# Patient Record
Sex: Female | Born: 1970
Health system: Southern US, Community
[De-identification: ages and names within clinical notes are randomized; demographics above are authoritative.]

## PROBLEM LIST (undated history)

## (undated) DIAGNOSIS — K2270 Barrett's esophagus without dysplasia: Secondary | ICD-10-CM

## (undated) DIAGNOSIS — M5126 Other intervertebral disc displacement, lumbar region: Secondary | ICD-10-CM

## (undated) DIAGNOSIS — D649 Anemia, unspecified: Secondary | ICD-10-CM

## (undated) DIAGNOSIS — M810 Age-related osteoporosis without current pathological fracture: Secondary | ICD-10-CM

## (undated) DIAGNOSIS — G43909 Migraine, unspecified, not intractable, without status migrainosus: Secondary | ICD-10-CM

## (undated) DIAGNOSIS — K449 Diaphragmatic hernia without obstruction or gangrene: Secondary | ICD-10-CM

## (undated) DIAGNOSIS — D689 Coagulation defect, unspecified: Secondary | ICD-10-CM

## (undated) DIAGNOSIS — J45909 Unspecified asthma, uncomplicated: Secondary | ICD-10-CM

## (undated) DIAGNOSIS — M51369 Other intervertebral disc degeneration, lumbar region without mention of lumbar back pain or lower extremity pain: Secondary | ICD-10-CM

## (undated) DIAGNOSIS — F329 Major depressive disorder, single episode, unspecified: Secondary | ICD-10-CM

## (undated) DIAGNOSIS — F909 Attention-deficit hyperactivity disorder, unspecified type: Secondary | ICD-10-CM

## (undated) DIAGNOSIS — I10 Essential (primary) hypertension: Secondary | ICD-10-CM

## (undated) DIAGNOSIS — M1990 Unspecified osteoarthritis, unspecified site: Secondary | ICD-10-CM

## (undated) DIAGNOSIS — B977 Papillomavirus as the cause of diseases classified elsewhere: Secondary | ICD-10-CM

## (undated) DIAGNOSIS — F419 Anxiety disorder, unspecified: Secondary | ICD-10-CM

## (undated) DIAGNOSIS — F32A Depression, unspecified: Secondary | ICD-10-CM

## (undated) DIAGNOSIS — K219 Gastro-esophageal reflux disease without esophagitis: Secondary | ICD-10-CM

## (undated) DIAGNOSIS — N809 Endometriosis, unspecified: Secondary | ICD-10-CM

## (undated) DIAGNOSIS — T7840XA Allergy, unspecified, initial encounter: Secondary | ICD-10-CM

## (undated) DIAGNOSIS — M5136 Other intervertebral disc degeneration, lumbar region: Secondary | ICD-10-CM

## (undated) HISTORY — DX: Unspecified asthma, uncomplicated: J45.909

## (undated) HISTORY — PX: ESSURE TUBAL LIGATION: SUR464

## (undated) HISTORY — PX: HEEL SPUR EXCISION: SHX1733

## (undated) HISTORY — DX: Gastro-esophageal reflux disease without esophagitis: K21.9

## (undated) HISTORY — DX: Migraine, unspecified, not intractable, without status migrainosus: G43.909

## (undated) HISTORY — PX: TONSILLECTOMY: SUR1361

## (undated) HISTORY — DX: Allergy, unspecified, initial encounter: T78.40XA

## (undated) HISTORY — DX: Coagulation defect, unspecified: D68.9

## (undated) HISTORY — DX: Anxiety disorder, unspecified: F41.9

## (undated) HISTORY — DX: Depression, unspecified: F32.A

## (undated) HISTORY — DX: Unspecified osteoarthritis, unspecified site: M19.90

## (undated) HISTORY — DX: Other intervertebral disc degeneration, lumbar region without mention of lumbar back pain or lower extremity pain: M51.369

## (undated) HISTORY — DX: Age-related osteoporosis without current pathological fracture: M81.0

## (undated) HISTORY — DX: Papillomavirus as the cause of diseases classified elsewhere: B97.7

## (undated) HISTORY — DX: Diaphragmatic hernia without obstruction or gangrene: K44.9

## (undated) HISTORY — PX: JOINT REPLACEMENT: SHX530

## (undated) HISTORY — DX: Barrett's esophagus without dysplasia: K22.70

## (undated) HISTORY — DX: Essential (primary) hypertension: I10

## (undated) HISTORY — DX: Anemia, unspecified: D64.9

## (undated) HISTORY — DX: Other intervertebral disc degeneration, lumbar region: M51.36

## (undated) HISTORY — DX: Endometriosis, unspecified: N80.9

## (undated) HISTORY — DX: Major depressive disorder, single episode, unspecified: F32.9

## (undated) HISTORY — DX: Other intervertebral disc displacement, lumbar region: M51.26

## (undated) HISTORY — DX: Attention-deficit hyperactivity disorder, unspecified type: F90.9

## (undated) HISTORY — PX: ARTHROSCOPIC REPAIR ACL: SUR80

## (undated) HISTORY — PX: GASTRIC BYPASS: SHX52

## (undated) HISTORY — PX: CARPAL TUNNEL RELEASE: SHX101

---

## 2012-06-14 DIAGNOSIS — G5600 Carpal tunnel syndrome, unspecified upper limb: Secondary | ICD-10-CM | POA: Insufficient documentation

## 2013-02-17 DIAGNOSIS — M62838 Other muscle spasm: Secondary | ICD-10-CM | POA: Insufficient documentation

## 2013-05-19 DIAGNOSIS — M6528 Calcific tendinitis, other site: Secondary | ICD-10-CM | POA: Insufficient documentation

## 2013-05-19 DIAGNOSIS — M2141 Flat foot [pes planus] (acquired), right foot: Secondary | ICD-10-CM | POA: Insufficient documentation

## 2013-05-19 DIAGNOSIS — M2142 Flat foot [pes planus] (acquired), left foot: Secondary | ICD-10-CM

## 2013-06-19 DIAGNOSIS — K148 Other diseases of tongue: Secondary | ICD-10-CM | POA: Insufficient documentation

## 2013-06-30 DIAGNOSIS — R0600 Dyspnea, unspecified: Secondary | ICD-10-CM | POA: Insufficient documentation

## 2013-06-30 DIAGNOSIS — M25519 Pain in unspecified shoulder: Secondary | ICD-10-CM | POA: Insufficient documentation

## 2013-06-30 DIAGNOSIS — Z6841 Body Mass Index (BMI) 40.0 and over, adult: Secondary | ICD-10-CM

## 2013-06-30 DIAGNOSIS — R609 Edema, unspecified: Secondary | ICD-10-CM | POA: Insufficient documentation

## 2013-08-19 DIAGNOSIS — M25579 Pain in unspecified ankle and joints of unspecified foot: Secondary | ICD-10-CM | POA: Insufficient documentation

## 2014-09-24 DIAGNOSIS — G8929 Other chronic pain: Secondary | ICD-10-CM | POA: Insufficient documentation

## 2014-09-24 DIAGNOSIS — M1712 Unilateral primary osteoarthritis, left knee: Secondary | ICD-10-CM | POA: Insufficient documentation

## 2014-09-24 DIAGNOSIS — M25559 Pain in unspecified hip: Secondary | ICD-10-CM

## 2015-01-04 DIAGNOSIS — Z9884 Bariatric surgery status: Secondary | ICD-10-CM | POA: Insufficient documentation

## 2015-01-04 DIAGNOSIS — Z713 Dietary counseling and surveillance: Secondary | ICD-10-CM | POA: Insufficient documentation

## 2015-01-29 DIAGNOSIS — M1612 Unilateral primary osteoarthritis, left hip: Secondary | ICD-10-CM | POA: Insufficient documentation

## 2016-01-04 DIAGNOSIS — J302 Other seasonal allergic rhinitis: Secondary | ICD-10-CM | POA: Insufficient documentation

## 2016-04-10 DIAGNOSIS — M542 Cervicalgia: Secondary | ICD-10-CM | POA: Diagnosis not present

## 2016-04-10 DIAGNOSIS — R202 Paresthesia of skin: Secondary | ICD-10-CM | POA: Diagnosis not present

## 2016-04-10 DIAGNOSIS — M25559 Pain in unspecified hip: Secondary | ICD-10-CM | POA: Diagnosis not present

## 2016-04-19 DIAGNOSIS — R140 Abdominal distension (gaseous): Secondary | ICD-10-CM | POA: Diagnosis not present

## 2016-04-19 DIAGNOSIS — R110 Nausea: Secondary | ICD-10-CM | POA: Diagnosis not present

## 2016-04-19 DIAGNOSIS — Z9884 Bariatric surgery status: Secondary | ICD-10-CM | POA: Diagnosis not present

## 2016-04-19 DIAGNOSIS — R109 Unspecified abdominal pain: Secondary | ICD-10-CM | POA: Diagnosis not present

## 2016-04-19 DIAGNOSIS — K219 Gastro-esophageal reflux disease without esophagitis: Secondary | ICD-10-CM | POA: Diagnosis not present

## 2016-04-19 DIAGNOSIS — R1084 Generalized abdominal pain: Secondary | ICD-10-CM | POA: Insufficient documentation

## 2016-04-19 DIAGNOSIS — I10 Essential (primary) hypertension: Secondary | ICD-10-CM | POA: Diagnosis not present

## 2016-04-19 DIAGNOSIS — R1031 Right lower quadrant pain: Secondary | ICD-10-CM | POA: Diagnosis not present

## 2016-05-15 DIAGNOSIS — Z9884 Bariatric surgery status: Secondary | ICD-10-CM | POA: Diagnosis not present

## 2016-05-15 DIAGNOSIS — Z48815 Encounter for surgical aftercare following surgery on the digestive system: Secondary | ICD-10-CM | POA: Diagnosis not present

## 2016-05-15 DIAGNOSIS — Z87891 Personal history of nicotine dependence: Secondary | ICD-10-CM | POA: Diagnosis not present

## 2016-05-15 DIAGNOSIS — Z87898 Personal history of other specified conditions: Secondary | ICD-10-CM | POA: Diagnosis not present

## 2016-05-15 DIAGNOSIS — Z6837 Body mass index (BMI) 37.0-37.9, adult: Secondary | ICD-10-CM | POA: Diagnosis not present

## 2016-05-15 DIAGNOSIS — R1013 Epigastric pain: Secondary | ICD-10-CM | POA: Diagnosis not present

## 2016-05-16 DIAGNOSIS — Z9884 Bariatric surgery status: Secondary | ICD-10-CM | POA: Diagnosis not present

## 2016-06-07 DIAGNOSIS — M25562 Pain in left knee: Secondary | ICD-10-CM | POA: Diagnosis not present

## 2016-06-07 DIAGNOSIS — M47812 Spondylosis without myelopathy or radiculopathy, cervical region: Secondary | ICD-10-CM | POA: Diagnosis not present

## 2016-06-07 DIAGNOSIS — M4802 Spinal stenosis, cervical region: Secondary | ICD-10-CM | POA: Diagnosis not present

## 2016-06-07 DIAGNOSIS — M25511 Pain in right shoulder: Secondary | ICD-10-CM | POA: Diagnosis not present

## 2016-06-18 DIAGNOSIS — K429 Umbilical hernia without obstruction or gangrene: Secondary | ICD-10-CM | POA: Diagnosis not present

## 2016-06-18 DIAGNOSIS — R1013 Epigastric pain: Secondary | ICD-10-CM | POA: Diagnosis not present

## 2016-06-18 DIAGNOSIS — R110 Nausea: Secondary | ICD-10-CM | POA: Diagnosis not present

## 2016-06-18 DIAGNOSIS — Z888 Allergy status to other drugs, medicaments and biological substances status: Secondary | ICD-10-CM | POA: Diagnosis not present

## 2016-06-18 DIAGNOSIS — Z8249 Family history of ischemic heart disease and other diseases of the circulatory system: Secondary | ICD-10-CM | POA: Diagnosis not present

## 2016-06-18 DIAGNOSIS — D72829 Elevated white blood cell count, unspecified: Secondary | ICD-10-CM | POA: Diagnosis not present

## 2016-06-18 DIAGNOSIS — Z886 Allergy status to analgesic agent status: Secondary | ICD-10-CM | POA: Diagnosis not present

## 2016-06-18 DIAGNOSIS — Z885 Allergy status to narcotic agent status: Secondary | ICD-10-CM | POA: Diagnosis not present

## 2016-06-18 DIAGNOSIS — R1010 Upper abdominal pain, unspecified: Secondary | ICD-10-CM | POA: Diagnosis not present

## 2016-06-18 DIAGNOSIS — Z79899 Other long term (current) drug therapy: Secondary | ICD-10-CM | POA: Diagnosis not present

## 2016-06-18 DIAGNOSIS — Z91040 Latex allergy status: Secondary | ICD-10-CM | POA: Diagnosis not present

## 2016-06-18 DIAGNOSIS — R933 Abnormal findings on diagnostic imaging of other parts of digestive tract: Secondary | ICD-10-CM | POA: Diagnosis not present

## 2016-06-18 DIAGNOSIS — R109 Unspecified abdominal pain: Secondary | ICD-10-CM | POA: Diagnosis not present

## 2016-06-27 DIAGNOSIS — M4802 Spinal stenosis, cervical region: Secondary | ICD-10-CM | POA: Diagnosis not present

## 2016-06-27 DIAGNOSIS — M5412 Radiculopathy, cervical region: Secondary | ICD-10-CM | POA: Diagnosis not present

## 2016-06-27 DIAGNOSIS — M50323 Other cervical disc degeneration at C6-C7 level: Secondary | ICD-10-CM | POA: Diagnosis not present

## 2016-06-27 DIAGNOSIS — M50322 Other cervical disc degeneration at C5-C6 level: Secondary | ICD-10-CM | POA: Diagnosis not present

## 2016-07-07 DIAGNOSIS — M25512 Pain in left shoulder: Secondary | ICD-10-CM | POA: Diagnosis not present

## 2016-07-07 DIAGNOSIS — M25511 Pain in right shoulder: Secondary | ICD-10-CM | POA: Diagnosis not present

## 2016-07-13 DIAGNOSIS — M5481 Occipital neuralgia: Secondary | ICD-10-CM | POA: Diagnosis not present

## 2016-07-13 DIAGNOSIS — M62838 Other muscle spasm: Secondary | ICD-10-CM | POA: Diagnosis not present

## 2016-07-21 DIAGNOSIS — M25562 Pain in left knee: Secondary | ICD-10-CM | POA: Diagnosis not present

## 2016-07-31 DIAGNOSIS — Q666 Other congenital valgus deformities of feet: Secondary | ICD-10-CM | POA: Diagnosis not present

## 2016-08-04 DIAGNOSIS — M25562 Pain in left knee: Secondary | ICD-10-CM | POA: Diagnosis not present

## 2016-08-04 DIAGNOSIS — M25552 Pain in left hip: Secondary | ICD-10-CM | POA: Diagnosis not present

## 2016-08-04 DIAGNOSIS — M62838 Other muscle spasm: Secondary | ICD-10-CM | POA: Diagnosis not present

## 2016-08-04 DIAGNOSIS — M5412 Radiculopathy, cervical region: Secondary | ICD-10-CM | POA: Diagnosis not present

## 2016-08-07 DIAGNOSIS — Z201 Contact with and (suspected) exposure to tuberculosis: Secondary | ICD-10-CM | POA: Diagnosis not present

## 2016-08-11 DIAGNOSIS — M2141 Flat foot [pes planus] (acquired), right foot: Secondary | ICD-10-CM | POA: Diagnosis not present

## 2016-08-11 DIAGNOSIS — Q666 Other congenital valgus deformities of feet: Secondary | ICD-10-CM | POA: Diagnosis not present

## 2016-09-18 DIAGNOSIS — M9902 Segmental and somatic dysfunction of thoracic region: Secondary | ICD-10-CM | POA: Diagnosis not present

## 2016-09-18 DIAGNOSIS — M9901 Segmental and somatic dysfunction of cervical region: Secondary | ICD-10-CM | POA: Diagnosis not present

## 2016-09-18 DIAGNOSIS — M9903 Segmental and somatic dysfunction of lumbar region: Secondary | ICD-10-CM | POA: Diagnosis not present

## 2016-10-04 DIAGNOSIS — M9906 Segmental and somatic dysfunction of lower extremity: Secondary | ICD-10-CM | POA: Diagnosis not present

## 2016-10-04 DIAGNOSIS — M9902 Segmental and somatic dysfunction of thoracic region: Secondary | ICD-10-CM | POA: Diagnosis not present

## 2016-10-04 DIAGNOSIS — M9903 Segmental and somatic dysfunction of lumbar region: Secondary | ICD-10-CM | POA: Diagnosis not present

## 2016-10-04 DIAGNOSIS — M9901 Segmental and somatic dysfunction of cervical region: Secondary | ICD-10-CM | POA: Diagnosis not present

## 2016-10-13 DIAGNOSIS — R933 Abnormal findings on diagnostic imaging of other parts of digestive tract: Secondary | ICD-10-CM | POA: Diagnosis not present

## 2016-10-13 DIAGNOSIS — Z1231 Encounter for screening mammogram for malignant neoplasm of breast: Secondary | ICD-10-CM | POA: Diagnosis not present

## 2016-10-19 DIAGNOSIS — M9902 Segmental and somatic dysfunction of thoracic region: Secondary | ICD-10-CM | POA: Diagnosis not present

## 2016-10-19 DIAGNOSIS — M1712 Unilateral primary osteoarthritis, left knee: Secondary | ICD-10-CM | POA: Diagnosis not present

## 2016-10-19 DIAGNOSIS — M9901 Segmental and somatic dysfunction of cervical region: Secondary | ICD-10-CM | POA: Diagnosis not present

## 2016-10-19 DIAGNOSIS — M779 Enthesopathy, unspecified: Secondary | ICD-10-CM | POA: Diagnosis not present

## 2016-10-19 DIAGNOSIS — M25562 Pain in left knee: Secondary | ICD-10-CM | POA: Diagnosis not present

## 2016-10-19 DIAGNOSIS — M9903 Segmental and somatic dysfunction of lumbar region: Secondary | ICD-10-CM | POA: Diagnosis not present

## 2016-10-19 DIAGNOSIS — M25462 Effusion, left knee: Secondary | ICD-10-CM | POA: Diagnosis not present

## 2016-10-19 DIAGNOSIS — M9906 Segmental and somatic dysfunction of lower extremity: Secondary | ICD-10-CM | POA: Diagnosis not present

## 2016-10-26 DIAGNOSIS — M9901 Segmental and somatic dysfunction of cervical region: Secondary | ICD-10-CM | POA: Diagnosis not present

## 2016-10-26 DIAGNOSIS — M9906 Segmental and somatic dysfunction of lower extremity: Secondary | ICD-10-CM | POA: Diagnosis not present

## 2016-10-26 DIAGNOSIS — M9902 Segmental and somatic dysfunction of thoracic region: Secondary | ICD-10-CM | POA: Diagnosis not present

## 2016-10-26 DIAGNOSIS — M9903 Segmental and somatic dysfunction of lumbar region: Secondary | ICD-10-CM | POA: Diagnosis not present

## 2016-11-16 DIAGNOSIS — M9906 Segmental and somatic dysfunction of lower extremity: Secondary | ICD-10-CM | POA: Diagnosis not present

## 2016-11-16 DIAGNOSIS — M9902 Segmental and somatic dysfunction of thoracic region: Secondary | ICD-10-CM | POA: Diagnosis not present

## 2016-11-16 DIAGNOSIS — M9901 Segmental and somatic dysfunction of cervical region: Secondary | ICD-10-CM | POA: Diagnosis not present

## 2016-11-16 DIAGNOSIS — M9903 Segmental and somatic dysfunction of lumbar region: Secondary | ICD-10-CM | POA: Diagnosis not present

## 2016-11-20 DIAGNOSIS — M1612 Unilateral primary osteoarthritis, left hip: Secondary | ICD-10-CM | POA: Diagnosis not present

## 2016-11-20 DIAGNOSIS — M160 Bilateral primary osteoarthritis of hip: Secondary | ICD-10-CM | POA: Diagnosis not present

## 2016-11-20 DIAGNOSIS — M1732 Unilateral post-traumatic osteoarthritis, left knee: Secondary | ICD-10-CM | POA: Diagnosis not present

## 2016-11-20 DIAGNOSIS — M25552 Pain in left hip: Secondary | ICD-10-CM | POA: Diagnosis not present

## 2016-11-20 DIAGNOSIS — Z6837 Body mass index (BMI) 37.0-37.9, adult: Secondary | ICD-10-CM | POA: Diagnosis not present

## 2016-11-22 DIAGNOSIS — M9903 Segmental and somatic dysfunction of lumbar region: Secondary | ICD-10-CM | POA: Diagnosis not present

## 2016-11-22 DIAGNOSIS — M9901 Segmental and somatic dysfunction of cervical region: Secondary | ICD-10-CM | POA: Diagnosis not present

## 2016-11-22 DIAGNOSIS — M9902 Segmental and somatic dysfunction of thoracic region: Secondary | ICD-10-CM | POA: Diagnosis not present

## 2016-11-22 DIAGNOSIS — M9906 Segmental and somatic dysfunction of lower extremity: Secondary | ICD-10-CM | POA: Diagnosis not present

## 2016-11-30 DIAGNOSIS — M9902 Segmental and somatic dysfunction of thoracic region: Secondary | ICD-10-CM | POA: Diagnosis not present

## 2016-11-30 DIAGNOSIS — M9901 Segmental and somatic dysfunction of cervical region: Secondary | ICD-10-CM | POA: Diagnosis not present

## 2016-11-30 DIAGNOSIS — M9906 Segmental and somatic dysfunction of lower extremity: Secondary | ICD-10-CM | POA: Diagnosis not present

## 2016-11-30 DIAGNOSIS — M9903 Segmental and somatic dysfunction of lumbar region: Secondary | ICD-10-CM | POA: Diagnosis not present

## 2016-12-05 DIAGNOSIS — M9902 Segmental and somatic dysfunction of thoracic region: Secondary | ICD-10-CM | POA: Diagnosis not present

## 2016-12-05 DIAGNOSIS — M9901 Segmental and somatic dysfunction of cervical region: Secondary | ICD-10-CM | POA: Diagnosis not present

## 2016-12-05 DIAGNOSIS — M9903 Segmental and somatic dysfunction of lumbar region: Secondary | ICD-10-CM | POA: Diagnosis not present

## 2016-12-05 DIAGNOSIS — M9906 Segmental and somatic dysfunction of lower extremity: Secondary | ICD-10-CM | POA: Diagnosis not present

## 2016-12-08 DIAGNOSIS — M5481 Occipital neuralgia: Secondary | ICD-10-CM | POA: Diagnosis not present

## 2016-12-08 DIAGNOSIS — M62838 Other muscle spasm: Secondary | ICD-10-CM | POA: Diagnosis not present

## 2016-12-21 LAB — HM PAP SMEAR: HM Pap smear: NORMAL

## 2016-12-21 LAB — HM HIV SCREENING LAB: HM HIV Screening: NEGATIVE

## 2016-12-22 DIAGNOSIS — Z1151 Encounter for screening for human papillomavirus (HPV): Secondary | ICD-10-CM | POA: Diagnosis not present

## 2016-12-22 DIAGNOSIS — F419 Anxiety disorder, unspecified: Secondary | ICD-10-CM | POA: Diagnosis not present

## 2016-12-22 DIAGNOSIS — Z9884 Bariatric surgery status: Secondary | ICD-10-CM | POA: Diagnosis not present

## 2016-12-22 DIAGNOSIS — Z0000 Encounter for general adult medical examination without abnormal findings: Secondary | ICD-10-CM | POA: Diagnosis not present

## 2016-12-22 DIAGNOSIS — Z118 Encounter for screening for other infectious and parasitic diseases: Secondary | ICD-10-CM | POA: Diagnosis not present

## 2016-12-22 DIAGNOSIS — R51 Headache: Secondary | ICD-10-CM | POA: Diagnosis not present

## 2016-12-22 DIAGNOSIS — Z124 Encounter for screening for malignant neoplasm of cervix: Secondary | ICD-10-CM | POA: Diagnosis not present

## 2016-12-22 DIAGNOSIS — Z6836 Body mass index (BMI) 36.0-36.9, adult: Secondary | ICD-10-CM | POA: Diagnosis not present

## 2016-12-22 DIAGNOSIS — D509 Iron deficiency anemia, unspecified: Secondary | ICD-10-CM | POA: Diagnosis not present

## 2016-12-22 DIAGNOSIS — Z113 Encounter for screening for infections with a predominantly sexual mode of transmission: Secondary | ICD-10-CM | POA: Diagnosis not present

## 2016-12-22 DIAGNOSIS — M25562 Pain in left knee: Secondary | ICD-10-CM | POA: Diagnosis not present

## 2016-12-22 DIAGNOSIS — N920 Excessive and frequent menstruation with regular cycle: Secondary | ICD-10-CM | POA: Diagnosis not present

## 2016-12-22 DIAGNOSIS — Z13220 Encounter for screening for lipoid disorders: Secondary | ICD-10-CM | POA: Diagnosis not present

## 2016-12-22 DIAGNOSIS — E559 Vitamin D deficiency, unspecified: Secondary | ICD-10-CM | POA: Diagnosis not present

## 2016-12-26 DIAGNOSIS — M9903 Segmental and somatic dysfunction of lumbar region: Secondary | ICD-10-CM | POA: Diagnosis not present

## 2016-12-26 DIAGNOSIS — M9902 Segmental and somatic dysfunction of thoracic region: Secondary | ICD-10-CM | POA: Diagnosis not present

## 2016-12-26 DIAGNOSIS — M9906 Segmental and somatic dysfunction of lower extremity: Secondary | ICD-10-CM | POA: Diagnosis not present

## 2016-12-26 DIAGNOSIS — M9901 Segmental and somatic dysfunction of cervical region: Secondary | ICD-10-CM | POA: Diagnosis not present

## 2017-01-10 DIAGNOSIS — M9901 Segmental and somatic dysfunction of cervical region: Secondary | ICD-10-CM | POA: Diagnosis not present

## 2017-01-10 DIAGNOSIS — M9906 Segmental and somatic dysfunction of lower extremity: Secondary | ICD-10-CM | POA: Diagnosis not present

## 2017-01-10 DIAGNOSIS — M9903 Segmental and somatic dysfunction of lumbar region: Secondary | ICD-10-CM | POA: Diagnosis not present

## 2017-01-10 DIAGNOSIS — M9902 Segmental and somatic dysfunction of thoracic region: Secondary | ICD-10-CM | POA: Diagnosis not present

## 2017-01-11 DIAGNOSIS — M47812 Spondylosis without myelopathy or radiculopathy, cervical region: Secondary | ICD-10-CM | POA: Diagnosis not present

## 2017-01-11 DIAGNOSIS — M5412 Radiculopathy, cervical region: Secondary | ICD-10-CM | POA: Diagnosis not present

## 2017-01-19 DIAGNOSIS — F419 Anxiety disorder, unspecified: Secondary | ICD-10-CM | POA: Diagnosis not present

## 2017-01-19 DIAGNOSIS — Z79899 Other long term (current) drug therapy: Secondary | ICD-10-CM | POA: Diagnosis not present

## 2017-01-19 DIAGNOSIS — Z9884 Bariatric surgery status: Secondary | ICD-10-CM | POA: Diagnosis not present

## 2017-01-19 DIAGNOSIS — R531 Weakness: Secondary | ICD-10-CM | POA: Diagnosis not present

## 2017-01-19 DIAGNOSIS — R5383 Other fatigue: Secondary | ICD-10-CM | POA: Diagnosis not present

## 2017-01-19 DIAGNOSIS — I10 Essential (primary) hypertension: Secondary | ICD-10-CM | POA: Diagnosis not present

## 2017-01-19 DIAGNOSIS — R42 Dizziness and giddiness: Secondary | ICD-10-CM | POA: Diagnosis not present

## 2017-01-19 DIAGNOSIS — D509 Iron deficiency anemia, unspecified: Secondary | ICD-10-CM | POA: Diagnosis not present

## 2017-01-19 DIAGNOSIS — Z87891 Personal history of nicotine dependence: Secondary | ICD-10-CM | POA: Diagnosis not present

## 2017-01-19 DIAGNOSIS — K219 Gastro-esophageal reflux disease without esophagitis: Secondary | ICD-10-CM | POA: Diagnosis not present

## 2017-01-19 DIAGNOSIS — E669 Obesity, unspecified: Secondary | ICD-10-CM | POA: Diagnosis not present

## 2017-01-19 DIAGNOSIS — J45909 Unspecified asthma, uncomplicated: Secondary | ICD-10-CM | POA: Diagnosis not present

## 2017-01-23 DIAGNOSIS — M1990 Unspecified osteoarthritis, unspecified site: Secondary | ICD-10-CM | POA: Diagnosis not present

## 2017-01-23 DIAGNOSIS — Z9884 Bariatric surgery status: Secondary | ICD-10-CM | POA: Diagnosis not present

## 2017-01-23 DIAGNOSIS — J45909 Unspecified asthma, uncomplicated: Secondary | ICD-10-CM | POA: Diagnosis not present

## 2017-01-23 DIAGNOSIS — M25561 Pain in right knee: Secondary | ICD-10-CM | POA: Diagnosis not present

## 2017-01-23 DIAGNOSIS — Z87891 Personal history of nicotine dependence: Secondary | ICD-10-CM | POA: Diagnosis not present

## 2017-01-23 DIAGNOSIS — I10 Essential (primary) hypertension: Secondary | ICD-10-CM | POA: Diagnosis not present

## 2017-01-23 DIAGNOSIS — Z886 Allergy status to analgesic agent status: Secondary | ICD-10-CM | POA: Diagnosis not present

## 2017-01-23 DIAGNOSIS — M1711 Unilateral primary osteoarthritis, right knee: Secondary | ICD-10-CM | POA: Diagnosis not present

## 2017-01-23 DIAGNOSIS — D509 Iron deficiency anemia, unspecified: Secondary | ICD-10-CM | POA: Diagnosis not present

## 2017-01-23 DIAGNOSIS — R5383 Other fatigue: Secondary | ICD-10-CM | POA: Diagnosis not present

## 2017-01-23 DIAGNOSIS — Z885 Allergy status to narcotic agent status: Secondary | ICD-10-CM | POA: Diagnosis not present

## 2017-01-23 DIAGNOSIS — K219 Gastro-esophageal reflux disease without esophagitis: Secondary | ICD-10-CM | POA: Diagnosis not present

## 2017-02-03 DIAGNOSIS — R252 Cramp and spasm: Secondary | ICD-10-CM | POA: Diagnosis not present

## 2017-02-03 DIAGNOSIS — M79605 Pain in left leg: Secondary | ICD-10-CM | POA: Diagnosis not present

## 2017-02-03 DIAGNOSIS — Z87891 Personal history of nicotine dependence: Secondary | ICD-10-CM | POA: Diagnosis not present

## 2017-02-03 DIAGNOSIS — M1990 Unspecified osteoarthritis, unspecified site: Secondary | ICD-10-CM | POA: Diagnosis not present

## 2017-02-03 DIAGNOSIS — M79604 Pain in right leg: Secondary | ICD-10-CM | POA: Diagnosis not present

## 2017-02-03 DIAGNOSIS — Z886 Allergy status to analgesic agent status: Secondary | ICD-10-CM | POA: Diagnosis not present

## 2017-02-03 DIAGNOSIS — I10 Essential (primary) hypertension: Secondary | ICD-10-CM | POA: Diagnosis not present

## 2017-02-03 DIAGNOSIS — Z79899 Other long term (current) drug therapy: Secondary | ICD-10-CM | POA: Diagnosis not present

## 2017-02-03 DIAGNOSIS — M79661 Pain in right lower leg: Secondary | ICD-10-CM | POA: Diagnosis not present

## 2017-02-03 DIAGNOSIS — Z885 Allergy status to narcotic agent status: Secondary | ICD-10-CM | POA: Diagnosis not present

## 2017-02-03 DIAGNOSIS — J45909 Unspecified asthma, uncomplicated: Secondary | ICD-10-CM | POA: Diagnosis not present

## 2017-02-03 DIAGNOSIS — Z6836 Body mass index (BMI) 36.0-36.9, adult: Secondary | ICD-10-CM | POA: Diagnosis not present

## 2017-02-03 DIAGNOSIS — M79662 Pain in left lower leg: Secondary | ICD-10-CM | POA: Diagnosis not present

## 2017-02-03 DIAGNOSIS — K219 Gastro-esophageal reflux disease without esophagitis: Secondary | ICD-10-CM | POA: Diagnosis not present

## 2017-02-03 DIAGNOSIS — Z9884 Bariatric surgery status: Secondary | ICD-10-CM | POA: Diagnosis not present

## 2017-02-03 DIAGNOSIS — M50322 Other cervical disc degeneration at C5-C6 level: Secondary | ICD-10-CM | POA: Diagnosis not present

## 2017-02-03 DIAGNOSIS — M9971 Connective tissue and disc stenosis of intervertebral foramina of cervical region: Secondary | ICD-10-CM | POA: Diagnosis not present

## 2017-02-03 DIAGNOSIS — Z91040 Latex allergy status: Secondary | ICD-10-CM | POA: Diagnosis not present

## 2017-02-06 DIAGNOSIS — D509 Iron deficiency anemia, unspecified: Secondary | ICD-10-CM | POA: Diagnosis not present

## 2017-02-06 DIAGNOSIS — N920 Excessive and frequent menstruation with regular cycle: Secondary | ICD-10-CM | POA: Diagnosis not present

## 2017-02-06 DIAGNOSIS — Z87891 Personal history of nicotine dependence: Secondary | ICD-10-CM | POA: Diagnosis not present

## 2017-02-06 DIAGNOSIS — Z79899 Other long term (current) drug therapy: Secondary | ICD-10-CM | POA: Diagnosis not present

## 2017-02-06 DIAGNOSIS — Z6836 Body mass index (BMI) 36.0-36.9, adult: Secondary | ICD-10-CM | POA: Diagnosis not present

## 2017-02-06 DIAGNOSIS — Z9884 Bariatric surgery status: Secondary | ICD-10-CM | POA: Diagnosis not present

## 2017-02-12 DIAGNOSIS — M9901 Segmental and somatic dysfunction of cervical region: Secondary | ICD-10-CM | POA: Diagnosis not present

## 2017-02-12 DIAGNOSIS — S134XXA Sprain of ligaments of cervical spine, initial encounter: Secondary | ICD-10-CM | POA: Diagnosis not present

## 2017-02-12 DIAGNOSIS — M9903 Segmental and somatic dysfunction of lumbar region: Secondary | ICD-10-CM | POA: Diagnosis not present

## 2017-02-12 DIAGNOSIS — M9902 Segmental and somatic dysfunction of thoracic region: Secondary | ICD-10-CM | POA: Diagnosis not present

## 2017-02-13 DIAGNOSIS — J45909 Unspecified asthma, uncomplicated: Secondary | ICD-10-CM | POA: Diagnosis not present

## 2017-02-13 DIAGNOSIS — N920 Excessive and frequent menstruation with regular cycle: Secondary | ICD-10-CM | POA: Diagnosis not present

## 2017-02-13 DIAGNOSIS — F419 Anxiety disorder, unspecified: Secondary | ICD-10-CM | POA: Diagnosis not present

## 2017-02-13 DIAGNOSIS — M62838 Other muscle spasm: Secondary | ICD-10-CM | POA: Diagnosis not present

## 2017-02-13 DIAGNOSIS — Z8249 Family history of ischemic heart disease and other diseases of the circulatory system: Secondary | ICD-10-CM | POA: Diagnosis not present

## 2017-02-13 DIAGNOSIS — I10 Essential (primary) hypertension: Secondary | ICD-10-CM | POA: Diagnosis not present

## 2017-02-13 DIAGNOSIS — M79606 Pain in leg, unspecified: Secondary | ICD-10-CM | POA: Diagnosis not present

## 2017-02-13 DIAGNOSIS — Z886 Allergy status to analgesic agent status: Secondary | ICD-10-CM | POA: Diagnosis not present

## 2017-02-13 DIAGNOSIS — Z825 Family history of asthma and other chronic lower respiratory diseases: Secondary | ICD-10-CM | POA: Diagnosis not present

## 2017-02-13 DIAGNOSIS — D509 Iron deficiency anemia, unspecified: Secondary | ICD-10-CM | POA: Diagnosis not present

## 2017-02-13 DIAGNOSIS — Z885 Allergy status to narcotic agent status: Secondary | ICD-10-CM | POA: Diagnosis not present

## 2017-02-13 DIAGNOSIS — Z9884 Bariatric surgery status: Secondary | ICD-10-CM | POA: Diagnosis not present

## 2017-02-13 DIAGNOSIS — Z87891 Personal history of nicotine dependence: Secondary | ICD-10-CM | POA: Diagnosis not present

## 2017-02-13 DIAGNOSIS — K219 Gastro-esophageal reflux disease without esophagitis: Secondary | ICD-10-CM | POA: Diagnosis not present

## 2017-02-13 DIAGNOSIS — M25551 Pain in right hip: Secondary | ICD-10-CM | POA: Diagnosis not present

## 2017-02-14 DIAGNOSIS — M9902 Segmental and somatic dysfunction of thoracic region: Secondary | ICD-10-CM | POA: Diagnosis not present

## 2017-02-14 DIAGNOSIS — M9903 Segmental and somatic dysfunction of lumbar region: Secondary | ICD-10-CM | POA: Diagnosis not present

## 2017-02-14 DIAGNOSIS — M9901 Segmental and somatic dysfunction of cervical region: Secondary | ICD-10-CM | POA: Diagnosis not present

## 2017-02-14 DIAGNOSIS — S134XXA Sprain of ligaments of cervical spine, initial encounter: Secondary | ICD-10-CM | POA: Diagnosis not present

## 2017-02-15 DIAGNOSIS — D509 Iron deficiency anemia, unspecified: Secondary | ICD-10-CM | POA: Diagnosis not present

## 2017-02-27 ENCOUNTER — Encounter: Payer: Self-pay | Admitting: Family Medicine

## 2017-02-27 ENCOUNTER — Ambulatory Visit (INDEPENDENT_AMBULATORY_CARE_PROVIDER_SITE_OTHER): Payer: Self-pay | Admitting: Family Medicine

## 2017-02-27 VITALS — BP 132/84 | HR 144

## 2017-02-27 DIAGNOSIS — I471 Supraventricular tachycardia: Secondary | ICD-10-CM | POA: Insufficient documentation

## 2017-02-27 DIAGNOSIS — D508 Other iron deficiency anemias: Secondary | ICD-10-CM

## 2017-02-27 DIAGNOSIS — D509 Iron deficiency anemia, unspecified: Secondary | ICD-10-CM | POA: Insufficient documentation

## 2017-02-27 NOTE — Assessment & Plan Note (Signed)
Improving with iron infusions.

## 2017-02-27 NOTE — Assessment & Plan Note (Signed)
Reviewed labs EKG normal sinus rhythm CBC hemoglobin 12 urinalysis no dehydration blood pressure normal. No use of other supplements or caffeine-like medications. Nonspecific tachycardia will observe for now. If symptoms persist may consider restarting propanolol.

## 2017-02-27 NOTE — Progress Notes (Signed)
   BP 132/84 (BP Location: Left Arm)   Pulse (!) 144   SpO2 99%    Subjective:    Patient ID: Nancy Sanchez, female    DOB: [birth year], 46 y.o.   MRN: [id number]  HPI: Nancy Sanchez is a 46 y.o. female  Chief Complaint  Patient presents with  . Tachycardia  Patient with spontaneously developing marked tachycardia feeling lightheaded and woozy. This happened while drawing blood from the patient. After completing blood draw patient sat and rested for about 15 minutes with continued tachycardia and feeling lightheaded and woozy. On further exam noted to continue to be tachycardic blood pressure normal with normal EKG except for tachycardia. Patient had been on propanolol but stopped about 2 weeks ago. Patient not taking any supplements no excessive caffeine or other substance has been drinking a lot of water. Has been having problems with iron deficiency anemia status post gastric bypass several years ago And is been anemic and getting iron infusions. Last was about 2 weeks ago with hemoglobin around 10. No other symptoms frequency urgency dysuria cough cold fever or chills.  Relevant past medical, surgical, family and social history reviewed and updated as indicated. Interim medical history since our last visit reviewed. Allergies and medications reviewed and updated.  Review of Systems  Constitutional: Negative.   Respiratory: Negative.   Cardiovascular: Negative.     Per HPI unless specifically indicated above     Objective:    BP 132/84 (BP Location: Left Arm)   Pulse (!) 144   SpO2 99%   Wt Readings from Last 3 Encounters:  No data found for Wt    Physical Exam  Constitutional: She is oriented to person, place, and time. She appears well-developed and well-nourished.  HENT:  Head: Normocephalic and atraumatic.  Eyes: Conjunctivae and EOM are normal.  Neck: Normal range of motion.  Cardiovascular: Normal rate, regular rhythm and normal heart sounds.     Pulmonary/Chest: Effort normal and breath sounds normal.  Musculoskeletal: Normal range of motion.  Neurological: She is alert and oriented to person, place, and time.  Skin: No erythema.  Psychiatric: She has a normal mood and affect. Her behavior is normal. Judgment and thought content normal.   reviewed past medical history notable for gastric bypass surgery No results found for this or any previous visit.    Assessment & Plan:   Problem List Items Addressed This Visit      Cardiovascular and Mediastinum   Paroxysmal supraventricular tachycardia (HCC) - Primary    Reviewed labs EKG normal sinus rhythm CBC hemoglobin 12 urinalysis no dehydration blood pressure normal. No use of other supplements or caffeine-like medications. Nonspecific tachycardia will observe for now. If symptoms persist may consider restarting propanolol.      Relevant Medications   propranolol ER (INDERAL LA) 80 MG 24 hr capsule   Other Relevant Orders   EKG 12-Lead (Completed)   CBC With Differential/Platelet   Urinalysis, Routine w reflex microscopic   Basic metabolic panel     Other   Iron deficiency anemia    Improving with iron infusions.      Relevant Orders   CBC With Differential/Platelet   Basic metabolic panel       Follow up plan: Return if symptoms worsen or fail to improve.

## 2017-02-28 ENCOUNTER — Encounter: Payer: Self-pay | Admitting: Family Medicine

## 2017-02-28 LAB — CBC WITH DIFFERENTIAL/PLATELET
Hematocrit: 40.3 % (ref 34.0–46.6)
Hemoglobin: 12.1 g/dL (ref 11.1–15.9)
Lymphocytes Absolute: 1.4 10*3/uL (ref 0.7–3.1)
Lymphs: 17 %
MCH: 24.3 pg — ABNORMAL LOW (ref 26.6–33.0)
MCHC: 30 g/dL — ABNORMAL LOW (ref 31.5–35.7)
MCV: 81 fL (ref 79–97)
MID (Absolute): 0.9 10*3/uL (ref 0.1–1.6)
MID: 10 %
Neutrophils Absolute: 6.1 10*3/uL (ref 1.4–7.0)
Neutrophils: 73 %
Platelets: 287 10*3/uL (ref 150–379)
RBC: 4.98 x10E6/uL (ref 3.77–5.28)
RDW: 20.9 % — ABNORMAL HIGH (ref 12.3–15.4)
WBC: 8.4 10*3/uL (ref 3.4–10.8)

## 2017-02-28 LAB — BASIC METABOLIC PANEL
BUN/Creatinine Ratio: 16 (ref 9–23)
BUN: 13 mg/dL (ref 6–24)
CO2: 24 mmol/L (ref 18–29)
Calcium: 9.8 mg/dL (ref 8.7–10.2)
Chloride: 102 mmol/L (ref 96–106)
Creatinine, Ser: 0.79 mg/dL (ref 0.57–1.00)
GFR calc Af Amer: 105 mL/min/{1.73_m2} (ref >59)
GFR calc non Af Amer: 91 mL/min/{1.73_m2} (ref >59)
Glucose: 78 mg/dL (ref 65–99)
Potassium: 4.2 mmol/L (ref 3.5–5.2)
Sodium: 143 mmol/L (ref 134–144)

## 2017-02-28 LAB — URINALYSIS, ROUTINE W REFLEX MICROSCOPIC
Bilirubin, UA: NEGATIVE
Glucose, UA: NEGATIVE
Ketones, UA: NEGATIVE
Nitrite, UA: NEGATIVE
Protein, UA: NEGATIVE
Specific Gravity, UA: 1.01 (ref 1.005–1.030)
Urobilinogen, Ur: 0.2 mg/dL (ref 0.2–1.0)
pH, UA: 5.5 (ref 5.0–7.5)

## 2017-02-28 LAB — MICROSCOPIC EXAMINATION
Bacteria, UA: NONE SEEN
Epithelial Cells (non renal): >10 /hpf — AB (ref 0–10)

## 2017-04-19 ENCOUNTER — Ambulatory Visit: Payer: Self-pay | Admitting: Family Medicine

## 2017-05-01 ENCOUNTER — Encounter: Payer: Self-pay | Admitting: Family Medicine

## 2017-05-01 ENCOUNTER — Ambulatory Visit (INDEPENDENT_AMBULATORY_CARE_PROVIDER_SITE_OTHER): Payer: Self-pay | Admitting: Family Medicine

## 2017-05-01 VITALS — BP 117/73 | HR 73 | Temp 98.8°F | Ht 63.0 in | Wt 205.0 lb

## 2017-05-01 DIAGNOSIS — Z9884 Bariatric surgery status: Secondary | ICD-10-CM | POA: Insufficient documentation

## 2017-05-01 DIAGNOSIS — G43909 Migraine, unspecified, not intractable, without status migrainosus: Secondary | ICD-10-CM | POA: Insufficient documentation

## 2017-05-01 DIAGNOSIS — J45909 Unspecified asthma, uncomplicated: Secondary | ICD-10-CM

## 2017-05-01 DIAGNOSIS — F419 Anxiety disorder, unspecified: Secondary | ICD-10-CM

## 2017-05-01 DIAGNOSIS — I10 Essential (primary) hypertension: Secondary | ICD-10-CM | POA: Insufficient documentation

## 2017-05-01 DIAGNOSIS — F32A Depression, unspecified: Secondary | ICD-10-CM | POA: Insufficient documentation

## 2017-05-01 DIAGNOSIS — D508 Other iron deficiency anemias: Secondary | ICD-10-CM

## 2017-05-01 DIAGNOSIS — K219 Gastro-esophageal reflux disease without esophagitis: Secondary | ICD-10-CM | POA: Insufficient documentation

## 2017-05-01 DIAGNOSIS — G43709 Chronic migraine without aura, not intractable, without status migrainosus: Secondary | ICD-10-CM

## 2017-05-01 DIAGNOSIS — M810 Age-related osteoporosis without current pathological fracture: Secondary | ICD-10-CM | POA: Insufficient documentation

## 2017-05-01 DIAGNOSIS — I471 Supraventricular tachycardia, unspecified: Secondary | ICD-10-CM

## 2017-05-01 DIAGNOSIS — K4090 Unilateral inguinal hernia, without obstruction or gangrene, not specified as recurrent: Secondary | ICD-10-CM

## 2017-05-01 DIAGNOSIS — M1990 Unspecified osteoarthritis, unspecified site: Secondary | ICD-10-CM | POA: Insufficient documentation

## 2017-05-01 DIAGNOSIS — F339 Major depressive disorder, recurrent, unspecified: Secondary | ICD-10-CM

## 2017-05-01 DIAGNOSIS — D649 Anemia, unspecified: Secondary | ICD-10-CM | POA: Insufficient documentation

## 2017-05-01 DIAGNOSIS — F329 Major depressive disorder, single episode, unspecified: Secondary | ICD-10-CM | POA: Insufficient documentation

## 2017-05-01 MED ORDER — BUPROPION HCL 100 MG PO TABS: 100.0000 mg | ORAL_TABLET | Freq: Two times a day (BID) | ORAL | 1 refills | Status: DC

## 2017-05-01 MED ORDER — ESCITALOPRAM OXALATE 20 MG PO TABS: 20.0000 mg | ORAL_TABLET | Freq: Every day | ORAL | 1 refills | Status: DC

## 2017-05-01 MED ORDER — ALBUTEROL SULFATE HFA 108 (90 BASE) MCG/ACT IN AERS: INHALATION_SPRAY | RESPIRATORY_TRACT | 2 refills | Status: DC

## 2017-05-01 MED ORDER — ATENOLOL 50 MG PO TABS: 50.0000 mg | ORAL_TABLET | Freq: Every day | ORAL | 1 refills | Status: DC

## 2017-05-01 MED ORDER — RIZATRIPTAN BENZOATE 10 MG PO TABS: 10.0000 mg | ORAL_TABLET | ORAL | 12 refills | Status: DC | PRN

## 2017-05-01 NOTE — Assessment & Plan Note (Signed)
Exacerbated because she's been off her meds. Restart meds and recheck 1 month.  

## 2017-05-01 NOTE — Assessment & Plan Note (Addendum)
Under good control. Propranolol too expensive, will change to atenolol. Continue to monitor.

## 2017-05-01 NOTE — Assessment & Plan Note (Signed)
Propranolol too expensive. Will change to atenolol and recheck 1 month. Refills given.

## 2017-05-01 NOTE — Assessment & Plan Note (Signed)
Refill of albuterol given today. Call with any concerns.

## 2017-05-01 NOTE — Progress Notes (Signed)
BP 117/73 (BP Location: Left Arm, Patient Position: Sitting, Cuff Size: Large)   Pulse 73   Temp 98.8 F (37.1 C)   Ht 5\' 3"  (1.6 m)   Wt 205 lb (93 kg)   LMP 04/12/2017 (Exact Date)   SpO2 99%   BMI 36.31 kg/m    Subjective:    Patient ID: Nancy Sanchez, female    DOB: October 07, 1971, 46 y.o.   MRN: [id number]  HPI: Nancy KinnierKimberly Renee Khokhar is a 46 y.o. female who presents today to establish care after an acute visit with another provider in the office. Has been off of all her medicine for several months except her lexapro  Chief Complaint  Patient presents with  . Mass   LUMP- seems to change in size, she can move it, [facility]  FINDINGS:  LOWER CHEST: Unremarkable. ABDOMEN/PELVIS: HEPATOBILIARY: Unremarkable liver. No biliary ductal dilatation. Gallbladder is unremarkable. PANCREAS: Unremarkable. SPLEEN: Unremarkable. ADRENAL GLANDS: Unremarkable. KIDNEYS/URETERS: Unremarkable. BLADDER: Unremarkable. BOWEL/PERITONEUM/RETROPERITONEUM: Sequelae of gastric bypass. No bowel obstruction. No acute inflammatory process. No ascites. No intraperitoneal free air. Unremarkable appendix. VASCULATURE: Abdominal aorta within normal limits for patient's age. Unremarkable inferior vena cava. LYMPH NODES: No adenopathy. REPRODUCTIVE ORGANS: Sequelae of tubal ligation. Otherwise, within normal limits for patient's age. BONES/SOFT TISSUES: Tiny left fat-containing hernia. Small fat-containing periumbilical hernia. Mild subcutaneous edema. No acute osseous abnormality.  Duration: At least 5 months Location: L groin Onset: sudden Painful: yes Discomfort: yes Status:  changing- goes from being the size of a plum to about the size of a grape Trauma: no Redness: no Bruising: no Recent infection: no Swollen lymph nodes: no Requesting removal: no History of cancer: no History of the same: no Associated signs and symptoms: none  ANEMIA- [facility], last had the infusion about a month and a half ago. Feeling pretty good right now Anemia status: better Etiology of anemia: iron deficiency Duration of anemia treatment: chronic Compliance with treatment: excellent compliance Iron supplementation side effects: no Severity of anemia: severe Fatigue: yes Decreased exercise tolerance: no  Dyspnea on exertion: no Palpitations: no Bleeding: no Pica: no  ANXIETY/STRESS Duration:exacerbated- son just got deployed, feels good when she takes her medicine every day Anxious mood: yes  Excessive worrying: yes Irritability: yes  Sweating: no Nausea: yes Palpitations:yes Hyperventilation: no Panic attacks: yes Agoraphobia: no  Obscessions/compulsions: yes Depressed mood: yes Depression screen PHQ 2/9 05/01/2017  Decreased Interest 1  Down, Depressed, Hopeless 1  PHQ - 2 Score 2  Altered sleeping 1  Tired, decreased energy 1  Change in appetite 0  Feeling bad or failure about yourself  0  Trouble concentrating 1  Moving slowly or fidgety/restless 0  Suicidal thoughts 0  PHQ-9 Score 5   GAD 7 : Generalized Anxiety Score 05/01/2017  Nervous, Anxious, on Edge 2  Control/stop worrying 2  Worry too much - different things 2  Trouble relaxing 2  Restless 2  Easily annoyed or irritable 1  Afraid - awful might happen 2  Total GAD 7 Score 13  Anxiety Difficulty Not difficult at all   Anhedonia: no Weight changes: no Insomnia: no   Hypersomnia: no Fatigue/loss of energy: yes Feelings of worthlessness: yes Feelings of guilt: yes Impaired concentration/indecisiveness: yes Suicidal ideations: no  Crying spells: yes Recent Stressors/Life Changes: no   Relationship problems: no   Family stress: yes     Financial stress: no    Job stress:  no    Recent death/loss: no   Active Ambulatory Problems    Diagnosis Date Noted  . Iron deficiency anemia 02/27/2017  . Paroxysmal supraventricular tachycardia (HCC)  02/27/2017  . Anxiety   . Arthritis   . Asthma   . Depression   . GERD (gastroesophageal reflux disease)   . Hypertension   . Osteoporosis   . History of Roux-en-Y gastric bypass 05/01/2017  . Migraines 05/01/2017   Resolved Ambulatory Problems    Diagnosis Date Noted  . Anemia    Past Medical History:  Diagnosis Date  . ADHD   . Allergy   . Anemia   . Anxiety   . Arthritis   . Asthma   . Barrett's esophagus   . Bulging lumbar disc   . Depression   . Endometriosis   . GERD (gastroesophageal reflux disease)   . Hernia, hiatal   . HPV in female   . Hypertension   . Migraine   . Osteoporosis    Past Surgical History:  Procedure Laterality Date  . ARTHROSCOPIC REPAIR ACL Left   . CARPAL TUNNEL RELEASE Bilateral   . ESSURE TUBAL LIGATION    . GASTRIC BYPASS    . HEEL SPUR EXCISION Left   . TONSILLECTOMY       Outpatient Encounter Prescriptions as of 05/01/2017  Medication Sig  . acetaminophen (TYLENOL) 650 MG CR tablet Take by mouth.  Marland Kitchen albuterol (PROAIR HFA) 108 (90 Base) MCG/ACT inhaler 1-2 puffs by inhalation 4 times a day when needed  . atenolol (TENORMIN) 50 MG tablet Take 1 tablet (50 mg total) by mouth daily.  Marland Kitchen buPROPion (WELLBUTRIN) 100 MG tablet Take 1 tablet (100 mg total) by mouth 2 (two) times daily.  . calcium carbonate (CALCIUM 600) 600 MG TABS tablet Take by mouth.  . Cholecalciferol (D3-50) 50000 units capsule Take by mouth.  . Cholecalciferol (VITAMIN D-1000 MAX ST) 1000 units tablet Take by mouth.  . escitalopram (LEXAPRO) 20 MG tablet Take 1 tablet (20 mg total) by mouth daily.  . rizatriptan (MAXALT) 10 MG tablet Take 1 tablet (10 mg total) by mouth as needed for migraine. May repeat after 2 hours if not better. No more than 2 pills in 24 hours  . [DISCONTINUED] albuterol (PROAIR HFA) 108 (90 Base) MCG/ACT inhaler 1-2 puffs by inhalation 4 times a day when needed  . [DISCONTINUED] buPROPion (WELLBUTRIN) 100 MG tablet Take by mouth.  .  [DISCONTINUED] escitalopram (LEXAPRO) 20 MG tablet Take one daily  . [DISCONTINUED] propranolol ER (INDERAL LA) 80 MG 24 hr capsule Take by mouth.  . [DISCONTINUED] rizatriptan (MAXALT) 10 MG tablet Take by mouth.   No facility-administered encounter medications on file as of 05/01/2017.    Allergies  Allergen Reactions  . Metronidazole Hives, Itching and Rash  . Oxycodone-Acetaminophen Nausea And Vomiting    Other reaction(s): Nausea And Vomiting nausea nausea  . Prednisone Anxiety    Other reaction(s): Other (See Comments) "agitated"  . Aspirin     Unable to take aspirin products since [facility] Gastric Bypass  . Codeine     Other reaction(s): Other (See Comments)  . Other Itching    Itching from earrings -unsure of composition  . Oxycodone Other (See Comments)    Other reaction(s): Vomiting  . Fluticasone-Salmeterol Anxiety    Other Reaction: jittery Other Reaction: jittery  . Latex Rash    Other Reaction: Rash on hands only Other Reaction: Rash on hands only  . Morphine Anxiety and Itching  Does well with dilaudid and toradol Does well with dilaudid and toradol   Family History  Problem Relation Age of Onset  . Adopted: Yes    Social History   Social History  . Marital status: Single    Spouse name: N/A  . Number of children: N/A  . Years of education: N/A   Social History Main Topics  . Smoking status: Former Smoker    Quit date: 10/23/1998  . Smokeless tobacco: Never Used  . Alcohol use No  . Drug use: No  . Sexual activity: Yes    Birth control/ protection: Surgical   Other Topics Concern  . None   Social History Narrative  . None    Review of Systems  Constitutional: Negative.   Respiratory: Negative.   Cardiovascular: Negative.   Gastrointestinal: Positive for abdominal pain. Negative for abdominal distention, anal bleeding, blood in stool, constipation, diarrhea, nausea, rectal pain and vomiting.  Neurological: Negative.     Psychiatric/Behavioral: Positive for dysphoric mood. Negative for agitation, behavioral problems, confusion, decreased concentration, hallucinations, self-injury, sleep disturbance and suicidal ideas. The patient is nervous/anxious. The patient is not hyperactive.     Per HPI unless specifically indicated above     Objective:    BP 117/73 (BP Location: Left Arm, Patient Position: Sitting, Cuff Size: Large)   Pulse 73   Temp 98.8 F (37.1 C)   Ht 5\' 3"  (1.6 m)   Wt 205 lb (93 kg)   LMP 04/12/2017 (Exact Date)   SpO2 99%   BMI 36.31 kg/m   Wt Readings from Last 3 Encounters:  05/01/17 205 lb (93 kg)    Physical Exam  Constitutional: She is oriented to person, place, and time. She appears well-developed and well-nourished. No distress.  HENT:  Head: Normocephalic and atraumatic.  Right Ear: Hearing normal.  Left Ear: Hearing normal.  Nose: Nose normal.  Eyes: Conjunctivae and lids are normal. Right eye exhibits no discharge. Left eye exhibits no discharge. No scleral icterus.  Cardiovascular: Normal rate, regular rhythm, normal heart sounds and intact distal pulses.  Exam reveals no gallop and no friction rub.   No murmur heard. Pulmonary/Chest: Effort normal and breath sounds normal. No respiratory distress. She has no wheezes. She has no rales. She exhibits no tenderness.  Abdominal: Soft. Bowel sounds are normal. She exhibits no distension and no mass. There is no tenderness. There is no rebound and no guarding.  Small L inguinal hernia, reducible  Musculoskeletal: Normal range of motion.  Neurological: She is alert and oriented to person, place, and time.  Skin: Skin is warm, dry and intact. No rash noted. She is not diaphoretic. No erythema. No pallor.  Psychiatric: She has a normal mood and affect. Her speech is normal and behavior is normal. Judgment and thought content normal. Cognition and memory are normal.  Nursing note and vitals reviewed.   Results for orders  placed or performed in visit on 05/01/17  HM HIV SCREENING LAB  Result Value Ref Range   HM HIV Screening Negative - Patient reported   HM PAP SMEAR  Result Value Ref Range   HM Pap smear Normal       Assessment & Plan:   Problem List Items Addressed This Visit      Cardiovascular and Mediastinum   Paroxysmal supraventricular tachycardia (HCC)    Under good control. Propranolol too expensive, will change to atenolol. Continue to monitor.       Relevant Medications   atenolol (TENORMIN)  50 MG tablet   Migraines    Propranolol too expensive. Will change to atenolol and recheck 1 month. Refills given.       Relevant Medications   rizatriptan (MAXALT) 10 MG tablet   escitalopram (LEXAPRO) 20 MG tablet   buPROPion (WELLBUTRIN) 100 MG tablet   atenolol (TENORMIN) 50 MG tablet     Respiratory   Asthma    Refill of albuterol given today. Call with any concerns.       Relevant Medications   albuterol (PROAIR HFA) 108 (90 Base) MCG/ACT inhaler     Other   Iron deficiency anemia    Continue to follow with hematology. Call with any concerns.       Anxiety    Exacerbated because she's been off her meds. Restart meds and recheck 1 month.       Relevant Medications   escitalopram (LEXAPRO) 20 MG tablet   buPROPion (WELLBUTRIN) 100 MG tablet   Depression    Exacerbated because she's been off her meds. Restart meds and recheck 1 month.       Relevant Medications   escitalopram (LEXAPRO) 20 MG tablet   buPROPion (WELLBUTRIN) 100 MG tablet    Other Visit Diagnoses    Left inguinal hernia    -  Primary   Will get her into general surgery for evaluation. [facility] for charity care. Call with any concerns.    Relevant Orders   Ambulatory referral to General Surgery       Follow up plan: Return in about 4 weeks (around 05/29/2017) for follow up mood and HR.

## 2017-05-01 NOTE — Assessment & Plan Note (Signed)
Exacerbated because she's been off her meds. Restart meds and recheck 1 month.

## 2017-05-01 NOTE — Assessment & Plan Note (Signed)
Continue to follow with hematology. Call with any concerns.  

## 2017-05-07 ENCOUNTER — Telehealth: Payer: Self-pay

## 2017-05-07 MED ORDER — SULFAMETHOXAZOLE-TRIMETHOPRIM 800-160 MG PO TABS: 1.0000 | ORAL_TABLET | Freq: Two times a day (BID) | ORAL | 0 refills | Status: DC

## 2017-05-07 NOTE — Telephone Encounter (Signed)
Patient states that she has an abscess under her left arm, she would like to know if something can be called in?

## 2017-05-07 NOTE — Telephone Encounter (Signed)
Rx sent to her pharmacy. Let us know if not getting better 

## 2017-05-08 ENCOUNTER — Ambulatory Visit (INDEPENDENT_AMBULATORY_CARE_PROVIDER_SITE_OTHER): Payer: Self-pay | Admitting: Family Medicine

## 2017-05-08 ENCOUNTER — Encounter: Payer: Self-pay | Admitting: Family Medicine

## 2017-05-08 VITALS — BP 126/55 | HR 102 | Temp 98.3°F | Wt 204.2 lb

## 2017-05-08 DIAGNOSIS — L0291 Cutaneous abscess, unspecified: Secondary | ICD-10-CM

## 2017-05-08 NOTE — Progress Notes (Signed)
BP (!) 126/55   Pulse (!) 102   Temp 98.3 F (36.8 C) (Oral)   Wt 204 lb 3.2 oz (92.6 kg)   LMP 04/12/2017 (Exact Date)   SpO2 97%   BMI 36.17 kg/m    Subjective:    Patient ID: Nancy Sanchez, female    DOB: September 23, 1971, 46 y.o.   MRN: [id number]  HPI: Nancy Sanchez is a 46 y.o. female  Chief Complaint  Patient presents with  . Abscess   ABSCESS Duration: a few days Location: L axilla Pain:  yes Quality:  Sharp and aching Severity: moderate Redness:  yes Swelling:  yes Warmth:  yes Oozing:  yes Pus:  yes Treatments attempted:antibiotics, warm compresses Past similar infections:  no Past MRSA skin infections:  no History of trauma in area:  no Fevers:  no Nausea/vomiting:  no  Relevant past medical, surgical, family and social history reviewed and updated as indicated. Interim medical history since our last visit reviewed. Allergies and medications reviewed and updated.  Review of Systems  Constitutional: Negative.   Respiratory: Negative.   Cardiovascular: Negative.   Skin: Positive for color change and wound. Negative for pallor and rash.  Psychiatric/Behavioral: Negative.     Per HPI unless specifically indicated above     Objective:    BP (!) 126/55   Pulse (!) 102   Temp 98.3 F (36.8 C) (Oral)   Wt 204 lb 3.2 oz (92.6 kg)   LMP 04/12/2017 (Exact Date)   SpO2 97%   BMI 36.17 kg/m   Wt Readings from Last 3 Encounters:  05/08/17 204 lb 3.2 oz (92.6 kg)  05/01/17 205 lb (93 kg)    Physical Exam  Constitutional: She is oriented to person, place, and time. She appears well-developed and well-nourished. No distress.  HENT:  Head: Normocephalic and atraumatic.  Right Ear: Hearing normal.  Left Ear: Hearing normal.  Nose: Nose normal.  Eyes: Conjunctivae and lids are normal. Right eye exhibits no discharge. Left eye exhibits no discharge. No scleral icterus.  Pulmonary/Chest: Effort normal. No respiratory distress.    Musculoskeletal: Normal range of motion.  Neurological: She is alert and oriented to person, place, and time.  Skin: Skin is warm, dry and intact. No rash noted. There is erythema. No pallor.  1.5 inch round erythematous, hot, fluctuant abscess in L axilla  Psychiatric: She has a normal mood and affect. Her speech is normal and behavior is normal. Judgment and thought content normal. Cognition and memory are normal.  Nursing note and vitals reviewed.   Results for orders placed or performed in visit on 05/01/17  HM HIV SCREENING LAB  Result Value Ref Range   HM HIV Screening Negative - Patient reported   HM PAP SMEAR  Result Value Ref Range   HM Pap smear Normal       Assessment & Plan:   Problem List Items Addressed This Visit    None    Visit Diagnoses    Abscess    -  Primary   Drained today as below. Continue Abx. Follwo up for wick change on Thursday.      Skin Procedure  Procedure: I&D'd today as below, given erythema, will treat with bactrim  Diagnosis:   ICD-10-CM   1. Abscess L02.91    Drained today as below. Continue Abx. Follwo up for wick change on Thursday.    Lesion Location/Size: L axilla 1.5 inches Physician: MJ Consent:  Risks, benefits, and alternative treatments discussed  and all questions were answered.  Patient elected to proceed and verbal consent obtained.  Description: Area prepped and draped using semi-sterile technique. Area locally anesthetized using 6 cc's of lidocaine 1% without epi. Using a 15 blade scalpel, a 3cm incision was made above the lesion. Cavity entered and copious amount of serosanguinous and purulent material expressed.  Loculations broken up using mosquito hemostat. Area packed with iodaform and wound covered with dressing.   Post Procedure Instructions: Wound care instructions discussed and patient was instructed to keep area clean and dry.  Signs and symptoms of infection discussed, patient agrees to contact the office ASAP  should they occur.  Dressing change recommended every other day.     Follow up plan: [place] Thursday, [place]

## 2017-05-10 ENCOUNTER — Ambulatory Visit (INDEPENDENT_AMBULATORY_CARE_PROVIDER_SITE_OTHER): Payer: Self-pay | Admitting: Family Medicine

## 2017-05-10 ENCOUNTER — Ambulatory Visit: Payer: Self-pay | Admitting: Family Medicine

## 2017-05-10 ENCOUNTER — Encounter: Payer: Self-pay | Admitting: Family Medicine

## 2017-05-10 VITALS — BP 114/68 | HR 68 | Temp 98.8°F | Wt 204.0 lb

## 2017-05-10 DIAGNOSIS — L0291 Cutaneous abscess, unspecified: Secondary | ICD-10-CM

## 2017-05-10 NOTE — Progress Notes (Signed)
Patient here today for wick change. Tolerating antibiotics well. Feeling well. Some pain. [place] changed today. Will change again on Tomorrow given degree of drainage.

## 2017-05-11 ENCOUNTER — Ambulatory Visit (INDEPENDENT_AMBULATORY_CARE_PROVIDER_SITE_OTHER): Payer: Self-pay | Admitting: Family Medicine

## 2017-05-11 ENCOUNTER — Ambulatory Visit: Payer: Self-pay | Admitting: Family Medicine

## 2017-05-11 ENCOUNTER — Encounter: Payer: Self-pay | Admitting: Family Medicine

## 2017-05-11 VITALS — BP 116/75 | HR 72 | Temp 98.6°F | Wt 204.0 lb

## 2017-05-11 DIAGNOSIS — L0291 Cutaneous abscess, unspecified: Secondary | ICD-10-CM

## 2017-05-11 NOTE — Progress Notes (Signed)
Healing well. Smaller amount of pus. Still very tender. Wick changed. Will change again on Monday.

## 2017-05-14 ENCOUNTER — Encounter: Payer: Self-pay | Admitting: Family Medicine

## 2017-05-14 ENCOUNTER — Ambulatory Visit: Payer: Self-pay | Admitting: Family Medicine

## 2017-05-14 ENCOUNTER — Ambulatory Visit (INDEPENDENT_AMBULATORY_CARE_PROVIDER_SITE_OTHER): Payer: Self-pay | Admitting: Family Medicine

## 2017-05-14 VITALS — BP 101/67 | HR 57 | Temp 98.6°F | Wt 204.0 lb

## 2017-05-14 DIAGNOSIS — L0291 Cutaneous abscess, unspecified: Secondary | ICD-10-CM

## 2017-05-14 NOTE — Progress Notes (Signed)
Healing well. Wick changed- recheck on Wednesday.

## 2017-05-15 ENCOUNTER — Ambulatory Visit: Payer: Self-pay | Admitting: Family Medicine

## 2017-05-16 ENCOUNTER — Ambulatory Visit: Payer: Self-pay | Admitting: Family Medicine

## 2017-05-16 ENCOUNTER — Encounter: Payer: Self-pay | Admitting: Family Medicine

## 2017-05-16 VITALS — BP 107/66 | HR 62 | Temp 98.5°F | Wt 203.0 lb

## 2017-05-16 DIAGNOSIS — L0291 Cutaneous abscess, unspecified: Secondary | ICD-10-CM

## 2017-05-16 NOTE — Progress Notes (Signed)
Nancy Sanchez changed today. Healing well. Return Friday for wick change

## 2017-05-18 ENCOUNTER — Ambulatory Visit (INDEPENDENT_AMBULATORY_CARE_PROVIDER_SITE_OTHER): Payer: Self-pay | Admitting: Family Medicine

## 2017-05-18 ENCOUNTER — Ambulatory Visit: Payer: Self-pay | Admitting: Family Medicine

## 2017-05-18 ENCOUNTER — Encounter: Payer: Self-pay | Admitting: Family Medicine

## 2017-05-18 VITALS — BP 103/67 | HR 62 | Temp 98.0°F | Wt 204.0 lb

## 2017-05-18 DIAGNOSIS — L0291 Cutaneous abscess, unspecified: Secondary | ICD-10-CM

## 2017-05-18 NOTE — Progress Notes (Signed)
Healing well. Wick removed. No need for replacement.

## 2017-05-25 ENCOUNTER — Encounter: Payer: Self-pay | Admitting: Family Medicine

## 2017-05-25 MED ORDER — SULFAMETHOXAZOLE-TRIMETHOPRIM 800-160 MG PO TABS: 1.0000 | ORAL_TABLET | Freq: Two times a day (BID) | ORAL | 0 refills | Status: DC

## 2017-05-28 ENCOUNTER — Encounter: Payer: Self-pay | Admitting: Family Medicine

## 2017-05-28 ENCOUNTER — Ambulatory Visit (INDEPENDENT_AMBULATORY_CARE_PROVIDER_SITE_OTHER): Payer: Self-pay | Admitting: Family Medicine

## 2017-05-28 VITALS — BP 120/70 | HR 68 | Temp 98.8°F | Wt 204.0 lb

## 2017-05-28 DIAGNOSIS — L0291 Cutaneous abscess, unspecified: Secondary | ICD-10-CM

## 2017-05-28 DIAGNOSIS — L732 Hidradenitis suppurativa: Secondary | ICD-10-CM

## 2017-05-28 NOTE — Progress Notes (Signed)
BP 120/70 (BP Location: Left Arm, Patient Position: Sitting, Cuff Size: Normal)   Pulse 68   Temp 98.8 F (37.1 C)   Wt 204 lb (92.5 kg)   SpO2 99%   BMI 36.14 kg/m    Subjective:    Patient ID: Nancy Sanchez, female    DOB: August 21, 1971, 46 y.o.   MRN: [id number]  HPI: Nancy KinnierKimberly Renee Pol is a 46 y.o. female  Chief Complaint  Patient presents with  . Abscess   ABSCESS Duration: about a week- the other one got better, then another popped up. It popped, then another one popped up Location: L axilla Pain:  yes Quality:  Aching and pulling Severity: moderate Redness:  yes Swelling:  yes Warmth:  yes Oozing:  yes Pus:  yes Treatments attempted:antibiotics Past similar infections:  yes Past MRSA skin infections:  no History of trauma in area:  yes Fevers:  no Nausea/vomiting:  no  Relevant past medical, surgical, family and social history reviewed and updated as indicated. Interim medical history since our last visit reviewed. Allergies and medications reviewed and updated.  Review of Systems  Constitutional: Negative.   Respiratory: Negative.   Cardiovascular: Negative.   Skin: Positive for color change and wound. Negative for pallor and rash.  Psychiatric/Behavioral: Negative.     Per HPI unless specifically indicated above     Objective:    BP 120/70 (BP Location: Left Arm, Patient Position: Sitting, Cuff Size: Normal)   Pulse 68   Temp 98.8 F (37.1 C)   Wt 204 lb (92.5 kg)   SpO2 99%   BMI 36.14 kg/m   Wt Readings from Last 3 Encounters:  05/28/17 204 lb (92.5 kg)  05/18/17 204 lb (92.5 kg)  05/16/17 203 lb (92.1 kg)    Physical Exam  Constitutional: She is oriented to person, place, and time. She appears well-developed and well-nourished. No distress.  HENT:  Head: Normocephalic and atraumatic.  Right Ear: Hearing normal.  Left Ear: Hearing normal.  Nose: Nose normal.  Eyes: Conjunctivae and lids are normal. Right eye exhibits no  discharge. Left eye exhibits no discharge. No scleral icterus.  Pulmonary/Chest: Effort normal. No respiratory distress.  Musculoskeletal: Normal range of motion.  Neurological: She is alert and oriented to person, place, and time.  Skin: Skin is warm, dry and intact. No rash noted. She is not diaphoretic. There is erythema. No pallor.  Complex, fluctuant erythematous lesions with 2 heads in L axilla  Psychiatric: She has a normal mood and affect. Her speech is normal and behavior is normal. Judgment and thought content normal. Cognition and memory are normal.  Nursing note and vitals reviewed.   Results for orders placed or performed in visit on 05/01/17  HM HIV SCREENING LAB  Result Value Ref Range   HM HIV Screening Negative - Patient reported   HM PAP SMEAR  Result Value Ref Range   HM Pap smear Normal       Assessment & Plan:   Problem List Items Addressed This Visit      Musculoskeletal and Integument   Hidradenitis axillaris    Referral to general surgery made today. Call with any concerns.       Relevant Orders   Ambulatory referral to General Surgery    Other Visit Diagnoses    Abscess    -  Primary   I&D'd today as below. Continue antibiotics. Call with any concerns.       Skin Procedure  Procedure: I&D  Diagnosis:   ICD-10-CM   1. Abscess L02.91    I&D'd today as below. Continue antibiotics. Call with any concerns.   2. Hidradenitis axillaris L73.2 Ambulatory referral to General Surgery   Lesion Location/Size: 5cm complex pustule with fluctuance in the center, L axilla Physician: MJ Consent:  Risks, benefits, and alternative treatments discussed and all questions were answered.  Patient elected to proceed and verbal consent obtained.  Description: Area prepped and draped using semi-sterile technique. Area locally anesthetized using 8 cc's of lidocaine 2% with epi.  Using a 15 blade scalpel, a 2cm incision was made above the lesion. Abscess cavity entered  and moderate amount of purulent material expressed. Wound dressed after application of bacitracin ointment. Post Procedure Instructions: Wound care instructions discussed and patient was instructed to keep area clean and dry.  Signs and symptoms of infection discussed, patient agrees to contact the office ASAP should they occur.  Dressing change recommended every day   Follow up plan: Return if symptoms worsen or fail to improve.

## 2017-05-28 NOTE — Assessment & Plan Note (Signed)
Referral to general surgery made today. Call with any concerns.

## 2017-06-05 ENCOUNTER — Encounter: Payer: Self-pay | Admitting: Family Medicine

## 2017-06-05 DIAGNOSIS — M25561 Pain in right knee: Principal | ICD-10-CM

## 2017-06-05 DIAGNOSIS — G8929 Other chronic pain: Secondary | ICD-10-CM

## 2017-06-06 ENCOUNTER — Ambulatory Visit
Admission: RE | Admit: 2017-06-06 | Discharge: 2017-06-06 | Disposition: A | Discharge status: Home / Self Care | Payer: Self-pay | Source: Ambulatory Visit | Attending: Family Medicine | Admitting: Family Medicine

## 2017-06-06 DIAGNOSIS — G8929 Other chronic pain: Secondary | ICD-10-CM | POA: Insufficient documentation

## 2017-06-06 DIAGNOSIS — M25561 Pain in right knee: Principal | ICD-10-CM

## 2017-06-14 ENCOUNTER — Encounter: Payer: Self-pay | Admitting: Family Medicine

## 2017-06-14 ENCOUNTER — Ambulatory Visit (INDEPENDENT_AMBULATORY_CARE_PROVIDER_SITE_OTHER): Payer: Self-pay | Admitting: Family Medicine

## 2017-06-14 VITALS — BP 97/63 | HR 62 | Temp 98.7°F | Wt 205.0 lb

## 2017-06-14 DIAGNOSIS — G8929 Other chronic pain: Secondary | ICD-10-CM

## 2017-06-14 DIAGNOSIS — I10 Essential (primary) hypertension: Secondary | ICD-10-CM

## 2017-06-14 DIAGNOSIS — M25561 Pain in right knee: Secondary | ICD-10-CM

## 2017-06-14 MED ORDER — PROPRANOLOL HCL ER 80 MG PO CP24: 80.0000 mg | ORAL_CAPSULE | Freq: Every day | ORAL | 1 refills | Status: DC

## 2017-06-14 NOTE — Progress Notes (Signed)
BP 97/63   Pulse 62   Temp 98.7 F (37.1 C)   Wt 205 lb (93 kg)   LMP 06/06/2017   SpO2 98%   BMI 36.31 kg/m    Subjective:    Patient ID: Nancy Sanchez, female    DOB: Jul 29, 1971, 46 y.o.   MRN: [id number]  HPI: Nancy Sanchez is a 46 y.o. female  Chief Complaint  Patient presents with  . Hypertension  . Knee Pain   HYPERTENSION- she notes that the atenolol has been giving her headaches. She notes that she has been having extra headache Hypertension status: stable  Satisfied with current treatment? no Duration of hypertension: chronic BP monitoring frequency:  a few times a month BP range: 90s/60s BP medication side effects:  yes- headaches on the atenolol Medication compliance: fair compliance Previous BP meds:propranolol, atenolol Aspirin: no Recurrent headaches: yes Visual changes: no Palpitations: no Dyspnea: no Chest pain: no Lower extremity edema: no Dizzy/lightheaded: no  KNEE PAIN Duration: several months Involved knee: right Mechanism of injury: unknown Location:diffuse Onset: gradual Severity: severe  Quality:  Aching and sharp Frequency: constant Radiation: no Aggravating factors: weight bearing, walking, running, stairs and bending  Alleviating factors: nothing and ice  Status: worse Treatments attempted: rest, ice, heat, APAP, ibuprofen and aleve  Relief with NSAIDs?:  no Weakness with weight bearing or walking: no Sensation of giving way: no Locking: no Popping: yes Bruising: no Swelling: yes Redness: no Paresthesias/decreased sensation: no Fevers: no   Relevant past medical, surgical, family and social history reviewed and updated as indicated. Interim medical history since our last visit reviewed. Allergies and medications reviewed and updated.  Review of Systems  Constitutional: Negative.   Respiratory: Negative.   Cardiovascular: Negative.   Musculoskeletal: Positive for arthralgias, gait problem and joint  swelling. Negative for back pain, myalgias, neck pain and neck stiffness.  Psychiatric/Behavioral: Negative.     Per HPI unless specifically indicated above     Objective:    BP 97/63   Pulse 62   Temp 98.7 F (37.1 C)   Wt 205 lb (93 kg)   LMP 06/06/2017   SpO2 98%   BMI 36.31 kg/m   Wt Readings from Last 3 Encounters:  06/14/17 205 lb (93 kg)  05/28/17 204 lb (92.5 kg)  05/18/17 204 lb (92.5 kg)    Physical Exam  Constitutional: She is oriented to person, place, and time. She appears well-developed and well-nourished. No distress.  HENT:  Head: Normocephalic and atraumatic.  Right Ear: Hearing normal.  Left Ear: Hearing normal.  Nose: Nose normal.  Eyes: Conjunctivae and lids are normal. Right eye exhibits no discharge. Left eye exhibits no discharge. No scleral icterus.  Cardiovascular: Normal rate, regular rhythm, normal heart sounds and intact distal pulses.  Exam reveals no gallop and no friction rub.   No murmur heard. Pulmonary/Chest: Effort normal and breath sounds normal. No respiratory distress. She has no wheezes. She has no rales. She exhibits no tenderness.  Musculoskeletal: Normal range of motion.  Negative appley's compression and distraction, negative lachman's, negative mcmurrays, mild tenderness along joint line of R knee, slight effusion.  Neurological: She is alert and oriented to person, place, and time.  Skin: Skin is warm, dry and intact. No rash noted. No erythema. No pallor.  Psychiatric: She has a normal mood and affect. Her speech is normal and behavior is normal. Judgment and thought content normal. Cognition and memory are normal.  Nursing note and vitals reviewed.  Results for orders placed or performed in visit on 05/01/17  HM HIV SCREENING LAB  Result Value Ref Range   HM HIV Screening Negative - Patient reported   HM PAP SMEAR  Result Value Ref Range   HM Pap smear Normal       Assessment & Plan:   Problem List Items Addressed  This Visit      Cardiovascular and Mediastinum   Hypertension    Under good control. Headaches on atenolol. Will change back to propranolol. Call with any concerns.       Relevant Medications   propranolol ER (INDERAL LA) 80 MG 24 hr capsule    Other Visit Diagnoses    Chronic pain of right knee    -  Primary   Known arthritis. No better with conservative treatment. Steroid injection given today.      Procedure: Right  Knee Intraarticular Steroid Injection        Diagnosis:   ICD-10-CM   1. Chronic pain of right knee M25.561    G89.29    Known arthritis. No better with conservative treatment. Steroid injection given today.  2. Essential hypertension I10     Physician: MJ Consent:  Risks, benefits, and alternative treatments discussed and all questions were answered.  Patient elected to proceed and verbal consent obtained.  Description: Area prepped and draped using  semi-sterile technique.  A mixture of 40mg  of Kenalog and 4cc of lidocaine 1% was injected into the knee joint using an anterolateral approach. Complications: none Post Procedure Instructions: Wound care instructions discussed and patient was instructed to keep area clean and dry.  Signs and symptoms of infection discussed, patient agrees to contact the office ASAP should they occur.  Follow Up: Return for follow up BP/headaches.   Follow up plan: Return for follow up BP/headaches.

## 2017-06-14 NOTE — Assessment & Plan Note (Signed)
Under good control. Headaches on atenolol. Will change back to propranolol. Call with any concerns.

## 2017-06-27 DIAGNOSIS — F330 Major depressive disorder, recurrent, mild: Secondary | ICD-10-CM | POA: Insufficient documentation

## 2017-07-02 ENCOUNTER — Telehealth: Payer: Self-pay

## 2017-07-02 NOTE — Telephone Encounter (Signed)
Received FMLA paperwork for patient, unsure of her condition.  Called and left a message for patient to return my call.

## 2017-07-03 ENCOUNTER — Inpatient Hospital Stay: Payer: Self-pay | Admitting: Family Medicine

## 2017-07-04 NOTE — Telephone Encounter (Signed)
[facility] from [facility] called in regards to if office has received patients short term disability papers. [facility] disability for patient.   [employer] Contact: Phone: [phone] Ext: [phone] Attn: Asher MuirJamie

## 2017-07-04 NOTE — Telephone Encounter (Signed)
Left message for Asher MuirJamie that we did have paperwork

## 2017-07-04 NOTE — Telephone Encounter (Signed)
Called and left a message for patient to return my call.  

## 2017-07-09 NOTE — Telephone Encounter (Signed)
Patient has an appointment scheduled for 07/10/17.

## 2017-07-10 ENCOUNTER — Inpatient Hospital Stay: Payer: Self-pay | Admitting: Family Medicine

## 2017-07-11 ENCOUNTER — Encounter: Payer: Self-pay | Admitting: Family Medicine

## 2017-07-11 ENCOUNTER — Ambulatory Visit: Payer: Self-pay | Admitting: Family Medicine

## 2017-07-11 VITALS — BP 144/90 | HR 89 | Temp 99.0°F | Wt 187.0 lb

## 2017-07-11 DIAGNOSIS — L03115 Cellulitis of right lower limb: Secondary | ICD-10-CM

## 2017-07-11 DIAGNOSIS — M5431 Sciatica, right side: Secondary | ICD-10-CM

## 2017-07-11 MED ORDER — ONDANSETRON 4 MG PO TBDP: 4.0000 mg | ORAL_TABLET | Freq: Three times a day (TID) | ORAL | 0 refills | Status: DC | PRN

## 2017-07-11 NOTE — Progress Notes (Signed)
BP (!) 144/90   Pulse 89   Temp 99 F (37.2 C)   Wt 187 lb (84.8 kg)   SpO2 98%   BMI 33.13 kg/m    Subjective:    Patient ID: Nancy Sanchez, female    DOB: 1971/05/28, 46 y.o.   MRN: [id number]  HPI: Nancy Sanchez is a 46 y.o. female  Chief Complaint  Patient presents with  . Hospitalization Follow-up    was admitted for cellulitis of her right foot, needs FMLA paperwork completed  . Sciatica    still having some pain, but not as bad   Patient presents today for hospital follow up for severe cellulitis and deep abscess of right lateral foot. Has missed a significant amount of work over the past few weeks due to multiple ER visits and hospital admission for multiple complaints. Had to have surgical debridement of foot after MRI found deep infection. Sutures still intact, wound healing very well. Currently on amoxicillin and using a surgical boot. Denies fevers, chills, sweats since d/c.   Also still having some lingering issues with right low back pain and sciatica, which were her first few ER visits several weeks ago. After multiple rounds of pain medicine and muscle relaxers, is noticing improvement.   Past Medical History:  Diagnosis Date  . ADHD   . Allergy   . Anemia   . Anxiety   . Arthritis   . Asthma   . Barrett's esophagus   . Bulging lumbar disc    L5-S1  . Depression   . Endometriosis   . GERD (gastroesophageal reflux disease)   . Hernia, hiatal    DDD  . HPV in female   . Hypertension   . Migraine   . Osteoporosis    Social History   Social History  . Marital status: Single    Spouse name: N/A  . Number of children: N/A  . Years of education: N/A   Occupational History  . Not on file.   Social History Main Topics  . Smoking status: Former Smoker    Quit date: 10/23/1998  . Smokeless tobacco: Never Used  . Alcohol use No  . Drug use: No  . Sexual activity: Yes    Birth control/ protection: Surgical   Other Topics Concern  .  Not on file   Social History Narrative  . No narrative on file    Relevant past medical, surgical, family and social history reviewed and updated as indicated. Interim medical history since our last visit reviewed. Allergies and medications reviewed and updated.  Review of Systems  Constitutional: Negative.   HENT: Negative.   Respiratory: Negative.   Cardiovascular: Negative.   Gastrointestinal: Negative.   Genitourinary: Negative.   Musculoskeletal: Positive for back pain.  Neurological: Positive for numbness.  Psychiatric/Behavioral: Negative.     Per HPI unless specifically indicated above     Objective:    BP (!) 144/90   Pulse 89   Temp 99 F (37.2 C)   Wt 187 lb (84.8 kg)   SpO2 98%   BMI 33.13 kg/m   Wt Readings from Last 3 Encounters:  07/11/17 187 lb (84.8 kg)  06/14/17 205 lb (93 kg)  05/28/17 204 lb (92.5 kg)    Physical Exam  Constitutional: She is oriented to person, place, and time. She appears well-developed and well-nourished.  HENT:  Head: Atraumatic.  Eyes: Pupils are equal, round, and reactive to light. Conjunctivae are normal.  Neck: Normal range  of motion. Neck supple.  Cardiovascular: Normal rate and normal heart sounds.   Pulmonary/Chest: Effort normal and breath sounds normal.  Musculoskeletal: Normal range of motion. She exhibits no edema or deformity.  Unable to assess gait d/t recent foot surgery  Neurological: She is alert and oriented to person, place, and time.  Skin: Skin is warm and dry.  Right lateral foot incision healing very well, no erythema, minimal serous drainage, no evidence of seroma or recurring abscess  Psychiatric: She has a normal mood and affect. Her behavior is normal.  Nursing note and vitals reviewed.     Assessment & Plan:   Problem List Items Addressed This Visit    None    Visit Diagnoses    Cellulitis of right lower extremity    -  Primary   Continue amoxicillin, elevation, rest. F/u as scheduled  with orthopedics for wound check. Wound care as discussd. Return precautions reviewed   Relevant Orders   CBC with Differential/Platelet (Completed)   Basic Metabolic Panel (BMET) (Completed)   Sciatica of right side       Much improved, but still lingering in mild capacity. Continue stretches, OTC pain relievers, heat/cold prn.        Follow up plan: Return for as scheduled.

## 2017-07-12 LAB — CBC WITH DIFFERENTIAL/PLATELET
Basophils Absolute: 0.1 10*3/uL (ref 0.0–0.2)
Basos: 1 %
EOS (ABSOLUTE): 0.2 10*3/uL (ref 0.0–0.4)
Eos: 2 %
Hematocrit: 33.6 % — ABNORMAL LOW (ref 34.0–46.6)
Hemoglobin: 10.9 g/dL — ABNORMAL LOW (ref 11.1–15.9)
Immature Grans (Abs): 0 10*3/uL (ref 0.0–0.1)
Immature Granulocytes: 0 %
Lymphocytes Absolute: 2.2 10*3/uL (ref 0.7–3.1)
Lymphs: 30 %
MCH: 29.1 pg (ref 26.6–33.0)
MCHC: 32.4 g/dL (ref 31.5–35.7)
MCV: 90 fL (ref 79–97)
Monocytes Absolute: 0.6 10*3/uL (ref 0.1–0.9)
Monocytes: 8 %
Neutrophils Absolute: 4.2 10*3/uL (ref 1.4–7.0)
Neutrophils: 59 %
Platelets: 437 10*3/uL — ABNORMAL HIGH (ref 150–379)
RBC: 3.75 x10E6/uL — ABNORMAL LOW (ref 3.77–5.28)
RDW: 13 % (ref 12.3–15.4)
WBC: 7.3 10*3/uL (ref 3.4–10.8)

## 2017-07-12 LAB — BASIC METABOLIC PANEL
BUN/Creatinine Ratio: 21 (ref 9–23)
BUN: 22 mg/dL (ref 6–24)
CO2: 21 mmol/L (ref 20–29)
Calcium: 9.5 mg/dL (ref 8.7–10.2)
Chloride: 102 mmol/L (ref 96–106)
Creatinine, Ser: 1.05 mg/dL — ABNORMAL HIGH (ref 0.57–1.00)
GFR calc Af Amer: 74 mL/min/{1.73_m2} (ref >59)
GFR calc non Af Amer: 64 mL/min/{1.73_m2} (ref >59)
Glucose: 82 mg/dL (ref 65–99)
Potassium: 4.3 mmol/L (ref 3.5–5.2)
Sodium: 138 mmol/L (ref 134–144)

## 2017-07-13 NOTE — Patient Instructions (Signed)
Follow up as scheduled.  

## 2017-07-16 ENCOUNTER — Other Ambulatory Visit: Payer: Self-pay | Admitting: Family Medicine

## 2017-07-16 ENCOUNTER — Encounter: Payer: Self-pay | Admitting: Family Medicine

## 2017-07-16 MED ORDER — PROMETHAZINE HCL 25 MG PO TABS: 25.0000 mg | ORAL_TABLET | Freq: Three times a day (TID) | ORAL | 0 refills | Status: DC | PRN

## 2017-07-18 DIAGNOSIS — L02611 Cutaneous abscess of right foot: Secondary | ICD-10-CM

## 2017-07-18 HISTORY — DX: Cutaneous abscess of right foot: L02.611

## 2017-07-19 ENCOUNTER — Encounter: Payer: Self-pay | Admitting: Family Medicine

## 2017-07-19 ENCOUNTER — Ambulatory Visit: Payer: Self-pay | Admitting: Family Medicine

## 2017-07-23 ENCOUNTER — Telehealth: Payer: Self-pay

## 2017-07-23 MED ORDER — METHYLPREDNISOLONE 4 MG PO TBPK: ORAL_TABLET | ORAL | 0 refills | Status: DC

## 2017-07-23 NOTE — Telephone Encounter (Signed)
Patient notified, she has done PT in the past and is trying the stuff she learned then. She doesn't have insurance right now and can't afford to go back to PT.

## 2017-07-23 NOTE — Telephone Encounter (Signed)
Se has still been having issues with her sciatica. She has been trying stretches and walking as much as possible but it's still bothering her. She wants to know if you could call in another round of prednisone.

## 2017-07-23 NOTE — Telephone Encounter (Signed)
I wrote a Medrol dose pack.  Sometimes that is a steroid that woks well for nerve pain.  Will she do to PT?

## 2017-08-01 ENCOUNTER — Telehealth: Payer: Self-pay | Admitting: Family Medicine

## 2017-08-01 NOTE — Telephone Encounter (Signed)
[employer] called to see if we received the fax for the attending physicians statement on patient.  She said she will call back tomorrow to check on this.  Just FYI

## 2017-08-03 DIAGNOSIS — M86171 Other acute osteomyelitis, right ankle and foot: Secondary | ICD-10-CM | POA: Insufficient documentation

## 2017-08-08 NOTE — Telephone Encounter (Signed)
[employer] will be refaxing the form for patient to be filled out and faxed back for her FMLA.  Thanks

## 2017-08-09 NOTE — Telephone Encounter (Addendum)
I received the FMLA forms this morning, we will complete the paperwork. Patient is back in the hospital and had another surgery on 08/07/17.

## 2017-08-09 NOTE — Telephone Encounter (Signed)
Fleet ContrasRachel seen this patient last, do you want the paperwork?

## 2017-08-20 ENCOUNTER — Inpatient Hospital Stay: Payer: Self-pay | Admitting: Family Medicine

## 2017-08-24 ENCOUNTER — Ambulatory Visit (INDEPENDENT_AMBULATORY_CARE_PROVIDER_SITE_OTHER): Payer: Self-pay | Admitting: Family Medicine

## 2017-08-24 ENCOUNTER — Encounter: Payer: Self-pay | Admitting: Family Medicine

## 2017-08-24 VITALS — BP 132/78 | HR 84 | Temp 99.0°F | Wt 189.0 lb

## 2017-08-24 DIAGNOSIS — M86171 Other acute osteomyelitis, right ankle and foot: Secondary | ICD-10-CM

## 2017-08-24 NOTE — Progress Notes (Signed)
BP 132/78   Pulse 84   Temp 99 F (37.2 C)   Wt 189 lb (85.7 kg)   LMP 08/10/2017 (Approximate)   SpO2 99%   BMI 33.48 kg/m    Subjective:    Patient ID: Nancy Sanchez, female    DOB: 1970-11-13, 46 y.o.   MRN: [id number]  HPI: Nancy Sanchez is a 46 y.o. female  Chief Complaint  Patient presents with  . Hospitalization Follow-up    Was admitted for right foot osteomyelitis. Has a PICC line for antibiotcs. Being followed by Ortho, Wound Care, and  Infectious disease.   Patient presents today for a check in visit regarding her RLE osteomyelitis. Waiting on a work note from wound specialist to return to work. Still in quite a bit of pain, asked to limit weight bearing. Has PICC line for IV abx through the end of this month. Overall, improving slowly. No recent fevers, sweats, body aches.    Relevant past medical, surgical, family and social history reviewed and updated as indicated. Interim medical history since our last visit reviewed. Allergies and medications reviewed and updated.  Review of Systems  Constitutional: Negative.   HENT: Negative.   Respiratory: Negative.   Cardiovascular: Negative.   Gastrointestinal: Negative.   Genitourinary: Negative.   Musculoskeletal: Positive for arthralgias and joint swelling.  Neurological: Negative.   Psychiatric/Behavioral: Negative.    Per HPI unless specifically indicated above     Objective:    BP 132/78   Pulse 84   Temp 99 F (37.2 C)   Wt 189 lb (85.7 kg)   LMP 08/10/2017 (Approximate)   SpO2 99%   BMI 33.48 kg/m   Wt Readings from Last 3 Encounters:  08/24/17 189 lb (85.7 kg)  07/11/17 187 lb (84.8 kg)  06/14/17 205 lb (93 kg)    Physical Exam  Constitutional: She is oriented to person, place, and time. She appears well-developed and well-nourished. No distress.  HENT:  Head: Atraumatic.  Eyes: Conjunctivae are normal. Pupils are equal, round, and reactive to light.  Neck: Normal range of  motion. Neck supple.  Cardiovascular: Normal rate and normal heart sounds.  Pulmonary/Chest: Effort normal and breath sounds normal. No respiratory distress.  Musculoskeletal:  Has surgical boot on right foot  Neurological: She is alert and oriented to person, place, and time.  Skin: Skin is warm and dry.  Psychiatric: She has a normal mood and affect. Her behavior is normal.  Nursing note and vitals reviewed.   Results for orders placed or performed in visit on 07/11/17  CBC with Differential/Platelet  Result Value Ref Range   WBC 7.3 3.4 - 10.8 x10E3/uL   RBC 3.75 (L) 3.77 - 5.28 x10E6/uL   Hemoglobin 10.9 (L) 11.1 - 15.9 g/dL   Hematocrit 40.9 (L) 81.1 - 46.6 %   MCV 90 79 - 97 fL   MCH 29.1 26.6 - 33.0 pg   MCHC 32.4 31.5 - 35.7 g/dL   RDW 91.4 78.2 - 95.6 %   Platelets 437 (H) 150 - 379 x10E3/uL   Neutrophils 59 Not Estab. %   Lymphs 30 Not Estab. %   Monocytes 8 Not Estab. %   Eos 2 Not Estab. %   Basos 1 Not Estab. %   Neutrophils Absolute 4.2 1.4 - 7.0 x10E3/uL   Lymphocytes Absolute 2.2 0.7 - 3.1 x10E3/uL   Monocytes Absolute 0.6 0.1 - 0.9 x10E3/uL   EOS (ABSOLUTE) 0.2 0.0 - 0.4 x10E3/uL   Basophils  Absolute 0.1 0.0 - 0.2 x10E3/uL   Immature Granulocytes 0 Not Estab. %   Immature Grans (Abs) 0.0 0.0 - 0.1 x10E3/uL  Basic Metabolic Panel (BMET)  Result Value Ref Range   Glucose 82 65 - 99 mg/dL   BUN 22 6 - 24 mg/dL   Creatinine, Ser 4.091.05 (H) 0.57 - 1.00 mg/dL   GFR calc non Af Amer 64 >59 mL/min/1.73   GFR calc Af Amer 74 >59 mL/min/1.73   BUN/Creatinine Ratio 21 9 - 23   Sodium 138 134 - 144 mmol/L   Potassium 4.3 3.5 - 5.2 mmol/L   Chloride 102 96 - 106 mmol/L   CO2 21 20 - 29 mmol/L   Calcium 9.5 8.7 - 10.2 mg/dL      Assessment & Plan:   Problem List Items Addressed This Visit      Musculoskeletal and Integument   Osteomyelitis of foot, right, acute (HCC) - Primary    Continue per Wound Specialist, rest, limit weight bearing.       Relevant  Medications   DAPTOmycin (CUBICIN) 500 MG injection   cefTRIAXone 2 g in dextrose 5 % 50 mL       Follow up plan: Return if symptoms worsen or fail to improve.

## 2017-08-27 NOTE — Assessment & Plan Note (Signed)
Continue per Wound Specialist, rest, limit weight bearing.

## 2017-08-27 NOTE — Patient Instructions (Signed)
Follow up as needed

## 2017-09-06 DIAGNOSIS — M86671 Other chronic osteomyelitis, right ankle and foot: Secondary | ICD-10-CM | POA: Insufficient documentation

## 2017-09-11 ENCOUNTER — Encounter: Payer: Self-pay | Admitting: Family Medicine

## 2017-09-17 ENCOUNTER — Encounter: Payer: Self-pay | Admitting: Family Medicine

## 2017-09-21 DIAGNOSIS — B3731 Acute candidiasis of vulva and vagina: Secondary | ICD-10-CM | POA: Insufficient documentation

## 2017-09-21 DIAGNOSIS — B373 Candidiasis of vulva and vagina: Secondary | ICD-10-CM | POA: Insufficient documentation

## 2017-10-11 ENCOUNTER — Encounter: Payer: Self-pay | Admitting: Family Medicine

## 2017-10-31 DIAGNOSIS — Z87891 Personal history of nicotine dependence: Secondary | ICD-10-CM | POA: Diagnosis not present

## 2017-10-31 DIAGNOSIS — R238 Other skin changes: Secondary | ICD-10-CM | POA: Diagnosis not present

## 2017-10-31 DIAGNOSIS — L02211 Cutaneous abscess of abdominal wall: Secondary | ICD-10-CM | POA: Diagnosis not present

## 2017-10-31 DIAGNOSIS — B9562 Methicillin resistant Staphylococcus aureus infection as the cause of diseases classified elsewhere: Secondary | ICD-10-CM | POA: Diagnosis not present

## 2017-11-01 ENCOUNTER — Ambulatory Visit (INDEPENDENT_AMBULATORY_CARE_PROVIDER_SITE_OTHER): Payer: BLUE CROSS/BLUE SHIELD | Admitting: Family Medicine

## 2017-11-01 ENCOUNTER — Encounter: Payer: Self-pay | Admitting: Family Medicine

## 2017-11-01 VITALS — BP 126/81 | HR 118 | Temp 100.6°F | Wt 202.0 lb

## 2017-11-01 DIAGNOSIS — L02211 Cutaneous abscess of abdominal wall: Secondary | ICD-10-CM

## 2017-11-01 NOTE — Progress Notes (Signed)
There were no vitals taken for this visit.   Subjective:    Patient ID: Nancy Sanchez, female    DOB: Nov 26, 1970, 47 y.o.   MRN: [id number]  HPI: Nancy Sanchez is a 47 y.o. female  Chief Complaint  Patient presents with  . Insect Bite   Patient with abscess left lower abdomen had I&D done last night at the emergency room was given Septra has not started that yet as she got home so late. Now having some low-grade fever increasing redness around the site and no fuer drainage.s having systemic symptoms of fever Relevant past medical, surgical, family and social history reviewed and updated as indicated. Interim medical history since our last visit reviewed. Allergies and medications reviewed and updated.  Review of Systems  Constitutional: Negative.   Respiratory: Negative.   Cardiovascular: Negative.     Per HPI unless specifically indicated above     Objective:    There were no vitals taken for this visit.  Wt Readings from Last 3 Encounters:  08/24/17 189 lb (85.7 kg)  07/11/17 187 lb (84.8 kg)  06/14/17 205 lb (93 kg)    Physical Exam  Constitutional: She is oriented to person, place, and time. She appears well-developed and well-nourished.  HENT:  Head: Normocephalic and atraumatic.  Eyes: Conjunctivae and EOM are normal.  Neck: Normal range of motion.  Cardiovascular: Normal rate, regular rhythm and normal heart sounds.  Pulmonary/Chest: Effort normal and breath sounds normal.  Musculoskeletal: Normal range of motion.  Neurological: She is alert and oriented to person, place, and time.  Skin: No erythema.  Abscess reopened and drained small amount of purulent material wick inserted to keep wound open Instructed patient on hot compresses wound care issues.Taking antibiotics.  Psychiatric: She has a normal mood and affect. Her behavior is normal. Judgment and thought content normal.    Results for orders placed or performed in visit on 07/11/17  CBC  with Differential/Platelet  Result Value Ref Range   WBC 7.3 3.4 - 10.8 x10E3/uL   RBC 3.75 (L) 3.77 - 5.28 x10E6/uL   Hemoglobin 10.9 (L) 11.1 - 15.9 g/dL   Hematocrit 40.9 (L) 81.1 - 46.6 %   MCV 90 79 - 97 fL   MCH 29.1 26.6 - 33.0 pg   MCHC 32.4 31.5 - 35.7 g/dL   RDW 91.4 78.2 - 95.6 %   Platelets 437 (H) 150 - 379 x10E3/uL   Neutrophils 59 Not Estab. %   Lymphs 30 Not Estab. %   Monocytes 8 Not Estab. %   Eos 2 Not Estab. %   Basos 1 Not Estab. %   Neutrophils Absolute 4.2 1.4 - 7.0 x10E3/uL   Lymphocytes Absolute 2.2 0.7 - 3.1 x10E3/uL   Monocytes Absolute 0.6 0.1 - 0.9 x10E3/uL   EOS (ABSOLUTE) 0.2 0.0 - 0.4 x10E3/uL   Basophils Absolute 0.1 0.0 - 0.2 x10E3/uL   Immature Granulocytes 0 Not Estab. %   Immature Grans (Abs) 0.0 0.0 - 0.1 x10E3/uL  Basic Metabolic Panel (BMET)  Result Value Ref Range   Glucose 82 65 - 99 mg/dL   BUN 22 6 - 24 mg/dL   Creatinine, Ser 2.13 (H) 0.57 - 1.00 mg/dL   GFR calc non Af Amer 64 >59 mL/min/1.73   GFR calc Af Amer 74 >59 mL/min/1.73   BUN/Creatinine Ratio 21 9 - 23   Sodium 138 134 - 144 mmol/L   Potassium 4.3 3.5 - 5.2 mmol/L   Chloride 102 96 -  106 mmol/L   CO2 21 20 - 29 mmol/L   Calcium 9.5 8.7 - 10.2 mg/dL      Assessment & Plan:   Problem List Items Addressed This Visit    None    Visit Diagnoses    Abdominal wall abscess    -  Primary     Patient education given on wound caretreatmentuse of antibiotics and importance of starting now. Patient will apply hot compresses  Follow up plan: Return in 1 day (on 11/02/2017).

## 2017-11-02 ENCOUNTER — Encounter: Payer: Self-pay | Admitting: Family Medicine

## 2017-11-02 ENCOUNTER — Ambulatory Visit: Payer: BLUE CROSS/BLUE SHIELD | Admitting: Family Medicine

## 2017-11-02 VITALS — BP 126/77 | HR 103 | Temp 98.7°F

## 2017-11-02 DIAGNOSIS — T8189XS Other complications of procedures, not elsewhere classified, sequela: Secondary | ICD-10-CM

## 2017-11-02 DIAGNOSIS — L03311 Cellulitis of abdominal wall: Secondary | ICD-10-CM | POA: Diagnosis not present

## 2017-11-02 MED ORDER — AMOXICILLIN-POT CLAVULANATE 875-125 MG PO TABS: 1.0000 | ORAL_TABLET | Freq: Two times a day (BID) | ORAL | 0 refills | Status: DC

## 2017-11-02 NOTE — Progress Notes (Addendum)
BP 126/77 (BP Location: Left Arm, Patient Position: Sitting, Cuff Size: Normal)   Pulse (!) 103   Temp 98.7 F (37.1 C)    Subjective:    Patient ID: Nancy Sanchez, female    DOB: 1971-08-24, 47 y.o.   MRN: [id number]  HPI: Nancy Sanchez is a 47 y.o. female  Chief Complaint  Patient presents with  . wick change   Pt here today for a wick change and wound check left lower abdomen. Went to ER 2 nights ago for cellulitis, I and D attempted with minimal success, bactrim and vicodin given. Has not needed the vicodin, using tylenol prn for pain. Followed up with Dr. Jeananne Rama yesterday, who further opened the area also with minimal expression of drainage. Incision was packed with a wick. Pt had a 100.6 fever yesterday, seems to have broken overnight.  Compliant with bactrim, pain fairly well managed with tylenol at this time, no fevers this morning yet. Pt states she just overall feels fatigued and achy. Redness has continued to spread daily.   Past Medical History:  Diagnosis Date  . ADHD   . Allergy   . Anemia   . Anxiety   . Arthritis   . Asthma   . Barrett's esophagus   . Bulging lumbar disc    L5-S1  . Depression   . Endometriosis   . GERD (gastroesophageal reflux disease)   . Hernia, hiatal    DDD  . HPV in female   . Hypertension   . Migraine   . Osteoporosis    Social History   Socioeconomic History  . Marital status: Single    Spouse name: Not on file  . Number of children: Not on file  . Years of education: Not on file  . Highest education level: Not on file  Social Needs  . Financial resource strain: Not on file  . Food insecurity - worry: Not on file  . Food insecurity - inability: Not on file  . Transportation needs - medical: Not on file  . Transportation needs - non-medical: Not on file  Occupational History  . Not on file  Tobacco Use  . Smoking status: Former Smoker    Last attempt to quit: 10/23/1998    Years since quitting: 19.0    . Smokeless tobacco: Never Used  Substance and Sexual Activity  . Alcohol use: No  . Drug use: No  . Sexual activity: Yes    Birth control/protection: Surgical  Other Topics Concern  . Not on file  Social History Narrative  . Not on file   Relevant past medical, surgical, family and social history reviewed and updated as indicated. Interim medical history since our last visit reviewed. Allergies and medications reviewed and updated.  Review of Systems  Constitutional: Positive for fatigue.  Respiratory: Negative.   Cardiovascular: Negative.   Gastrointestinal: Negative.   Genitourinary: Negative.   Musculoskeletal: Negative.   Skin: Positive for color change and wound.  Neurological: Negative.   Psychiatric/Behavioral: Negative.    Per HPI unless specifically indicated above     Objective:    BP 126/77 (BP Location: Left Arm, Patient Position: Sitting, Cuff Size: Normal)   Pulse (!) 103   Temp 98.7 F (37.1 C)   Wt Readings from Last 3 Encounters:  11/15/17 201 lb 3.2 oz (91.3 kg)  11/06/17 200 lb (90.7 kg)  11/01/17 202 lb (91.6 kg)    Physical Exam  Constitutional: She is oriented to person, place, and  time.  Pt laying back in chair, appears to be very uncomfortable  HENT:  Head: Atraumatic.  Eyes: Conjunctivae are normal. Pupils are equal, round, and reactive to light. No scleral icterus.  Neck: Normal range of motion. Neck supple.  Cardiovascular: Normal rate and normal heart sounds.  Pulmonary/Chest: Effort normal and breath sounds normal.  Musculoskeletal: Normal range of motion.  Neurological: She is alert and oriented to person, place, and time.  Skin: Skin is warm and dry. There is erythema (Significant area of erythema and skin thickening surrounding the initial wound site. .5 cm incision present at center of initial wound site).  Psychiatric: Judgment and thought content normal.  Nursing note and vitals reviewed.  Wound was irrigated with sterile  saline and surface cleaned thoroughly with betadine solution. Incision site is only about 1 mm deep, slightly longer piece of sterile iodine wick placed. Wound was dressed with neosporin and bandaging. This was well tolerated with no drainage during irrigation or with pressure.      Assessment & Plan:   Problem List Items Addressed This Visit    None    Visit Diagnoses    Postprocedural non-healing wound, sequela    -  Primary   With continued increasing redness and worsening drainage. Awaiting cx from hospital visit. Will add augmentin and get her in with wound specialist   Cellulitis of abdominal wall        Wound care reviewed at length. Continue bactrim faithfully, will add augmentin as cellulitis continues to spread. Strict return precautions given for over weekend, including continued spreading redness and fevers. Pt aware to go to ER if severely worsening. Follow up Monday for wound check. Continue tylenol for pain control. Does have vicodin from ER if pain becomes severe.  Given pt's recent extensive complications from foot cellulitis, called her Infectious Disease specialist to request a tentative appt for early next week in case not improving but they state they cannot see her until mid-Feb and advised her to report to ED for worsening sxs.    Follow up plan: Return in about 3 days (around 11/05/2017) for Wound check with MAC.

## 2017-11-02 NOTE — Patient Instructions (Addendum)
Follow up in 3 days

## 2017-11-02 NOTE — Progress Notes (Signed)
Called Pt's infectious disease dr. @ [facility]. They do not have opening until 12/05/16 @ 8 a.m. I asked if issue became urgent what to do. They recommended provider call and leave vm for dr on call and/or send pt to E.R.

## 2017-11-05 ENCOUNTER — Encounter: Payer: Self-pay | Admitting: Family Medicine

## 2017-11-05 ENCOUNTER — Ambulatory Visit (INDEPENDENT_AMBULATORY_CARE_PROVIDER_SITE_OTHER): Payer: BLUE CROSS/BLUE SHIELD | Admitting: Family Medicine

## 2017-11-05 DIAGNOSIS — L02211 Cutaneous abscess of abdominal wall: Secondary | ICD-10-CM

## 2017-11-06 ENCOUNTER — Ambulatory Visit (INDEPENDENT_AMBULATORY_CARE_PROVIDER_SITE_OTHER): Payer: BLUE CROSS/BLUE SHIELD | Admitting: Family Medicine

## 2017-11-06 ENCOUNTER — Telehealth: Payer: Self-pay

## 2017-11-06 ENCOUNTER — Encounter: Payer: Self-pay | Admitting: Family Medicine

## 2017-11-06 ENCOUNTER — Ambulatory Visit: Payer: BLUE CROSS/BLUE SHIELD | Admitting: Family Medicine

## 2017-11-06 VITALS — BP 119/74 | HR 82 | Temp 98.2°F | Wt 200.0 lb

## 2017-11-06 DIAGNOSIS — L02211 Cutaneous abscess of abdominal wall: Secondary | ICD-10-CM | POA: Insufficient documentation

## 2017-11-06 DIAGNOSIS — R509 Fever, unspecified: Secondary | ICD-10-CM

## 2017-11-06 DIAGNOSIS — A4902 Methicillin resistant Staphylococcus aureus infection, unspecified site: Secondary | ICD-10-CM | POA: Diagnosis not present

## 2017-11-06 HISTORY — DX: Cutaneous abscess of abdominal wall: L02.211

## 2017-11-06 LAB — CBC WITH DIFFERENTIAL/PLATELET
Hematocrit: 37.1 % (ref 34.0–46.6)
Hemoglobin: 12.5 g/dL (ref 11.1–15.9)
Lymphocytes Absolute: 1.9 10*3/uL (ref 0.7–3.1)
Lymphs: 32 %
MCH: 30.5 pg (ref 26.6–33.0)
MCHC: 33.7 g/dL (ref 31.5–35.7)
MCV: 91 fL (ref 79–97)
MID (Absolute): 0.7 10*3/uL (ref 0.1–1.6)
MID: 12 %
Neutrophils Absolute: 3.4 10*3/uL (ref 1.4–7.0)
Neutrophils: 57 %
Platelets: 316 10*3/uL (ref 150–379)
RBC: 4.1 x10E6/uL (ref 3.77–5.28)
RDW: 11.9 % — ABNORMAL LOW (ref 12.3–15.4)
WBC: 6 10*3/uL (ref 3.4–10.8)

## 2017-11-06 MED ORDER — MUPIROCIN CALCIUM 2 % NA OINT: 1.0000 "application " | TOPICAL_OINTMENT | Freq: Two times a day (BID) | NASAL | 1 refills | Status: DC

## 2017-11-06 MED ORDER — SULFAMETHOXAZOLE-TRIMETHOPRIM 800-160 MG PO TABS: 1.0000 | ORAL_TABLET | Freq: Two times a day (BID) | ORAL | 0 refills | Status: DC

## 2017-11-06 MED ORDER — BUPROPION HCL 100 MG PO TABS: 100.0000 mg | ORAL_TABLET | Freq: Two times a day (BID) | ORAL | 1 refills | Status: DC

## 2017-11-06 NOTE — Telephone Encounter (Signed)
Request for bupropion 100 mg   [facility]  Unsure if this should be sent to you or Fleet ContrasRachel, please forward if needed

## 2017-11-06 NOTE — Assessment & Plan Note (Signed)
Continue wound care. ?

## 2017-11-06 NOTE — Progress Notes (Signed)
BP 119/74 (BP Location: Left Arm, Patient Position: Sitting, Cuff Size: Normal)   Pulse 82   Temp 98.2 F (36.8 C) (Oral)   Wt 200 lb (90.7 kg)   SpO2 98%   BMI 35.43 kg/m    Subjective:    Patient ID: Nancy Sanchez, female    DOB: [birth year], 47 y.o.   MRN: [id number]  HPI: Nancy Sanchez is a 47 y.o. female  Chief Complaint  Patient presents with  . MRSA   Pt here today for wound check, hospital wound culture for abscess of abdomen with significant cellulitis came back as MRSA and is not improving much. Last fever was Saturday. Having brown, thick drainage from wound and continued redness and warmth in the area. Still taking bactrim and augmentin for wound.  Of note, pt recently was hospitalized multiple times for cellulitis of foot that quickly became osteomyelitis requiring several weeks of IV abx via picc line. Has a general surgeon, wound specialist, and ID specialist on board from that incident.   Relevant past medical, surgical, family and social history reviewed and updated as indicated. Interim medical history since our last visit reviewed. Allergies and medications reviewed and updated.  Review of Systems  Constitutional: Positive for fever.  Respiratory: Negative.   Cardiovascular: Negative.   Musculoskeletal: Negative.   Skin: Positive for color change and wound.  Neurological: Negative.   Hematological: Negative.   Psychiatric/Behavioral: Negative.    Per HPI unless specifically indicated above     Objective:    BP 119/74 (BP Location: Left Arm, Patient Position: Sitting, Cuff Size: Normal)   Pulse 82   Temp 98.2 F (36.8 C) (Oral)   Wt 200 lb (90.7 kg)   SpO2 98%   BMI 35.43 kg/m   Wt Readings from Last 3 Encounters:  11/06/17 200 lb (90.7 kg)  11/01/17 202 lb (91.6 kg)  08/24/17 189 lb (85.7 kg)    Physical Exam  Constitutional: She is oriented to person, place, and time. She appears well-developed and well-nourished. No distress.    HENT:  Head: Atraumatic.  Eyes: Conjunctivae are normal. Pupils are equal, round, and reactive to light.  Neck: Normal range of motion. Neck supple.  Cardiovascular: Normal rate and normal heart sounds.  Pulmonary/Chest: Effort normal and breath sounds normal.  Musculoskeletal: Normal range of motion.  Neurological: She is alert and oriented to person, place, and time.  Skin: Skin is warm and dry. There is erythema.  Significant erythema left lower abdomen surrounding abscess Abscess with thick brown drainage present  Psychiatric: She has a normal mood and affect. Her behavior is normal.  Nursing note and vitals reviewed.  Results for orders placed or performed in visit on 11/06/17  CBC With Differential/Platelet  Result Value Ref Range   WBC 6.0 3.4 - 10.8 x10E3/uL   RBC 4.10 3.77 - 5.28 x10E6/uL   Hemoglobin 12.5 11.1 - 15.9 g/dL   Hematocrit 91.437.1 78.234.0 - 46.6 %   MCV 91 79 - 97 fL   MCH 30.5 26.6 - 33.0 pg   MCHC 33.7 31.5 - 35.7 g/dL   RDW 95.611.9 (L) 21.312.3 - 08.615.4 %   Platelets 316 150 - 379 x10E3/uL   Neutrophils 57 Not Estab. %   Lymphs 32 Not Estab. %   MID 12 Not Estab. %   Neutrophils Absolute 3.4 1.4 - 7.0 x10E3/uL   Lymphocytes Absolute 1.9 0.7 - 3.1 x10E3/uL   MID (Absolute) 0.7 0.1 - 1.6 X10E3/uL  Assessment & Plan:   Problem List Items Addressed This Visit    None    Visit Diagnoses    MRSA infection    -  Primary   Relevant Medications   sulfamethoxazole-trimethoprim (BACTRIM DS,SEPTRA DS) 800-160 MG tablet   Other Relevant Orders   CBC With Differential/Platelet (Completed)   Fever, unspecified fever cause        Pt had just performed home wound care about an hour prior, washing the wound well and covering with gauze pressure dressing and tape. Will leave dressing in place, continue this regimen along with bactroban ointment. ID specialist was called again to see about moving her February appt to sooner given her extensive hx of worsening with  infections. We were advised to just keep on the bactrim until then and monitor. 1 month of bactrim sent, will see pt back weekly for wound checks.  CBC today as initial CBC showed borderline elevated WBC.  Pt to remain out of work until cultures are clear of MRSA.    Follow up plan: Return in about 1 week (around 11/13/2017).

## 2017-11-06 NOTE — Progress Notes (Signed)
Wound check Wound doing better still some redness and induration but draining nicely and staying open so no need for further wick wound redressed will check tomorrow

## 2017-11-09 ENCOUNTER — Encounter: Payer: Self-pay | Admitting: Family Medicine

## 2017-11-09 DIAGNOSIS — B9562 Methicillin resistant Staphylococcus aureus infection as the cause of diseases classified elsewhere: Secondary | ICD-10-CM

## 2017-11-09 DIAGNOSIS — L089 Local infection of the skin and subcutaneous tissue, unspecified: Secondary | ICD-10-CM

## 2017-11-09 DIAGNOSIS — A4902 Methicillin resistant Staphylococcus aureus infection, unspecified site: Secondary | ICD-10-CM | POA: Diagnosis not present

## 2017-11-09 HISTORY — DX: Methicillin resistant Staphylococcus aureus infection, unspecified site: A49.02

## 2017-11-09 HISTORY — DX: Methicillin resistant Staphylococcus aureus infection as the cause of diseases classified elsewhere: B95.62

## 2017-11-09 HISTORY — DX: Local infection of the skin and subcutaneous tissue, unspecified: L08.9

## 2017-11-09 NOTE — Patient Instructions (Signed)
Follow up for wound check 

## 2017-11-14 ENCOUNTER — Encounter: Payer: Self-pay | Admitting: Family Medicine

## 2017-11-14 DIAGNOSIS — A4902 Methicillin resistant Staphylococcus aureus infection, unspecified site: Secondary | ICD-10-CM | POA: Diagnosis not present

## 2017-11-14 DIAGNOSIS — K4090 Unilateral inguinal hernia, without obstruction or gangrene, not specified as recurrent: Secondary | ICD-10-CM | POA: Diagnosis not present

## 2017-11-14 DIAGNOSIS — Z6835 Body mass index (BMI) 35.0-35.9, adult: Secondary | ICD-10-CM | POA: Diagnosis not present

## 2017-11-14 DIAGNOSIS — M86671 Other chronic osteomyelitis, right ankle and foot: Secondary | ICD-10-CM | POA: Diagnosis not present

## 2017-11-14 DIAGNOSIS — M76821 Posterior tibial tendinitis, right leg: Secondary | ICD-10-CM | POA: Diagnosis not present

## 2017-11-14 DIAGNOSIS — B9562 Methicillin resistant Staphylococcus aureus infection as the cause of diseases classified elsewhere: Secondary | ICD-10-CM | POA: Diagnosis not present

## 2017-11-15 ENCOUNTER — Ambulatory Visit (INDEPENDENT_AMBULATORY_CARE_PROVIDER_SITE_OTHER): Payer: BLUE CROSS/BLUE SHIELD | Admitting: Family Medicine

## 2017-11-15 ENCOUNTER — Other Ambulatory Visit: Payer: Self-pay | Admitting: Family Medicine

## 2017-11-15 ENCOUNTER — Ambulatory Visit: Payer: BLUE CROSS/BLUE SHIELD | Admitting: Family Medicine

## 2017-11-15 ENCOUNTER — Encounter: Payer: Self-pay | Admitting: Family Medicine

## 2017-11-15 DIAGNOSIS — L732 Hidradenitis suppurativa: Secondary | ICD-10-CM | POA: Diagnosis not present

## 2017-11-15 DIAGNOSIS — L02211 Cutaneous abscess of abdominal wall: Secondary | ICD-10-CM

## 2017-11-15 DIAGNOSIS — A4902 Methicillin resistant Staphylococcus aureus infection, unspecified site: Secondary | ICD-10-CM | POA: Diagnosis not present

## 2017-11-15 NOTE — Progress Notes (Signed)
Patient here for nose swab for MRSA which was done

## 2017-11-15 NOTE — Assessment & Plan Note (Signed)
Resolving abscess MRSA pending nasal culture to get back to work.

## 2017-11-15 NOTE — Assessment & Plan Note (Signed)
Stable with no further conditions

## 2017-11-15 NOTE — Progress Notes (Signed)
BP 101/65 (BP Location: Left Arm, Patient Position: Sitting, Cuff Size: Normal)   Pulse 66   Temp 98.1 F (36.7 C) (Oral)   Wt 201 lb 3.2 oz (91.3 kg)   SpO2 99%   BMI 35.64 kg/m    Subjective:    Patient ID: Nancy Sanchez, female    DOB: 05-Jun-1971, 47 y.o.   MRN: [id number]  HPI: Nancy Sanchez is a 47 y.o. female  Chief Complaint  Patient presents with  . MRSA  Wound well-healed closed up drainage only slight induration and slight redness.  No further MRSA symptoms with patient pending nasal culture is finished antibiotics. Still has hernia which is in surgery.  No further hidradenitis changes. Reviewed MRSA with patient  Relevant past medical, surgical, family and social history reviewed and updated as indicated. Interim medical history since our last visit reviewed. Allergies and medications reviewed and updated.  Review of Systems  Constitutional: Negative.   Respiratory: Negative.   Cardiovascular: Negative.     Per HPI unless specifically indicated above     Objective:    BP 101/65 (BP Location: Left Arm, Patient Position: Sitting, Cuff Size: Normal)   Pulse 66   Temp 98.1 F (36.7 C) (Oral)   Wt 201 lb 3.2 oz (91.3 kg)   SpO2 99%   BMI 35.64 kg/m   Wt Readings from Last 3 Encounters:  11/15/17 201 lb 3.2 oz (91.3 kg)  11/06/17 200 lb (90.7 kg)  11/01/17 202 lb (91.6 kg)    Physical Exam  Constitutional: She is oriented to person, place, and time. She appears well-developed and well-nourished.  HENT:  Head: Normocephalic and atraumatic.  Eyes: Conjunctivae and EOM are normal.  Neck: Normal range of motion.  Cardiovascular: Normal rate, regular rhythm and normal heart sounds.  Pulmonary/Chest: Effort normal and breath sounds normal.  Musculoskeletal: Normal range of motion.  Neurological: She is alert and oriented to person, place, and time.  Skin: No erythema.  Wound healing well minimal induration redness closed.  Psychiatric: She  has a normal mood and affect. Her behavior is normal. Judgment and thought content normal.    Results for orders placed or performed in visit on 11/06/17  CBC With Differential/Platelet  Result Value Ref Range   WBC 6.0 3.4 - 10.8 x10E3/uL   RBC 4.10 3.77 - 5.28 x10E6/uL   Hemoglobin 12.5 11.1 - 15.9 g/dL   Hematocrit 40.937.1 81.134.0 - 46.6 %   MCV 91 79 - 97 fL   MCH 30.5 26.6 - 33.0 pg   MCHC 33.7 31.5 - 35.7 g/dL   RDW 91.411.9 (L) 78.212.3 - 95.615.4 %   Platelets 316 150 - 379 x10E3/uL   Neutrophils 57 Not Estab. %   Lymphs 32 Not Estab. %   MID 12 Not Estab. %   Neutrophils Absolute 3.4 1.4 - 7.0 x10E3/uL   Lymphocytes Absolute 1.9 0.7 - 3.1 x10E3/uL   MID (Absolute) 0.7 0.1 - 1.6 X10E3/uL      Assessment & Plan:   Problem List Items Addressed This Visit      Musculoskeletal and Integument   Hidradenitis axillaris    Stable with no further conditions        Other   Abscess of abdominal wall    Resolving abscess MRSA pending nasal culture to get back to work.       Pending hernia surgery  Follow up plan: Return if symptoms worsen or fail to improve, for As scheduled.

## 2017-11-17 LAB — MRSA BY NAA: MRSA by NAA: NEGATIVE

## 2017-11-28 ENCOUNTER — Encounter: Payer: Self-pay | Admitting: Family Medicine

## 2017-12-06 ENCOUNTER — Other Ambulatory Visit: Payer: Self-pay | Admitting: Family Medicine

## 2017-12-18 DIAGNOSIS — I10 Essential (primary) hypertension: Secondary | ICD-10-CM | POA: Diagnosis not present

## 2017-12-18 DIAGNOSIS — L732 Hidradenitis suppurativa: Secondary | ICD-10-CM | POA: Diagnosis not present

## 2017-12-18 DIAGNOSIS — Z7951 Long term (current) use of inhaled steroids: Secondary | ICD-10-CM | POA: Diagnosis not present

## 2017-12-18 DIAGNOSIS — Z792 Long term (current) use of antibiotics: Secondary | ICD-10-CM | POA: Diagnosis not present

## 2017-12-18 DIAGNOSIS — Z9884 Bariatric surgery status: Secondary | ICD-10-CM | POA: Diagnosis not present

## 2017-12-18 DIAGNOSIS — Z87891 Personal history of nicotine dependence: Secondary | ICD-10-CM | POA: Diagnosis not present

## 2017-12-18 DIAGNOSIS — Z885 Allergy status to narcotic agent status: Secondary | ICD-10-CM | POA: Diagnosis not present

## 2017-12-18 DIAGNOSIS — J45909 Unspecified asthma, uncomplicated: Secondary | ICD-10-CM | POA: Diagnosis not present

## 2017-12-18 DIAGNOSIS — F419 Anxiety disorder, unspecified: Secondary | ICD-10-CM | POA: Diagnosis not present

## 2017-12-18 DIAGNOSIS — Z888 Allergy status to other drugs, medicaments and biological substances status: Secondary | ICD-10-CM | POA: Diagnosis not present

## 2017-12-18 DIAGNOSIS — K4090 Unilateral inguinal hernia, without obstruction or gangrene, not specified as recurrent: Secondary | ICD-10-CM | POA: Diagnosis not present

## 2017-12-18 DIAGNOSIS — K219 Gastro-esophageal reflux disease without esophagitis: Secondary | ICD-10-CM | POA: Diagnosis not present

## 2017-12-18 DIAGNOSIS — Z79899 Other long term (current) drug therapy: Secondary | ICD-10-CM | POA: Diagnosis not present

## 2017-12-18 DIAGNOSIS — Z886 Allergy status to analgesic agent status: Secondary | ICD-10-CM | POA: Diagnosis not present

## 2017-12-18 HISTORY — PX: INGUINAL HERNIA REPAIR: SUR1180

## 2017-12-24 ENCOUNTER — Encounter: Payer: Self-pay | Admitting: Family Medicine

## 2017-12-24 ENCOUNTER — Ambulatory Visit (INDEPENDENT_AMBULATORY_CARE_PROVIDER_SITE_OTHER): Payer: BLUE CROSS/BLUE SHIELD | Admitting: Family Medicine

## 2017-12-24 VITALS — BP 98/62 | HR 74 | Temp 98.3°F | Wt 208.6 lb

## 2017-12-24 DIAGNOSIS — A4902 Methicillin resistant Staphylococcus aureus infection, unspecified site: Secondary | ICD-10-CM

## 2017-12-24 NOTE — Progress Notes (Signed)
   BP 98/62 (BP Location: Left Arm, Patient Position: Sitting, Cuff Size: Normal)   Pulse 74   Temp 98.3 F (36.8 C) (Oral)   Wt 208 lb 9.6 oz (94.6 kg)   SpO2 98%   BMI 36.95 kg/m    Subjective:    Patient ID: Nancy Sanchez, female    DOB: 11/09/1970, 47 y.o.   MRN: [id number]  HPI: Nancy KinnierKimberly Renee Dara is a 47 y.o. female  Chief Complaint  Patient presents with  . Depression    Relevant past medical, surgical, family and social history reviewed and updated as indicated. Interim medical history since our last visit reviewed. Allergies and medications reviewed and updated.  Review of Systems  Per HPI unless specifically indicated above     Objective:    BP 98/62 (BP Location: Left Arm, Patient Position: Sitting, Cuff Size: Normal)   Pulse 74   Temp 98.3 F (36.8 C) (Oral)   Wt 208 lb 9.6 oz (94.6 kg)   SpO2 98%   BMI 36.95 kg/m   Wt Readings from Last 3 Encounters:  12/24/17 208 lb 9.6 oz (94.6 kg)  11/15/17 201 lb 3.2 oz (91.3 kg)  11/06/17 200 lb (90.7 kg)    Physical Exam  Results for orders placed or performed in visit on 11/15/17  MRSA by NAA  Result Value Ref Range   MRSA by NAA Negative Negative      Assessment & Plan:   Problem List Items Addressed This Visit    None       Follow up plan: No Follow-up on file.

## 2017-12-25 ENCOUNTER — Ambulatory Visit: Payer: BLUE CROSS/BLUE SHIELD | Admitting: Family Medicine

## 2017-12-25 ENCOUNTER — Encounter: Payer: Self-pay | Admitting: Family Medicine

## 2017-12-25 VITALS — BP 110/66 | HR 66

## 2017-12-25 DIAGNOSIS — I10 Essential (primary) hypertension: Secondary | ICD-10-CM | POA: Diagnosis not present

## 2017-12-25 DIAGNOSIS — H40059 Ocular hypertension, unspecified eye: Secondary | ICD-10-CM | POA: Diagnosis not present

## 2017-12-25 DIAGNOSIS — I471 Supraventricular tachycardia: Secondary | ICD-10-CM | POA: Diagnosis not present

## 2017-12-25 DIAGNOSIS — D508 Other iron deficiency anemias: Secondary | ICD-10-CM

## 2017-12-25 DIAGNOSIS — Z9889 Other specified postprocedural states: Secondary | ICD-10-CM

## 2017-12-25 DIAGNOSIS — Z8719 Personal history of other diseases of the digestive system: Secondary | ICD-10-CM

## 2017-12-25 DIAGNOSIS — F331 Major depressive disorder, recurrent, moderate: Secondary | ICD-10-CM | POA: Diagnosis not present

## 2017-12-25 DIAGNOSIS — F419 Anxiety disorder, unspecified: Secondary | ICD-10-CM

## 2017-12-25 DIAGNOSIS — L732 Hidradenitis suppurativa: Secondary | ICD-10-CM | POA: Diagnosis not present

## 2017-12-25 DIAGNOSIS — K219 Gastro-esophageal reflux disease without esophagitis: Secondary | ICD-10-CM | POA: Diagnosis not present

## 2017-12-25 MED ORDER — ALBUTEROL SULFATE HFA 108 (90 BASE) MCG/ACT IN AERS: INHALATION_SPRAY | RESPIRATORY_TRACT | 2 refills | Status: DC

## 2017-12-25 MED ORDER — MONTELUKAST SODIUM 10 MG PO TABS: 10.0000 mg | ORAL_TABLET | Freq: Every day | ORAL | 1 refills | Status: DC

## 2017-12-25 MED ORDER — ESCITALOPRAM OXALATE 20 MG PO TABS: 20.0000 mg | ORAL_TABLET | Freq: Every day | ORAL | 1 refills | Status: DC

## 2017-12-25 MED ORDER — RIZATRIPTAN BENZOATE 10 MG PO TABS: 10.0000 mg | ORAL_TABLET | ORAL | 12 refills | Status: DC | PRN

## 2017-12-25 MED ORDER — BUDESONIDE-FORMOTEROL FUMARATE 160-4.5 MCG/ACT IN AERO: 2.0000 | INHALATION_SPRAY | Freq: Two times a day (BID) | RESPIRATORY_TRACT | 3 refills | Status: DC

## 2017-12-25 MED ORDER — PROPRANOLOL HCL ER 80 MG PO CP24: 80.0000 mg | ORAL_CAPSULE | Freq: Every day | ORAL | 1 refills | Status: DC

## 2017-12-25 MED ORDER — BUPROPION HCL ER (XL) 150 MG PO TB24: 150.0000 mg | ORAL_TABLET | Freq: Every day | ORAL | 3 refills | Status: DC

## 2017-12-25 NOTE — Assessment & Plan Note (Signed)
Would like to see dermatology. Referral generated today. 

## 2017-12-25 NOTE — Patient Instructions (Signed)
[facility], [facility]  [place], [place]  [phone]  Sat: 9AM-3:30PM

## 2017-12-25 NOTE — Assessment & Plan Note (Signed)
Under good control today. Omeprazole OTC. To check blood work at her physical at the end of the month.

## 2017-12-25 NOTE — Assessment & Plan Note (Signed)
Not under good control. Has only been taking wellbutrin daily, not BID, will change to long acting and increase to 150mg , recheck 1 month, if not doing better, will consider increasing dose. Continue lexapro.

## 2017-12-25 NOTE — Assessment & Plan Note (Signed)
Not under good control. Has only been taking wellbutrin daily, not BID, will change to long acting and increase to 150mg, recheck 1 month, if not doing better, will consider increasing dose. Continue lexapro. 

## 2017-12-25 NOTE — Assessment & Plan Note (Signed)
Under good control today. Refill of propranolol given today. To check blood work at her physical at the end of the month.

## 2017-12-25 NOTE — Assessment & Plan Note (Signed)
Pulse under good control today. Refill of propranolol given today. Call with any concerns.

## 2017-12-25 NOTE — Progress Notes (Signed)
BP 110/66 (BP Location: Left Arm, Patient Position: Sitting, Cuff Size: Normal)   Pulse 66   SpO2 99%    Subjective:    Patient ID: Nancy Sanchez, female    DOB: Sep 28, 1971, 47 y.o.   MRN: [id number]  HPI: Nancy Sanchez is a 47 y.o. female  Chief Complaint  Patient presents with  . Depression    follow up   Had hernia repair done last Tuesday- following up with them next week. Mom passed from an infected mesh and is anxious about that. She had a lot of moving over the weekend and has been feeling uncomfortable.   Would like to see the dermatologist about hidratinitis. Doesn't want to have the surgery for it.   Saw an eye doctor several years ago and had borderline pressures- has not seen one since. Wants to see someone open on Saturdays due to work schedule.   DEPRESSION- lost her Dad about a month ago. Has not been talking with a grief counselor. Her boyfriend is cheating. Has been drinking more than she usually does. She is concerned with concentration- has a history of ADD. Son is also giving her a lot of trouble and making her feel guilty. Mood status: exacerbated Satisfied with current treatment?: no Symptom severity: moderate  Duration of current treatment : chronic Side effects: no Medication compliance: excellent compliance Psychotherapy/counseling: no  Previous psychiatric medications: lexapro, wellbutrin Depressed mood: yes Anxious mood: yes Anhedonia: no Significant weight loss or gain: no Insomnia: no  Fatigue: yes Feelings of worthlessness or guilt: yes Impaired concentration/indecisiveness: yes Suicidal ideations: no Hopelessness: yes Crying spells: yes [facility] 12/25/2017 05/01/2017  Decreased Interest 3 1  Down, Depressed, Hopeless 3 1  PHQ - 2 Score 6 2  Altered sleeping 3 1  Tired, decreased energy 3 1  Change in appetite 1 0  Feeling bad or failure about yourself  1 0  Trouble concentrating 2 1  Moving slowly or  fidgety/restless 0 0  Suicidal thoughts 0 0  PHQ-9 Score 16 5   GAD 7 : Generalized Anxiety Score 12/25/2017 05/01/2017  Nervous, Anxious, on Edge 1 2  Control/stop worrying 1 2  Worry too much - different things 1 2  Trouble relaxing 1 2  Restless 2 2  Easily annoyed or irritable 1 1  Afraid - awful might happen 1 2  Total GAD 7 Score 8 13  Anxiety Difficulty - Not difficult at all    Relevant past medical, surgical, family and social history reviewed and updated as indicated. Interim medical history since our last visit reviewed. Allergies and medications reviewed and updated.  Review of Systems  Constitutional: Negative.   Respiratory: Negative.   Cardiovascular: Negative.   Psychiatric/Behavioral: Positive for agitation, decreased concentration and dysphoric mood. Negative for behavioral problems, confusion, hallucinations, self-injury, sleep disturbance and suicidal ideas. The patient is nervous/anxious. The patient is not hyperactive.     Per HPI unless specifically indicated above     Objective:    BP 110/66 (BP Location: Left Arm, Patient Position: Sitting, Cuff Size: Normal)   Pulse 66   SpO2 99%   Wt Readings from Last 3 Encounters:  12/24/17 208 lb 9.6 oz (94.6 kg)  11/15/17 201 lb 3.2 oz (91.3 kg)  11/06/17 200 lb (90.7 kg)    Physical Exam  Constitutional: She is oriented to person, place, and time. She appears well-developed and well-nourished. No distress.  HENT:  Head: Normocephalic and atraumatic.  Right Ear:  Hearing normal.  Left Ear: Hearing normal.  Nose: Nose normal.  Eyes: Conjunctivae and lids are normal. Right eye exhibits no discharge. Left eye exhibits no discharge. No scleral icterus.  Cardiovascular: Normal rate, regular rhythm, normal heart sounds and intact distal pulses. Exam reveals no gallop and no friction rub.  No murmur heard. Pulmonary/Chest: Effort normal and breath sounds normal. No respiratory distress. She has no wheezes. She has  no rales. She exhibits no tenderness.  Abdominal: Soft. Bowel sounds are normal. She exhibits no distension and no mass. There is no tenderness. There is no rebound and no guarding.  Musculoskeletal: Normal range of motion.  Neurological: She is alert and oriented to person, place, and time.  Skin: Skin is warm, dry and intact. No rash noted. She is not diaphoretic. No erythema. No pallor.  Clean and dry incision. No redness, no swelling, appears to be healing well.   Psychiatric: She has a normal mood and affect. Her speech is normal and behavior is normal. Judgment and thought content normal. Cognition and memory are normal.  Nursing note and vitals reviewed.   Results for orders placed or performed in visit on 11/15/17  MRSA by NAA  Result Value Ref Range   MRSA by NAA Negative Negative      Assessment & Plan:   Problem List Items Addressed This Visit      Cardiovascular and Mediastinum   Paroxysmal supraventricular tachycardia (HCC)    Pulse under good control today. Refill of propranolol given today. Call with any concerns.       Relevant Medications   propranolol ER (INDERAL LA) 80 MG 24 hr capsule   Hypertension    Under good control today. Refill of propranolol given today. To check blood work at her physical at the end of the month.       Relevant Medications   propranolol ER (INDERAL LA) 80 MG 24 hr capsule     Digestive   GERD (gastroesophageal reflux disease)    Under good control today. Omeprazole OTC. To check blood work at her physical at the end of the month.         Musculoskeletal and Integument   Hidradenitis axillaris    Would like to see dermatology. Referral generated today.      Relevant Orders   Ambulatory referral to Dermatology     Other   Iron deficiency anemia    Under good control today. Iron OTC. To check blood work at her physical at the end of the month.       Anxiety    Not under good control. Has only been taking wellbutrin  daily, not BID, will change to long acting and increase to 150mg , recheck 1 month, if not doing better, will consider increasing dose. Continue lexapro.      Relevant Medications   buPROPion (WELLBUTRIN XL) 150 MG 24 hr tablet   escitalopram (LEXAPRO) 20 MG tablet   Depression - Primary    Not under good control. Has only been taking wellbutrin daily, not BID, will change to long acting and increase to 150mg , recheck 1 month, if not doing better, will consider increasing dose. Continue lexapro.      Relevant Medications   buPROPion (WELLBUTRIN XL) 150 MG 24 hr tablet   escitalopram (LEXAPRO) 20 MG tablet    Other Visit Diagnoses    Borderline glaucoma with ocular hypertension, unspecified laterality       Referral to opthalmology made today.   Relevant Orders  Ambulatory referral to Ophthalmology   S/P left inguinal hernia repair       Healing well. Avoid lifting. Follow up with general surgery as scheduled. Call with any concerns.        Follow up plan: Return As scheduled, for physical/follow up mood.

## 2017-12-25 NOTE — Assessment & Plan Note (Signed)
Under good control today. Iron OTC. To check blood work at her physical at the end of the month.

## 2018-01-18 ENCOUNTER — Encounter: Payer: Self-pay | Admitting: Family Medicine

## 2018-01-18 ENCOUNTER — Ambulatory Visit (INDEPENDENT_AMBULATORY_CARE_PROVIDER_SITE_OTHER): Payer: BLUE CROSS/BLUE SHIELD | Admitting: Family Medicine

## 2018-01-18 VITALS — BP 117/75 | HR 68 | Temp 99.3°F | Ht 63.0 in | Wt 207.2 lb

## 2018-01-18 DIAGNOSIS — Z124 Encounter for screening for malignant neoplasm of cervix: Secondary | ICD-10-CM | POA: Diagnosis not present

## 2018-01-18 DIAGNOSIS — Z0000 Encounter for general adult medical examination without abnormal findings: Secondary | ICD-10-CM | POA: Diagnosis not present

## 2018-01-18 DIAGNOSIS — Z0001 Encounter for general adult medical examination with abnormal findings: Secondary | ICD-10-CM

## 2018-01-18 DIAGNOSIS — Z1239 Encounter for other screening for malignant neoplasm of breast: Secondary | ICD-10-CM

## 2018-01-18 DIAGNOSIS — F331 Major depressive disorder, recurrent, moderate: Secondary | ICD-10-CM

## 2018-01-18 DIAGNOSIS — Z113 Encounter for screening for infections with a predominantly sexual mode of transmission: Secondary | ICD-10-CM | POA: Diagnosis not present

## 2018-01-18 MED ORDER — BUPROPION HCL ER (XL) 300 MG PO TB24: 300.0000 mg | ORAL_TABLET | Freq: Every day | ORAL | 3 refills | Status: DC

## 2018-01-18 NOTE — Assessment & Plan Note (Signed)
Not doing great. Will increase her wellbutrin and recheck 1 month. Call with any concerns.  

## 2018-01-18 NOTE — Progress Notes (Signed)
BP 117/75 (BP Location: Left Arm, Patient Position: Sitting, Cuff Size: Normal)   Pulse 68   Temp 99.3 F (37.4 C)   Ht 5\' 3"  (1.6 m)   Wt 207 lb 3 oz (94 kg)   SpO2 98%   BMI 36.70 kg/m    Subjective:    Patient ID: Nancy Sanchez, female    DOB: May 24, 1971, 47 y.o.   MRN: [id number]  HPI: Nancy Sanchez is a 47 y.o. female presenting on 01/18/2018 for comprehensive medical examination. Current medical complaints include:  DEPRESSION- doing better. She has had some tough days. Her boyfriend woke up with her 2 weeks ago. Having a tough time, but doing better with her son.  Mood status: better Satisfied with current treatment?: no Symptom severity: moderate  Duration of current treatment : 3 weeks Side effects: no Medication compliance: excellent compliance Psychotherapy/counseling: no  Previous psychiatric medications: lexapro, wellbutrin Depressed mood: yes Anxious mood: yes Anhedonia: no Significant weight loss or gain: no Insomnia: yes  Fatigue: yes Feelings of worthlessness or guilt: yes Impaired concentration/indecisiveness: yes Suicidal ideations: no Hopelessness: no Crying spells: no Depression screen [facility] 2/9 12/25/2017 05/01/2017  Decreased Interest 3 1  Down, Depressed, Hopeless 3 1  PHQ - 2 Score 6 2  Altered sleeping 3 1  Tired, decreased energy 3 1  Change in appetite 1 0  Feeling bad or failure about yourself  1 0  Trouble concentrating 2 1  Moving slowly or fidgety/restless 0 0  Suicidal thoughts 0 0  PHQ-9 Score 16 5   GAD 7 : Generalized Anxiety Score 12/25/2017 05/01/2017  Nervous, Anxious, on Edge 1 2  Control/stop worrying 1 2  Worry too much - different things 1 2  Trouble relaxing 1 2  Restless 2 2  Easily annoyed or irritable 1 1  Afraid - awful might happen 1 2  Total GAD 7 Score 8 13  Anxiety Difficulty - Not difficult at all   She currently lives with: alone Menopausal Symptoms: no  Past Medical History:  Past Medical  History:  Diagnosis Date  . ADHD   . Allergy   . Anemia   . Anxiety   . Arthritis   . Asthma   . Barrett's esophagus   . Bulging lumbar disc    L5-S1  . Depression   . Endometriosis   . GERD (gastroesophageal reflux disease)   . Hernia, hiatal    DDD  . HPV in female   . Hypertension   . Migraine   . Osteoporosis     Surgical History:  Past Surgical History:  Procedure Laterality Date  . ARTHROSCOPIC REPAIR ACL Left   . CARPAL TUNNEL RELEASE Bilateral   . ESSURE TUBAL LIGATION    . GASTRIC BYPASS    . HEEL SPUR EXCISION Left   . INGUINAL HERNIA REPAIR  12/18/2017  . TONSILLECTOMY      Medications:  Current Outpatient Medications on File Prior to Visit  Medication Sig  . BIOTIN PO Take by mouth.  . Lactobacillus (ACIDOPHILUS PO) Take by mouth.  . TURMERIC PO Take by mouth.  Marland Kitchen acetaminophen (TYLENOL) 650 MG CR tablet Take by mouth.  Marland Kitchen albuterol (PROAIR HFA) 108 (90 Base) MCG/ACT inhaler 1-2 puffs by inhalation 4 times a day when needed  . budesonide-formoterol (SYMBICORT) 160-4.5 MCG/ACT inhaler Inhale 2 puffs into the lungs 2 (two) times daily.  . Cholecalciferol (D3-50) 50000 units capsule Take by mouth.  . docusate sodium (COLACE) 100 MG  capsule Take 100 mg by mouth 2 (two) times daily as needed.  Marland Kitchen escitalopram (LEXAPRO) 20 MG tablet Take 1 tablet (20 mg total) by mouth daily.  . ferrous sulfate 324 (65 Fe) MG TBEC Take by mouth daily.  . fexofenadine (ALLEGRA) 180 MG tablet Take 180 mg by mouth daily.  . fluticasone (FLONASE) 50 MCG/ACT nasal spray Place into the nose.  . magnesium oxide (MAG-OX) 400 MG tablet Take 400 mg by mouth 2 (two) times daily.  . montelukast (SINGULAIR) 10 MG tablet Take 1 tablet (10 mg total) by mouth daily.  . mupirocin nasal ointment (BACTROBAN NASAL) 2 % Place 1 application into the nose 2 (two) times daily. Use one-half of tube in each nostril twice daily. After application, press sides of nose together and gently massage.  .  Omega-3 1000 MG CAPS Take by mouth.  Marland Kitchen omeprazole (PRILOSEC) 20 MG capsule Take 20 mg by mouth daily.  . propranolol ER (INDERAL LA) 80 MG 24 hr capsule Take 1 capsule (80 mg total) by mouth daily.  . rizatriptan (MAXALT) 10 MG tablet Take 1 tablet (10 mg total) by mouth as needed for migraine. May repeat after 2 hours if not better. No more than 2 pills in 24 hours  . traMADol (ULTRAM) 50 MG tablet Take 50 mg by mouth every 6 (six) hours as needed.  . vitamin C (ASCORBIC ACID) 500 MG tablet Take 500 mg by mouth daily.   No current facility-administered medications on file prior to visit.     Allergies:  Allergies  Allergen Reactions  . Metronidazole Hives, Itching and Rash  . Oxycodone-Acetaminophen Nausea And Vomiting    Other reaction(s): Nausea And Vomiting nausea nausea  . Prednisone Anxiety    Other reaction(s): Other (See Comments) "agitated"  . Tape Rash  . Aspirin     Unable to take aspirin products since RNY Gastric Bypass  . Codeine     Other reaction(s): Other (See Comments)  . Nickel Itching    Itching from earrings -unsure of composition  . Other Itching    Itching from earrings -unsure of composition  . Oxycodone Other (See Comments)    Other reaction(s): Vomiting  . Latex Rash    Other Reaction: Rash on hands only Other Reaction: Rash on hands only  . Morphine Anxiety and Itching    Does well with dilaudid and toradol Does well with dilaudid and toradol    Social History:  Social History   Socioeconomic History  . Marital status: Single    Spouse name: Not on file  . Number of children: Not on file  . Years of education: Not on file  . Highest education level: Not on file  Occupational History  . Not on file  Social Needs  . Financial resource strain: Not on file  . Food insecurity:    Worry: Not on file    Inability: Not on file  . Transportation needs:    Medical: Not on file    Non-medical: Not on file  Tobacco Use  . Smoking status:  Former Smoker    Last attempt to quit: 10/23/1998    Years since quitting: 19.2  . Smokeless tobacco: Never Used  Substance and Sexual Activity  . Alcohol use: No  . Drug use: No  . Sexual activity: Yes    Birth control/protection: Surgical  Lifestyle  . Physical activity:    Days per week: Not on file    Minutes per session: Not on file  .  Stress: Not on file  Relationships  . Social connections:    Talks on phone: Not on file    Gets together: Not on file    Attends religious service: Not on file    Active member of club or organization: Not on file    Attends meetings of clubs or organizations: Not on file    Relationship status: Not on file  . Intimate partner violence:    Fear of current or ex partner: Not on file    Emotionally abused: Not on file    Physically abused: Not on file    Forced sexual activity: Not on file  Other Topics Concern  . Not on file  Social History Narrative  . Not on file   Social History   Tobacco Use  Smoking Status Former Smoker  . Last attempt to quit: 10/23/1998  . Years since quitting: 19.2  Smokeless Tobacco Never Used   Social History   Substance and Sexual Activity  Alcohol Use No    Family History:  Family History  Adopted: Yes  Problem Relation Age of Onset  . Heart disease Father     Past medical history, surgical history, medications, allergies, family history and social history reviewed with patient today and changes made to appropriate areas of the chart.   Review of Systems  Constitutional: Negative.   HENT: Positive for hearing loss. Negative for congestion, ear discharge, ear pain, nosebleeds, sinus pain, sore throat and tinnitus.   Eyes: Positive for blurred vision. Negative for double vision, photophobia, pain, discharge and redness.  Respiratory: Negative.  Negative for stridor.   Cardiovascular: Negative.   Gastrointestinal: Positive for constipation. Negative for abdominal pain, blood in stool, diarrhea,  heartburn, melena, nausea and vomiting.  Genitourinary: Negative.   Musculoskeletal: Negative.   Skin: Negative.   Neurological: Positive for dizziness. Negative for tingling, tremors, sensory change, speech change, focal weakness, seizures, loss of consciousness, weakness and headaches.  Endo/Heme/Allergies: Positive for environmental allergies. Negative for polydipsia. Does not bruise/bleed easily.  Psychiatric/Behavioral: Positive for depression. Negative for hallucinations, memory loss, substance abuse and suicidal ideas. The patient is nervous/anxious. The patient does not have insomnia.     All other ROS negative except what is listed above and in the HPI.      Objective:    BP 117/75 (BP Location: Left Arm, Patient Position: Sitting, Cuff Size: Normal)   Pulse 68   Temp 99.3 F (37.4 C)   Ht 5\' 3"  (1.6 m)   Wt 207 lb 3 oz (94 kg)   SpO2 98%   BMI 36.70 kg/m   Wt Readings from Last 3 Encounters:  01/18/18 207 lb 3 oz (94 kg)  12/24/17 208 lb 9.6 oz (94.6 kg)  11/15/17 201 lb 3.2 oz (91.3 kg)    Physical Exam  Constitutional: She is oriented to person, place, and time. She appears well-developed and well-nourished. No distress.  HENT:  Head: Normocephalic and atraumatic.  Right Ear: Hearing, tympanic membrane, external ear and ear canal normal.  Left Ear: Hearing, tympanic membrane, external ear and ear canal normal.  Nose: Nose normal.  Mouth/Throat: Uvula is midline, oropharynx is clear and moist and mucous membranes are normal. No oropharyngeal exudate.  Eyes: Pupils are equal, round, and reactive to light. Conjunctivae, EOM and lids are normal. Right eye exhibits no discharge. Left eye exhibits no discharge. No scleral icterus.  Neck: Normal range of motion. Neck supple. No JVD present. No tracheal deviation present. No thyromegaly present.  Cardiovascular: Normal rate, regular rhythm, normal heart sounds and intact distal pulses. Exam reveals no gallop and no friction  rub.  No murmur heard. Pulmonary/Chest: Effort normal and breath sounds normal. No stridor. No respiratory distress. She has no wheezes. She has no rales. She exhibits no tenderness. Right breast exhibits no inverted nipple, no mass, no nipple discharge, no skin change and no tenderness. Left breast exhibits no inverted nipple, no nipple discharge, no skin change and no tenderness. Breasts are symmetrical.  Abdominal: Soft. Bowel sounds are normal. She exhibits no distension and no mass. There is no tenderness. There is no rebound and no guarding. Hernia confirmed negative in the right inguinal area and confirmed negative in the left inguinal area.  Genitourinary: Vagina normal and uterus normal. No labial fusion. There is no rash, tenderness, lesion or injury on the right labia. There is no rash, tenderness, lesion or injury on the left labia. Uterus is not deviated, not enlarged, not fixed and not tender. Cervix exhibits no motion tenderness and no discharge. Right adnexum displays no mass, no tenderness and no fullness. Left adnexum displays no mass, no tenderness and no fullness. No erythema, tenderness or bleeding in the vagina. No foreign body in the vagina. No signs of injury around the vagina. No vaginal discharge found.  Musculoskeletal: Normal range of motion. She exhibits no edema, tenderness or deformity.  Lymphadenopathy:    She has no cervical adenopathy.  Neurological: She is alert and oriented to person, place, and time. She has normal reflexes. She displays normal reflexes. No cranial nerve deficit. She exhibits normal muscle tone. Coordination normal.  Skin: Skin is warm, dry and intact. No rash noted. She is not diaphoretic. No erythema. No pallor.  Psychiatric: She has a normal mood and affect. Her speech is normal and behavior is normal. Judgment and thought content normal. Cognition and memory are normal.  Nursing note and vitals reviewed.   Results for orders placed or performed  in visit on 11/15/17  MRSA by NAA  Result Value Ref Range   MRSA by NAA Negative Negative      Assessment & Plan:   Problem List Items Addressed This Visit      Other   Depression    Not doing great. Will increase her wellbutrin and recheck 1 month. Call with any concerns.       Relevant Medications   buPROPion (WELLBUTRIN XL) 300 MG 24 hr tablet    Other Visit Diagnoses    Routine general medical examination at a health care facility    -  Primary   Vaccines up to date. Screening labs checked today. Pap done. Mammogram ordered today. Continue diet and exercise. Call with any concerns.    Relevant Orders   CBC with Differential/Platelet   Comprehensive metabolic panel   Lipid Panel w/o Chol/HDL Ratio   TSH   UA/M w/rflx Culture, Routine   Routine screening for STI (sexually transmitted infection)       Labs drawn today. Await results.    Relevant Orders   GC/Chlamydia Probe Amp   HIV antibody   HSV(herpes simplex vrs) 1+2 ab-IgG   Hepatitis, Acute   RPR   Screening for breast cancer       Mammogram ordered today.    Relevant Orders   MM Digital Diagnostic Bilat   Screening for cervical cancer       Pap done today.   Relevant Orders   IGP, Aptima HPV, rfx 16/18,45  Follow up plan: Return in about 1 month (around 02/15/2018) for follow up mood.   LABORATORY TESTING:  - Pap smear: pap done  IMMUNIZATIONS:   - Tdap: Tetanus vaccination status reviewed: last tetanus booster within 10 years. - Influenza: Up to date - Pneumovax: Up to date  SCREENING: -Mammogram: Ordered today  - Colonoscopy: Not applicable   PATIENT COUNSELING:   Advised to take 1 mg of folate supplement per day if capable of pregnancy.   Sexuality: Discussed sexually transmitted diseases, partner selection, use of condoms, avoidance of unintended pregnancy  and contraceptive alternatives.   Advised to avoid cigarette smoking.  I discussed with the patient that most people either  abstain from alcohol or drink within safe limits (<=14/week and <=4 drinks/occasion for males, <=7/weeks and <= 3 drinks/occasion for females) and that the risk for alcohol disorders and other health effects rises proportionally with the number of drinks per week and how often a drinker exceeds daily limits.  Discussed cessation/primary prevention of drug use and availability of treatment for abuse.   Diet: Encouraged to adjust caloric intake to maintain  or achieve ideal body weight, to reduce intake of dietary saturated fat and total fat, to limit sodium intake by avoiding high sodium foods and not adding table salt, and to maintain adequate dietary potassium and calcium preferably from fresh fruits, vegetables, and low-fat dairy products.    stressed the importance of regular exercise  Injury prevention: Discussed safety belts, safety helmets, smoke detector, smoking near bedding or upholstery.   Dental health: Discussed importance of regular tooth brushing, flossing, and dental visits.    NEXT PREVENTATIVE PHYSICAL DUE IN 1 YEAR. Return in about 1 month (around 02/15/2018) for follow up mood.

## 2018-01-19 LAB — TSH: TSH: 0.674 u[IU]/mL (ref 0.450–4.500)

## 2018-01-19 LAB — COMPREHENSIVE METABOLIC PANEL
ALT: 13 IU/L (ref 0–32)
AST: 15 IU/L (ref 0–40)
Albumin/Globulin Ratio: 1.7 (ref 1.2–2.2)
Albumin: 4.6 g/dL (ref 3.5–5.5)
Alkaline Phosphatase: 72 IU/L (ref 39–117)
BUN/Creatinine Ratio: 32 — ABNORMAL HIGH (ref 9–23)
BUN: 20 mg/dL (ref 6–24)
Bilirubin Total: 0.2 mg/dL (ref 0.0–1.2)
CO2: 22 mmol/L (ref 20–29)
Calcium: 9.6 mg/dL (ref 8.7–10.2)
Chloride: 103 mmol/L (ref 96–106)
Creatinine, Ser: 0.63 mg/dL (ref 0.57–1.00)
GFR calc Af Amer: 124 mL/min/{1.73_m2} (ref >59)
GFR calc non Af Amer: 108 mL/min/{1.73_m2} (ref >59)
Globulin, Total: 2.7 g/dL (ref 1.5–4.5)
Glucose: 78 mg/dL (ref 65–99)
Potassium: 4.4 mmol/L (ref 3.5–5.2)
Sodium: 142 mmol/L (ref 134–144)
Total Protein: 7.3 g/dL (ref 6.0–8.5)

## 2018-01-19 LAB — MICROSCOPIC EXAMINATION: Bacteria, UA: NONE SEEN

## 2018-01-19 LAB — CBC WITH DIFFERENTIAL/PLATELET
Basophils Absolute: 0 10*3/uL (ref 0.0–0.2)
Basos: 0 %
EOS (ABSOLUTE): 0.2 10*3/uL (ref 0.0–0.4)
Eos: 3 %
Hematocrit: 38.6 % (ref 34.0–46.6)
Hemoglobin: 12.8 g/dL (ref 11.1–15.9)
Immature Grans (Abs): 0 10*3/uL (ref 0.0–0.1)
Immature Granulocytes: 0 %
Lymphocytes Absolute: 2 10*3/uL (ref 0.7–3.1)
Lymphs: 22 %
MCH: 30 pg (ref 26.6–33.0)
MCHC: 33.2 g/dL (ref 31.5–35.7)
MCV: 90 fL (ref 79–97)
Monocytes Absolute: 0.9 10*3/uL (ref 0.1–0.9)
Monocytes: 10 %
Neutrophils Absolute: 6 10*3/uL (ref 1.4–7.0)
Neutrophils: 65 %
Platelets: 279 10*3/uL (ref 150–379)
RBC: 4.27 x10E6/uL (ref 3.77–5.28)
RDW: 14.5 % (ref 12.3–15.4)
WBC: 9.2 10*3/uL (ref 3.4–10.8)

## 2018-01-19 LAB — LIPID PANEL W/O CHOL/HDL RATIO
Cholesterol, Total: 209 mg/dL — ABNORMAL HIGH (ref 100–199)
HDL: 85 mg/dL (ref >39)
LDL Calculated: 99 mg/dL (ref 0–99)
Triglycerides: 124 mg/dL (ref 0–149)
VLDL Cholesterol Cal: 25 mg/dL (ref 5–40)

## 2018-01-19 LAB — HSV(HERPES SIMPLEX VRS) I + II AB-IGG
HSV 1 Glycoprotein G Ab, IgG: <0.91 index (ref 0.00–0.90)
HSV 2 IgG, Type Spec: <0.91 index (ref 0.00–0.90)

## 2018-01-19 LAB — UA/M W/RFLX CULTURE, ROUTINE
Bilirubin, UA: NEGATIVE
Glucose, UA: NEGATIVE
Ketones, UA: NEGATIVE
Leukocytes, UA: NEGATIVE
Nitrite, UA: NEGATIVE
Protein, UA: NEGATIVE
Specific Gravity, UA: 1.01 (ref 1.005–1.030)
Urobilinogen, Ur: 0.2 mg/dL (ref 0.2–1.0)
pH, UA: 5.5 (ref 5.0–7.5)

## 2018-01-19 LAB — HEPATITIS PANEL, ACUTE
Hep A IgM: NEGATIVE
Hep B C IgM: NEGATIVE
Hep C Virus Ab: <0.1 s/co ratio (ref 0.0–0.9)
Hepatitis B Surface Ag: NEGATIVE

## 2018-01-19 LAB — HIV ANTIBODY (ROUTINE TESTING W REFLEX): HIV Screen 4th Generation wRfx: NONREACTIVE

## 2018-01-19 LAB — RPR: RPR Ser Ql: NONREACTIVE

## 2018-01-20 LAB — GC/CHLAMYDIA PROBE AMP
Chlamydia trachomatis, NAA: NEGATIVE
Neisseria gonorrhoeae by PCR: NEGATIVE

## 2018-01-21 ENCOUNTER — Encounter: Payer: Self-pay | Admitting: Family Medicine

## 2018-01-25 LAB — IGP, APTIMA HPV, RFX 16/18,45
HPV Aptima: POSITIVE — AB
HPV Genotype 16: NEGATIVE
HPV Genotype 18,45: NEGATIVE
PAP Smear Comment: 0

## 2018-01-26 ENCOUNTER — Encounter: Payer: Self-pay | Admitting: Family Medicine

## 2018-01-26 DIAGNOSIS — R87821 Vaginal low risk human papillomavirus (HPV) DNA test positive: Secondary | ICD-10-CM | POA: Insufficient documentation

## 2018-02-06 DIAGNOSIS — L0292 Furuncle, unspecified: Secondary | ICD-10-CM | POA: Diagnosis not present

## 2018-02-07 DIAGNOSIS — H40003 Preglaucoma, unspecified, bilateral: Secondary | ICD-10-CM | POA: Diagnosis not present

## 2018-02-14 ENCOUNTER — Encounter: Payer: Self-pay | Admitting: Family Medicine

## 2018-02-15 MED ORDER — AMOXICILLIN 875 MG PO TABS: 875.0000 mg | ORAL_TABLET | Freq: Two times a day (BID) | ORAL | 0 refills | Status: DC

## 2018-02-22 ENCOUNTER — Ambulatory Visit: Payer: BLUE CROSS/BLUE SHIELD | Admitting: Family Medicine

## 2018-03-04 ENCOUNTER — Other Ambulatory Visit: Payer: Self-pay

## 2018-03-04 NOTE — Telephone Encounter (Signed)
Needs blood work to see if needs this refilled

## 2018-03-04 NOTE — Telephone Encounter (Signed)
Fax from pharmacy. Medication refill request for Vitamin D2 1.25mg  (50,000 units) #4 Capsules. Take 1 capsule each week.  Not in current medication list.

## 2018-03-05 NOTE — Telephone Encounter (Signed)
Patient agreeable upon plan. Has an appointment Friday and stated her labs you recommended can be done then.

## 2018-03-06 DIAGNOSIS — Z1231 Encounter for screening mammogram for malignant neoplasm of breast: Secondary | ICD-10-CM | POA: Diagnosis not present

## 2018-03-06 LAB — HM MAMMOGRAPHY

## 2018-03-08 ENCOUNTER — Encounter: Payer: Self-pay | Admitting: Family Medicine

## 2018-03-08 ENCOUNTER — Ambulatory Visit
Admission: RE | Admit: 2018-03-08 | Discharge: 2018-03-08 | Disposition: A | Discharge status: Home / Self Care | Payer: BLUE CROSS/BLUE SHIELD | Source: Ambulatory Visit | Attending: Family Medicine | Admitting: Family Medicine

## 2018-03-08 ENCOUNTER — Ambulatory Visit (INDEPENDENT_AMBULATORY_CARE_PROVIDER_SITE_OTHER): Payer: BLUE CROSS/BLUE SHIELD | Admitting: Family Medicine

## 2018-03-08 VITALS — BP 123/80 | HR 77 | Temp 98.6°F | Wt 216.4 lb

## 2018-03-08 DIAGNOSIS — G8929 Other chronic pain: Secondary | ICD-10-CM | POA: Diagnosis not present

## 2018-03-08 DIAGNOSIS — M1712 Unilateral primary osteoarthritis, left knee: Secondary | ICD-10-CM | POA: Diagnosis not present

## 2018-03-08 DIAGNOSIS — M25562 Pain in left knee: Secondary | ICD-10-CM | POA: Insufficient documentation

## 2018-03-08 DIAGNOSIS — M47816 Spondylosis without myelopathy or radiculopathy, lumbar region: Secondary | ICD-10-CM | POA: Diagnosis not present

## 2018-03-08 DIAGNOSIS — F331 Major depressive disorder, recurrent, moderate: Secondary | ICD-10-CM | POA: Diagnosis not present

## 2018-03-08 DIAGNOSIS — M7918 Myalgia, other site: Secondary | ICD-10-CM | POA: Insufficient documentation

## 2018-03-08 DIAGNOSIS — M79671 Pain in right foot: Secondary | ICD-10-CM | POA: Diagnosis not present

## 2018-03-08 DIAGNOSIS — F419 Anxiety disorder, unspecified: Secondary | ICD-10-CM

## 2018-03-08 DIAGNOSIS — M25552 Pain in left hip: Secondary | ICD-10-CM

## 2018-03-08 DIAGNOSIS — E559 Vitamin D deficiency, unspecified: Secondary | ICD-10-CM

## 2018-03-08 DIAGNOSIS — M519 Unspecified thoracic, thoracolumbar and lumbosacral intervertebral disc disorder: Secondary | ICD-10-CM | POA: Insufficient documentation

## 2018-03-08 DIAGNOSIS — M1612 Unilateral primary osteoarthritis, left hip: Secondary | ICD-10-CM | POA: Diagnosis not present

## 2018-03-08 MED ORDER — BUPROPION HCL ER (XL) 300 MG PO TB24: 300.0000 mg | ORAL_TABLET | Freq: Every day | ORAL | 1 refills | Status: DC

## 2018-03-08 NOTE — Assessment & Plan Note (Signed)
Better on wellbutrin. Continue current regimen. Continue to monitor. Call with any concerns.

## 2018-03-08 NOTE — Assessment & Plan Note (Signed)
Better on wellbutrin. Continue current regimen. Continue to monitor. Call with any concerns.  

## 2018-03-08 NOTE — Progress Notes (Signed)
BP 123/80 (BP Location: Left Arm, Patient Position: Sitting, Cuff Size: Large)   Pulse 77   Temp 98.6 F (37 C)   Wt 216 lb 7 oz (98.2 kg)   SpO2 98%   BMI 38.34 kg/m    Subjective:    Patient ID: Nancy Sanchez, female    DOB: [birth year], 47 y.o.   MRN: [id number]  HPI: [name] is a 47 y.o. female  Chief Complaint  Patient presents with  . Hip Pain    Left- Would like an xray, would like her handicap placard renewed  . Knee Pain    Left- Would like an xray  . Depression   DEPRESSION Mood status: better Satisfied with current treatment?: yes Symptom severity: moderate  Duration of current treatment : months Side effects: no Medication compliance: good compliance Psychotherapy/counseling: no  Previous psychiatric medications: lexapro, wellbutrin Depressed mood: yes Anxious mood: yes Anhedonia: no Significant weight loss or gain: yes Insomnia: no  Fatigue: yes Feelings of worthlessness or guilt: yes Impaired concentration/indecisiveness: yes Suicidal ideations: no Hopelessness: no Crying spells: yes Depression screen [facility] 2/9 03/08/2018 12/25/2017 05/01/2017  Decreased Interest Down, Depressed, Hopeless PHQ - 2 Score Altered sleeping Tired, decreased energy Change in appetite 1 1 0  Feeling bad or failure about yourself  0 1 0  Trouble concentrating Moving slowly or fidgety/restless 0 0 0  Suicidal thoughts 0 0 0  PHQ-9 Score Difficult doing work/chores Somewhat difficult - -   GAD 7 : Generalized Anxiety Score 03/08/2018 12/25/2017 05/01/2017  Nervous, Anxious, on Edge Control/stop worrying Worry too much - different things Trouble relaxing 0 1 2  Restless Easily annoyed or irritable 0 1 1  Afraid - awful might happen 0 1 2  Total GAD 7 Score Anxiety Difficulty Somewhat difficult - Not difficult at all    HIP PAIN Duration: since 2011 when she had a  trip and fall, has had inner hip injections in the past Involved hip: left  Mechanism of injury: trauma Location: diffuse Onset: sudden  Severity: severe  Quality: burning and sharp Frequency: constant since this weekend with lifting a patient Radiation: no Aggravating factors: lifting, standing    Alleviating factors: rest  Status: worse Treatments attempted: rest, ice, heat and APAP   Relief with NSAIDs?: No NSAIDs Taken Weakness with weight bearing: yes Weakness with walking: no Paresthesias / decreased sensation: no Swelling: no Redness:no Fevers: no  KNEE PAIN- has a history of ACL tear in her knee in the past- had surgery in the past in 2009, has not seen orthopedist in several years.  Duration: 2009 Involved knee: left Mechanism of injury: trauma Location:lateral Onset: sudden Severity: severe  Quality:  stabbing Frequency: intermittent- with standing Radiation: no Aggravating factors:standing, lifting   Alleviating factors:rest   Status: worse Treatments attempted:wrapping it, rubs, tylenol   Relief with NSAIDs?:  no Weakness with weight bearing or walking: no Sensation of giving way: yes Locking: no Popping: yes Bruising: no Swelling: no Redness: no Paresthesias/decreased sensation: no Fevers: no  Continues with pain in her foot- follows with podiatry for that.   Relevant past medical, surgical, family and social history reviewed and updated as indicated. Interim medical history since our  last visit reviewed. Allergies and medications reviewed and updated.  Review of Systems  Constitutional: Negative.   Respiratory: Negative.   Cardiovascular: Negative.   Musculoskeletal: Positive for arthralgias, back pain, joint swelling and myalgias. Negative for gait problem, neck pain and neck stiffness.  Skin: Negative.   Neurological: Negative.   Psychiatric/Behavioral: Negative.     Per HPI unless specifically indicated above     Objective:    BP  123/80 (BP Location: Left Arm, Patient Position: Sitting, Cuff Size: Large)   Pulse 77   Temp 98.6 F (37 C)   Wt 216 lb 7 oz (98.2 kg)   SpO2 98%   BMI 38.34 kg/m   Wt Readings from Last 3 Encounters:  03/08/18 216 lb 7 oz (98.2 kg)  01/18/18 207 lb 3 oz (94 kg)  12/24/17 208 lb 9.6 oz (94.6 kg)    Physical Exam  Results for orders placed or performed in visit on 01/18/18  GC/Chlamydia Probe Amp  Result Value Ref Range   Chlamydia trachomatis, NAA Negative Negative   Neisseria gonorrhoeae by PCR Negative Negative  Microscopic Examination  Result Value Ref Range   WBC, UA 0-5 0 - 5 /hpf   RBC, UA 0-2 0 - 2 /hpf   Epithelial Cells (non renal) 0-10 0 - 10 /hpf   Bacteria, UA None seen None seen/Few  CBC with Differential/Platelet  Result Value Ref Range   WBC 9.2 3.4 - 10.8 x10E3/uL   RBC 4.27 3.77 - 5.28 x10E6/uL   Hemoglobin 12.8 11.1 - 15.9 g/dL   Hematocrit 16.1 09.6 - 46.6 %   MCV 90 79 - 97 fL   MCH 30.0 26.6 - 33.0 pg   MCHC 33.2 31.5 - 35.7 g/dL   RDW 04.5 40.9 - 81.1 %   Platelets 279 150 - 379 x10E3/uL   Neutrophils 65 Not Estab. %   Lymphs 22 Not Estab. %   Monocytes 10 Not Estab. %   Eos 3 Not Estab. %   Basos 0 Not Estab. %   Neutrophils Absolute 6.0 1.4 - 7.0 x10E3/uL   Lymphocytes Absolute 2.0 0.7 - 3.1 x10E3/uL   Monocytes Absolute 0.9 0.1 - 0.9 x10E3/uL   EOS (ABSOLUTE) 0.2 0.0 - 0.4 x10E3/uL   Basophils Absolute 0.0 0.0 - 0.2 x10E3/uL   Immature Granulocytes 0 Not Estab. %   Immature Grans (Abs) 0.0 0.0 - 0.1 x10E3/uL  Comprehensive metabolic panel  Result Value Ref Range   Glucose 78 65 - 99 mg/dL   BUN 20 6 - 24 mg/dL   Creatinine, Ser 9.14 0.57 - 1.00 mg/dL   GFR calc non Af Amer 108 >59 mL/min/1.73   GFR calc Af Amer 124 >59 mL/min/1.73   BUN/Creatinine Ratio 32 (H) 9 - 23   Sodium 142 134 - 144 mmol/L   Potassium 4.4 3.5 - 5.2 mmol/L   Chloride 103 96 - 106 mmol/L   CO2 22 20 - 29 mmol/L   Calcium 9.6 8.7 - 10.2 mg/dL   Total Protein  7.3 6.0 - 8.5 g/dL   Albumin 4.6 3.5 - 5.5 g/dL   Globulin, Total 2.7 1.5 - 4.5 g/dL   Albumin/Globulin Ratio 1.7 1.2 - 2.2   Bilirubin Total 0.2 0.0 - 1.2 mg/dL   Alkaline Phosphatase 72 39 - 117 IU/L   AST 15 0 - 40 IU/L   ALT 13 0 - 32 IU/L  Lipid Panel w/o Chol/HDL Ratio  Result Value Ref Range   Cholesterol, Total 209 (H)  100 - 199 mg/dL   Triglycerides 161 0 - 149 mg/dL   HDL 85 >09 mg/dL   VLDL Cholesterol Cal 25 5 - 40 mg/dL   LDL Calculated 99 0 - 99 mg/dL  TSH  Result Value Ref Range   TSH 0.674 0.450 - 4.500 uIU/mL  UA/M w/rflx Culture, Routine  Result Value Ref Range   Specific Gravity, UA 1.010 1.005 - 1.030   pH, UA 5.5 5.0 - 7.5   Color, UA Yellow Yellow   Appearance Ur Clear Clear   Leukocytes, UA Negative Negative   Protein, UA Negative Negative/Trace   Glucose, UA Negative Negative   Ketones, UA Negative Negative   RBC, UA Trace (A) Negative   Bilirubin, UA Negative Negative   Urobilinogen, Ur 0.2 0.2 - 1.0 mg/dL   Nitrite, UA Negative Negative   Microscopic Examination See below:   HIV antibody  Result Value Ref Range   HIV Screen 4th Generation wRfx Non Reactive Non Reactive  HSV(herpes simplex vrs) 1+2 ab-IgG  Result Value Ref Range   HSV 1 Glycoprotein G Ab, IgG <0.91 0.00 - 0.90 index   HSV 2 IgG, Type Spec <0.91 0.00 - 0.90 index  Hepatitis, Acute  Result Value Ref Range   Hep A IgM Negative Negative   Hepatitis B Surface Ag Negative Negative   Hep B C IgM Negative Negative   Hep C Virus Ab <0.1 0.0 - 0.9 s/co ratio  RPR  Result Value Ref Range   RPR Ser Ql Non Reactive Non Reactive  IGP, Aptima HPV, rfx 16/18,45  Result Value Ref Range   DIAGNOSIS: Comment    Specimen adequacy: Comment    Clinician Provided ICD10 Comment    Performed by: Comment    PAP Smear Comment .    Note: Comment    Test Methodology Comment    HPV Aptima Positive (A) Negative   HPV Genotype 16 Negative Negative   HPV Genotype 18,45 Negative Negative        Assessment & Plan:   Problem List Items Addressed This Visit      Other   Anxiety    Better on wellbutrin. Continue current regimen. Continue to monitor. Call with any concerns.       Relevant Medications   buPROPion (WELLBUTRIN XL) 300 MG 24 hr tablet   Depression - Primary    Better on wellbutrin. Continue current regimen. Continue to monitor. Call with any concerns.       Relevant Medications   buPROPion (WELLBUTRIN XL) 300 MG 24 hr tablet   Vitamin D deficiency    Rechecking levels today. Await results.       Relevant Orders   VITAMIN D 25 Hydroxy (Vit-D Deficiency, Fractures)    Other Visit Diagnoses    Left hip pain       Known arthritis. Referral to ortho and x-ray order placed. Await results.    Relevant Orders   DG HIP UNILAT WITH PELVIS 2-3 VIEWS LEFT   Ambulatory referral to Orthopedic Surgery   Chronic pain of left knee       Known arthritis. Referral to ortho and x-ray order placed. Await results.    Relevant Medications   buPROPion (WELLBUTRIN XL) 300 MG 24 hr tablet   Other Relevant Orders   DG Knee Complete 4 Views Left   Ambulatory referral to Orthopedic Surgery   Left buttock pain       ?due to lumbar DJD. x-ray order placed.   Relevant Orders  DG Lumbar Spine Complete   Ambulatory referral to Orthopedic Surgery   Right foot pain       Referral back to podiatry. Needs new orthotics   Relevant Orders   Ambulatory referral to Podiatry       Follow up plan: Return in about 4 months (around 07/09/2018) for 6 month follow up.

## 2018-03-08 NOTE — Assessment & Plan Note (Signed)
Rechecking levels today. Await results.  

## 2018-03-09 LAB — VITAMIN D 25 HYDROXY (VIT D DEFICIENCY, FRACTURES): Vit D, 25-Hydroxy: 26.1 ng/mL — ABNORMAL LOW (ref 30.0–100.0)

## 2018-03-14 ENCOUNTER — Encounter: Payer: Self-pay | Admitting: Family Medicine

## 2018-03-21 DIAGNOSIS — R928 Other abnormal and inconclusive findings on diagnostic imaging of breast: Secondary | ICD-10-CM | POA: Diagnosis not present

## 2018-03-22 ENCOUNTER — Encounter: Payer: Self-pay | Admitting: Family Medicine

## 2018-03-27 ENCOUNTER — Ambulatory Visit: Payer: BLUE CROSS/BLUE SHIELD | Admitting: Family Medicine

## 2018-03-30 DIAGNOSIS — H8190 Unspecified disorder of vestibular function, unspecified ear: Secondary | ICD-10-CM | POA: Diagnosis not present

## 2018-03-30 DIAGNOSIS — M25812 Other specified joint disorders, left shoulder: Secondary | ICD-10-CM | POA: Diagnosis not present

## 2018-03-30 DIAGNOSIS — J019 Acute sinusitis, unspecified: Secondary | ICD-10-CM | POA: Diagnosis not present

## 2018-03-30 DIAGNOSIS — M7592 Shoulder lesion, unspecified, left shoulder: Secondary | ICD-10-CM | POA: Diagnosis not present

## 2018-04-05 ENCOUNTER — Ambulatory Visit: Payer: BLUE CROSS/BLUE SHIELD | Admitting: Family Medicine

## 2018-04-05 ENCOUNTER — Encounter: Payer: Self-pay | Admitting: Family Medicine

## 2018-04-05 VITALS — BP 123/87 | HR 66 | Temp 98.8°F

## 2018-04-05 DIAGNOSIS — K22719 Barrett's esophagus with dysplasia, unspecified: Secondary | ICD-10-CM

## 2018-04-05 DIAGNOSIS — H8110 Benign paroxysmal vertigo, unspecified ear: Secondary | ICD-10-CM | POA: Diagnosis not present

## 2018-04-05 DIAGNOSIS — K219 Gastro-esophageal reflux disease without esophagitis: Secondary | ICD-10-CM

## 2018-04-05 DIAGNOSIS — Z9884 Bariatric surgery status: Secondary | ICD-10-CM

## 2018-04-05 MED ORDER — OMEPRAZOLE 40 MG PO CPDR: 40.0000 mg | DELAYED_RELEASE_CAPSULE | Freq: Two times a day (BID) | ORAL | 1 refills | Status: DC

## 2018-04-05 MED ORDER — SIMETHICONE 125 MG PO CAPS: 1.0000 | ORAL_CAPSULE | Freq: Two times a day (BID) | ORAL | 3 refills | Status: DC

## 2018-04-05 NOTE — Progress Notes (Signed)
BP 123/87 (BP Location: Left Arm, Patient Position: Sitting, Cuff Size: Large)   Pulse 66   Temp 98.8 F (37.1 C)   SpO2 98%    Subjective:    Patient ID: Nancy Sanchez, female    DOB: [birth year], 47 y.o.   MRN: [id number]  HPI: Nancy Sanchez is a 47 y.o. female  Chief Complaint  Patient presents with  . Gastroesophageal Reflux   Has been having vertigo- just started on amoxicillin by an urgent care a few days ago. Starting to feel better, but continuing to feel like the world is spinning around her when she moves her head. Improving and lessening- still not 100%.  ABDOMINAL ISSUES- Tasting and smelling foul smells- she notes that she has been feeling bloated and gassy, no significant pain- more of a mild discomfort. Has also been having bad reflux. Has not seen GI in a while. Has a history of gastric bypass.  Duration: Past couple of weeks Nature: bloating  Location: diffuse  Severity: moderate  Radiation: no Frequency: intermittent Treatments attempted: PPI Constipation: no Diarrhea: no Mucous in the stool: no Heartburn: yes Bloating:yes Flatulence: yes Nausea: yes Vomiting: no Melena or hematochezia: no Rash: no Jaundice: no Fever: no Weight loss: no  Relevant past medical, surgical, family and social history reviewed and updated as indicated. Interim medical history since our last visit reviewed. Allergies and medications reviewed and updated.  Review of Systems  Constitutional: Negative.   Respiratory: Negative.   Cardiovascular: Negative.   Gastrointestinal: Positive for abdominal distention, abdominal pain and nausea. Negative for anal bleeding, blood in stool, constipation, diarrhea, rectal pain and vomiting.  Musculoskeletal: Negative.   Neurological: Positive for dizziness and light-headedness. Negative for tremors, seizures, syncope, facial asymmetry, speech difficulty, weakness, numbness and headaches.  Hematological: Negative.     Psychiatric/Behavioral: Negative.     Per HPI unless specifically indicated above     Objective:    BP 123/87 (BP Location: Left Arm, Patient Position: Sitting, Cuff Size: Large)   Pulse 66   Temp 98.8 F (37.1 C)   SpO2 98%   Wt Readings from Last 3 Encounters:  03/08/18 216 lb 7 oz (98.2 kg)  01/18/18 207 lb 3 oz (94 kg)  12/24/17 208 lb 9.6 oz (94.6 kg)    Physical Exam  Constitutional: She is oriented to person, place, and time. She appears well-developed and well-nourished. No distress.  HENT:  Head: Normocephalic and atraumatic.  Right Ear: Hearing and external ear normal.  Left Ear: Hearing and external ear normal.  Nose: Nose normal.  Mouth/Throat: Oropharynx is clear and moist. No oropharyngeal exudate.  Eyes: Pupils are equal, round, and reactive to light. Conjunctivae, EOM and lids are normal. Right eye exhibits no discharge. Left eye exhibits no discharge. No scleral icterus.  Neck: Normal range of motion. Neck supple. No JVD present. No tracheal deviation present. No thyromegaly present.  Cardiovascular: Normal rate, regular rhythm, normal heart sounds and intact distal pulses. Exam reveals no gallop and no friction rub.  No murmur heard. Pulmonary/Chest: Effort normal and breath sounds normal. No stridor. No respiratory distress. She has no wheezes. She has no rales. She exhibits no tenderness.  Abdominal: Soft. Bowel sounds are normal. She exhibits no distension and no mass. There is no tenderness. There is no rebound and no guarding. No hernia.  Musculoskeletal: Normal range of motion.  Lymphadenopathy:    She has no cervical adenopathy.  Neurological: She is alert and oriented to person, place,  and time.  Skin: Skin is warm, dry and intact. Capillary refill takes less than 2 seconds. No rash noted. She is not diaphoretic. No erythema. No pallor.  Psychiatric: She has a normal mood and affect. Her speech is normal and behavior is normal. Judgment and thought  content normal. Cognition and memory are normal.  Nursing note and vitals reviewed.   Results for orders placed or performed in visit on 03/22/18  HM MAMMOGRAPHY  Result Value Ref Range   HM Mammogram 0-4 Bi-Rad 0-4 Bi-Rad, Self Reported Normal      Assessment & Plan:   Problem List Items Addressed This Visit      Digestive   GERD (gastroesophageal reflux disease)    Will increase her omeprazole to 40mg  daily and start simethicone. Will get her into see GI ASAP. Call with any concerns. No significant pain.       Relevant Medications   meclizine (ANTIVERT) 25 MG tablet   omeprazole (PRILOSEC) 40 MG capsule   Simethicone 125 MG CAPS   Other Relevant Orders   Ambulatory referral to Gastroenterology   Barrett's esophagus with dysplasia - Primary    Will increase her omeprazole to 40mg  daily and start simethicone. Will get her into see GI ASAP. Call with any concerns. No significant pain.       Relevant Orders   Ambulatory referral to Gastroenterology     Other   History of Roux-en-Y gastric bypass    No significant pain, but concerning for the fecal taste and smell in her mouth. Will get her into see GI ASAP. Referral generated today.       Other Visit Diagnoses    Benign paroxysmal positional vertigo, unspecified laterality       Finish abx. Epley's manuvers given. Call if not improving and we will get her into PT.        Follow up plan: Return if symptoms worsen or fail to improve.

## 2018-04-05 NOTE — Patient Instructions (Signed)
Food Choices for Gastroesophageal Reflux Disease, Adult When you have gastroesophageal reflux disease (GERD), the foods you eat and your eating habits are very important. Choosing the right foods can help ease the discomfort of GERD. Consider working with a diet and nutrition specialist (dietitian) to help you make healthy food choices. What general guidelines should I follow? Eating plan  Choose healthy foods low in fat, such as fruits, vegetables, whole grains, low-fat dairy products, and lean meat, fish, and poultry.  Eat frequent, small meals instead of three large meals each day. Eat your meals slowly, in a relaxed setting. Avoid bending over or lying down until 2-3 hours after eating.  Limit high-fat foods such as fatty meats or fried foods.  Limit your intake of oils, butter, and shortening to less than 8 teaspoons each day.  Avoid the following: ? Foods that cause symptoms. These may be different for different people. Keep a food diary to keep track of foods that cause symptoms. ? Alcohol. ? Drinking large amounts of liquid with meals. ? Eating meals during the 2-3 hours before bed.  Cook foods using methods other than frying. This may include baking, grilling, or broiling. Lifestyle   Maintain a healthy weight. Ask your health care provider what weight is healthy for you. If you need to lose weight, work with your health care provider to do so safely.  Exercise for at least 30 minutes on 5 or more days each week, or as told by your health care provider.  Avoid wearing clothes that fit tightly around your waist and chest.  Do not use any products that contain nicotine or tobacco, such as cigarettes and e-cigarettes. If you need help quitting, ask your health care provider.  Sleep with the head of your bed raised. Use a wedge under the mattress or blocks under the bed frame to raise the head of the bed. What foods are not recommended? The items listed may not be a complete  list. Talk with your dietitian about what dietary choices are best for you. Grains Pastries or quick breads with added fat. French toast. Vegetables Deep fried vegetables. French fries. Any vegetables prepared with added fat. Any vegetables that cause symptoms. For some people this may include tomatoes and tomato products, chili peppers, onions and garlic, and horseradish. Fruits Any fruits prepared with added fat. Any fruits that cause symptoms. For some people this may include citrus fruits, such as oranges, grapefruit, pineapple, and lemons. Meats and other protein foods High-fat meats, such as fatty beef or pork, hot dogs, ribs, ham, sausage, salami and bacon. Fried meat or protein, including fried fish and fried chicken. Nuts and nut butters. Dairy Whole milk and chocolate milk. Sour cream. Cream. Ice cream. Cream cheese. Milk shakes. Beverages Coffee and tea, with or without caffeine. Carbonated beverages. Sodas. Energy drinks. Fruit juice made with acidic fruits (such as [place] or grapefruit). Tomato juice. Alcoholic drinks. Fats and oils Butter. Margarine. Shortening. Ghee. Sweets and desserts Chocolate and cocoa. Donuts. Seasoning and other foods Pepper. Peppermint and spearmint. Any condiments, herbs, or seasonings that cause symptoms. For some people, this may include curry, hot sauce, or vinegar-based salad dressings. Summary  When you have gastroesophageal reflux disease (GERD), food and lifestyle choices are very important to help ease the discomfort of GERD.  Eat frequent, small meals instead of three large meals each day. Eat your meals slowly, in a relaxed setting. Avoid bending over or lying down until 2-3 hours after eating.  Limit high-fat   foods such as fatty meat or fried foods. This information is not intended to replace advice given to you by your health care provider. Make sure you discuss any questions you have with your health care provider. Document Released:  10/09/2005 Document Revised: 10/10/2016 Document Reviewed: 10/10/2016 Elsevier Interactive Patient Education  2018 [employer]  [place] Diet A bland diet consists of foods that do not have a lot of fat or fiber. Foods without fat or fiber are easier for the body to digest. They are also less likely to irritate your mouth, throat, stomach, and other parts of your gastrointestinal tract. A bland diet is sometimes called a BRAT diet. What is my plan? Your health care provider or dietitian may recommend specific changes to your diet to prevent and treat your symptoms, such as:  Eating small meals often.  Cooking food until it is soft enough to chew easily.  Chewing your food well.  Drinking fluids slowly.  Not eating foods that are very spicy, sour, or fatty.  Not eating citrus fruits, such as oranges and grapefruit.  What do I need to know about this diet?  Eat a variety of foods from the bland diet food list.  Do not follow a bland diet longer than you have to.  Ask your health care provider whether you should take vitamins. What foods can I eat? Grains  Hot cereals, such as cream of wheat. Bread, crackers, or tortillas made from refined white flour. Rice. Vegetables Canned or cooked vegetables. Mashed or boiled potatoes. Fruits Bananas. Applesauce. Other types of cooked or canned fruit with the skin and seeds removed, such as canned peaches or pears. Meats and Other Protein Sources Scrambled eggs. Creamy peanut butter or other nut butters. Lean, well-cooked meats, such as chicken or fish. Tofu. Soups or broths. Dairy Low-fat dairy products, such as milk, cottage cheese, or yogurt. Beverages Water. Herbal tea. Apple juice. Sweets and Desserts Pudding. Custard. Fruit gelatin. Ice cream. Fats and Oils Mild salad dressings. Canola or olive oil. The items listed above may not be a complete list of allowed foods or beverages. Contact your dietitian for more options. What foods  are not recommended? Foods and ingredients that are often not recommended include:  Spicy foods, such as hot sauce or salsa.  Fried foods.  Sour foods, such as pickled or fermented foods.  Raw vegetables or fruits, especially citrus or berries.  Caffeinated drinks.  Alcohol.  Strongly flavored seasonings or condiments.  The items listed above may not be a complete list of foods and beverages that are not allowed. Contact your dietitian for more information. This information is not intended to replace advice given to you by your health care provider. Make sure you discuss any questions you have with your health care provider. Document Released: 01/31/2016 Document Revised: 03/16/2016 Document Reviewed: 10/21/2014 Elsevier Interactive Patient Education  2018 [employer]

## 2018-04-07 ENCOUNTER — Encounter: Payer: Self-pay | Admitting: Family Medicine

## 2018-04-07 DIAGNOSIS — K22719 Barrett's esophagus with dysplasia, unspecified: Secondary | ICD-10-CM | POA: Insufficient documentation

## 2018-04-07 NOTE — Assessment & Plan Note (Signed)
Will increase her omeprazole to 40mg daily and start simethicone. Will get her into see GI ASAP. Call with any concerns. No significant pain.  

## 2018-04-07 NOTE — Assessment & Plan Note (Signed)
No significant pain, but concerning for the fecal taste and smell in her mouth. Will get her into see GI ASAP. Referral generated today.

## 2018-04-07 NOTE — Assessment & Plan Note (Signed)
Will increase her omeprazole to 40mg  daily and start simethicone. Will get her into see GI ASAP. Call with any concerns. No significant pain.

## 2018-04-15 DIAGNOSIS — M76821 Posterior tibial tendinitis, right leg: Secondary | ICD-10-CM | POA: Diagnosis not present

## 2018-04-15 DIAGNOSIS — Z87891 Personal history of nicotine dependence: Secondary | ICD-10-CM | POA: Diagnosis not present

## 2018-04-15 DIAGNOSIS — Z8719 Personal history of other diseases of the digestive system: Secondary | ICD-10-CM | POA: Diagnosis not present

## 2018-04-15 DIAGNOSIS — Q666 Other congenital valgus deformities of feet: Secondary | ICD-10-CM | POA: Diagnosis not present

## 2018-04-15 DIAGNOSIS — M79671 Pain in right foot: Secondary | ICD-10-CM | POA: Diagnosis not present

## 2018-04-15 DIAGNOSIS — K219 Gastro-esophageal reflux disease without esophagitis: Secondary | ICD-10-CM | POA: Diagnosis not present

## 2018-04-15 DIAGNOSIS — Z9884 Bariatric surgery status: Secondary | ICD-10-CM | POA: Diagnosis not present

## 2018-04-16 DIAGNOSIS — Z9884 Bariatric surgery status: Secondary | ICD-10-CM | POA: Diagnosis not present

## 2018-04-16 DIAGNOSIS — M1612 Unilateral primary osteoarthritis, left hip: Secondary | ICD-10-CM | POA: Diagnosis not present

## 2018-05-16 ENCOUNTER — Ambulatory Visit: Payer: BLUE CROSS/BLUE SHIELD | Admitting: Family Medicine

## 2018-05-16 ENCOUNTER — Other Ambulatory Visit: Payer: Self-pay

## 2018-05-16 ENCOUNTER — Encounter: Payer: Self-pay | Admitting: Family Medicine

## 2018-05-16 VITALS — BP 142/77 | HR 82 | Temp 98.5°F | Ht 63.0 in | Wt 216.0 lb

## 2018-05-16 DIAGNOSIS — N939 Abnormal uterine and vaginal bleeding, unspecified: Secondary | ICD-10-CM | POA: Insufficient documentation

## 2018-05-16 DIAGNOSIS — B9689 Other specified bacterial agents as the cause of diseases classified elsewhere: Secondary | ICD-10-CM | POA: Diagnosis not present

## 2018-05-16 DIAGNOSIS — R519 Headache, unspecified: Secondary | ICD-10-CM | POA: Insufficient documentation

## 2018-05-16 DIAGNOSIS — R51 Headache: Secondary | ICD-10-CM

## 2018-05-16 DIAGNOSIS — N920 Excessive and frequent menstruation with regular cycle: Secondary | ICD-10-CM | POA: Insufficient documentation

## 2018-05-16 DIAGNOSIS — N760 Acute vaginitis: Secondary | ICD-10-CM

## 2018-05-16 DIAGNOSIS — Z113 Encounter for screening for infections with a predominantly sexual mode of transmission: Secondary | ICD-10-CM | POA: Diagnosis not present

## 2018-05-16 MED ORDER — CLINDAMYCIN HCL 150 MG PO CAPS: 150.0000 mg | ORAL_CAPSULE | Freq: Two times a day (BID) | ORAL | 0 refills | Status: DC

## 2018-05-17 DIAGNOSIS — R300 Dysuria: Secondary | ICD-10-CM | POA: Diagnosis not present

## 2018-05-18 LAB — MICROSCOPIC EXAMINATION: Bacteria, UA: NONE SEEN

## 2018-05-18 LAB — UA/M W/RFLX CULTURE, ROUTINE
Bilirubin, UA: NEGATIVE
Glucose, UA: NEGATIVE
Ketones, UA: NEGATIVE
Leukocytes, UA: NEGATIVE
Nitrite, UA: NEGATIVE
Protein, UA: NEGATIVE
Specific Gravity, UA: 1.025 (ref 1.005–1.030)
Urobilinogen, Ur: 0.2 mg/dL (ref 0.2–1.0)
pH, UA: 5.5 (ref 5.0–7.5)

## 2018-05-18 LAB — WET PREP FOR TRICH, YEAST, CLUE
Clue Cell Exam: POSITIVE — AB
Trichomonas Exam: NEGATIVE
Yeast Exam: NEGATIVE

## 2018-05-19 LAB — GC/CHLAMYDIA PROBE AMP
Chlamydia trachomatis, NAA: NEGATIVE
Neisseria gonorrhoeae by PCR: NEGATIVE

## 2018-05-19 NOTE — Progress Notes (Signed)
BP (!) 142/77   Pulse 82   Temp 98.5 F (36.9 C) (Oral)   Ht 5\' 3"  (1.6 m)   Wt 216 lb (98 kg)   SpO2 95%   BMI 38.26 kg/m    Subjective:    Patient ID: Nancy Sanchez, female    DOB: 01/20/1971, 47 y.o.   MRN: [id number]  HPI: Nancy Sanchez is a 46 y.o. female  Chief Complaint  Patient presents with  . Vaginal problems    irritation, discomfort and lower abdominal pain ongoing for a month ago  . Hip Pain    left sided   Pt here today with about a month of vaginal irritation and lower abdominal pressure. Denies discharge, rashes, odor, dysuria, hematuria, N/V/D, fevers. Trying monistat cream with mild relief. Has had several unprotected sexual encounters recently.   Relevant past medical, surgical, family and social history reviewed and updated as indicated. Interim medical history since our last visit reviewed. Allergies and medications reviewed and updated.  Review of Systems  Per HPI unless specifically indicated above     Objective:    BP (!) 142/77   Pulse 82   Temp 98.5 F (36.9 C) (Oral)   Ht 5\' 3"  (1.6 m)   Wt 216 lb (98 kg)   SpO2 95%   BMI 38.26 kg/m   Wt Readings from Last 3 Encounters:  05/16/18 216 lb (98 kg)  03/08/18 216 lb 7 oz (98.2 kg)  01/18/18 207 lb 3 oz (94 kg)    Physical Exam  Constitutional: She is oriented to person, place, and time. She appears well-developed and well-nourished. No distress.  HENT:  Head: Atraumatic.  Eyes: Conjunctivae are normal.  Neck: Normal range of motion. Neck supple.  Cardiovascular: Normal rate, regular rhythm and normal heart sounds.  Pulmonary/Chest: Effort normal and breath sounds normal.  Abdominal: Soft. Bowel sounds are normal. There is tenderness (mild suprapubic ttp).  Musculoskeletal: Normal range of motion.  Neurological: She is alert and oriented to person, place, and time.  Skin: Skin is warm and dry. No rash noted.  Psychiatric: She has a normal mood and affect. Her behavior  is normal.  Nursing note and vitals reviewed.   Results for orders placed or performed in visit on 05/16/18  WET PREP FOR TRICH, YEAST, CLUE  Result Value Ref Range   Trichomonas Exam Negative Negative   Yeast Exam Negative Negative   Clue Cell Exam Positive (A) Negative  GC/Chlamydia Probe Amp  Result Value Ref Range   Chlamydia trachomatis, NAA Negative Negative   Neisseria gonorrhoeae by PCR Negative Negative  Microscopic Examination  Result Value Ref Range   WBC, UA 0-5 0 - 5 /hpf   RBC, UA 0-2 0 - 2 /hpf   Epithelial Cells (non renal) 0-10 0 - 10 /hpf   Bacteria, UA None seen None seen/Few  UA/M w/rflx Culture, Routine  Result Value Ref Range   Specific Gravity, UA 1.025 1.005 - 1.030   pH, UA 5.5 5.0 - 7.5   Color, UA Yellow Yellow   Appearance Ur Hazy (A) Clear   Leukocytes, UA Negative Negative   Protein, UA Negative Negative/Trace   Glucose, UA Negative Negative   Ketones, UA Negative Negative   RBC, UA Trace (A) Negative   Bilirubin, UA Negative Negative   Urobilinogen, Ur 0.2 0.2 - 1.0 mg/dL   Nitrite, UA Negative Negative   Microscopic Examination See below:   Hepatitis C antibody  Result Value Ref Range  Hep C Virus Ab <0.1 0.0 - 0.9 s/co ratio  HSV(herpes simplex vrs) 1+2 ab-IgG  Result Value Ref Range   HSV 1 Glycoprotein G Ab, IgG WILL FOLLOW    HSV 2 IgG, Type Spec WILL FOLLOW    HSV-2 IgG Supplemental Test WILL FOLLOW   RPR  Result Value Ref Range   RPR Ser Ql Non Reactive Non Reactive  HIV antibody  Result Value Ref Range   HIV Screen 4th Generation wRfx Non Reactive Non Reactive      Assessment & Plan:   Problem List Items Addressed This Visit    None    Visit Diagnoses    BV (bacterial vaginosis)    -  Primary   Wet prep +, tx with clindamycin given flagyl allergy. Probiotics, good vaginal hygiene. F/u if no improvement   Relevant Medications   clindamycin (CLEOCIN) 150 MG capsule   Other Relevant Orders   UA/M w/rflx Culture,  Routine (Completed)   WET PREP FOR TRICH, YEAST, CLUE (Completed)   HIV antibody (Completed)   RPR (Completed)   HSV(herpes simplex vrs) 1+2 ab-IgG (Completed)   Hepatitis C antibody (Completed)   GC/Chlamydia Probe Amp (Completed)   Routine screening for STI (sexually transmitted infection)       Will screen given recent exposures. Tx as needed       Follow up plan: Return if symptoms worsen or fail to improve.

## 2018-05-19 NOTE — Patient Instructions (Signed)
Follow up as needed

## 2018-05-20 LAB — HEPATITIS C ANTIBODY: Hep C Virus Ab: <0.1 s/co ratio (ref 0.0–0.9)

## 2018-05-20 LAB — HIV ANTIBODY (ROUTINE TESTING W REFLEX): HIV Screen 4th Generation wRfx: NONREACTIVE

## 2018-05-20 LAB — HSV-2 IGG SUPPLEMENTAL TEST: HSV-2 IgG Supplemental Test: POSITIVE — AB

## 2018-05-20 LAB — RPR: RPR Ser Ql: NONREACTIVE

## 2018-05-20 LAB — HSV(HERPES SIMPLEX VRS) I + II AB-IGG
HSV 1 Glycoprotein G Ab, IgG: <0.91 index (ref 0.00–0.90)
HSV 2 IgG, Type Spec: 0.91 index — ABNORMAL HIGH (ref 0.00–0.90)

## 2018-05-24 ENCOUNTER — Encounter: Payer: Self-pay | Admitting: Family Medicine

## 2018-05-24 ENCOUNTER — Other Ambulatory Visit: Payer: Self-pay | Admitting: Family Medicine

## 2018-05-24 MED ORDER — CLINDAMYCIN HCL 150 MG PO CAPS
150.0000 mg | ORAL_CAPSULE | Freq: Two times a day (BID) | ORAL | 0 refills | Status: DC
Start: 2018-05-24 — End: 2018-06-20

## 2018-05-30 ENCOUNTER — Encounter: Payer: Self-pay | Admitting: Family Medicine

## 2018-05-30 ENCOUNTER — Other Ambulatory Visit: Payer: Self-pay | Admitting: Family Medicine

## 2018-05-30 MED ORDER — SUCRALFATE 1 G PO TABS: 1.0000 g | ORAL_TABLET | Freq: Three times a day (TID) | ORAL | 0 refills | Status: DC

## 2018-05-30 MED ORDER — ONDANSETRON 4 MG PO TBDP: 4.0000 mg | ORAL_TABLET | Freq: Three times a day (TID) | ORAL | 0 refills | Status: DC | PRN

## 2018-06-03 DIAGNOSIS — Z9884 Bariatric surgery status: Secondary | ICD-10-CM | POA: Diagnosis not present

## 2018-06-03 DIAGNOSIS — M1712 Unilateral primary osteoarthritis, left knee: Secondary | ICD-10-CM | POA: Diagnosis not present

## 2018-06-03 DIAGNOSIS — M25461 Effusion, right knee: Secondary | ICD-10-CM | POA: Diagnosis not present

## 2018-06-03 DIAGNOSIS — A4902 Methicillin resistant Staphylococcus aureus infection, unspecified site: Secondary | ICD-10-CM | POA: Diagnosis not present

## 2018-06-06 DIAGNOSIS — Z9884 Bariatric surgery status: Secondary | ICD-10-CM | POA: Diagnosis not present

## 2018-06-06 DIAGNOSIS — Z980 Intestinal bypass and anastomosis status: Secondary | ICD-10-CM | POA: Diagnosis not present

## 2018-06-06 DIAGNOSIS — Z87891 Personal history of nicotine dependence: Secondary | ICD-10-CM | POA: Diagnosis not present

## 2018-06-06 DIAGNOSIS — Z8719 Personal history of other diseases of the digestive system: Secondary | ICD-10-CM | POA: Diagnosis not present

## 2018-06-06 DIAGNOSIS — R1310 Dysphagia, unspecified: Secondary | ICD-10-CM | POA: Diagnosis not present

## 2018-06-06 DIAGNOSIS — Z886 Allergy status to analgesic agent status: Secondary | ICD-10-CM | POA: Diagnosis not present

## 2018-06-06 DIAGNOSIS — K228 Other specified diseases of esophagus: Secondary | ICD-10-CM | POA: Diagnosis not present

## 2018-06-06 DIAGNOSIS — Z888 Allergy status to other drugs, medicaments and biological substances status: Secondary | ICD-10-CM | POA: Diagnosis not present

## 2018-06-06 DIAGNOSIS — K219 Gastro-esophageal reflux disease without esophagitis: Secondary | ICD-10-CM | POA: Diagnosis not present

## 2018-06-06 DIAGNOSIS — Z79899 Other long term (current) drug therapy: Secondary | ICD-10-CM | POA: Diagnosis not present

## 2018-06-06 DIAGNOSIS — Z8711 Personal history of peptic ulcer disease: Secondary | ICD-10-CM | POA: Diagnosis not present

## 2018-06-06 DIAGNOSIS — Z885 Allergy status to narcotic agent status: Secondary | ICD-10-CM | POA: Diagnosis not present

## 2018-06-07 ENCOUNTER — Encounter: Payer: Self-pay | Admitting: Family Medicine

## 2018-06-07 NOTE — Telephone Encounter (Signed)
FYI

## 2018-06-11 ENCOUNTER — Encounter: Payer: Self-pay | Admitting: Family Medicine

## 2018-06-12 ENCOUNTER — Other Ambulatory Visit: Payer: Self-pay | Admitting: Family Medicine

## 2018-06-14 ENCOUNTER — Other Ambulatory Visit: Payer: Self-pay | Admitting: Family Medicine

## 2018-06-20 ENCOUNTER — Ambulatory Visit: Payer: BLUE CROSS/BLUE SHIELD | Admitting: Physician Assistant

## 2018-06-20 ENCOUNTER — Encounter: Payer: Self-pay | Admitting: Physician Assistant

## 2018-06-20 VITALS — BP 126/86 | HR 89 | Temp 98.8°F | Ht 63.0 in | Wt 219.0 lb

## 2018-06-20 DIAGNOSIS — M79604 Pain in right leg: Secondary | ICD-10-CM | POA: Diagnosis not present

## 2018-06-20 DIAGNOSIS — Z87891 Personal history of nicotine dependence: Secondary | ICD-10-CM | POA: Diagnosis not present

## 2018-06-20 DIAGNOSIS — M7989 Other specified soft tissue disorders: Secondary | ICD-10-CM | POA: Diagnosis not present

## 2018-06-20 DIAGNOSIS — I10 Essential (primary) hypertension: Secondary | ICD-10-CM | POA: Diagnosis not present

## 2018-06-20 DIAGNOSIS — M7121 Synovial cyst of popliteal space [Baker], right knee: Secondary | ICD-10-CM | POA: Diagnosis not present

## 2018-06-20 DIAGNOSIS — M25561 Pain in right knee: Secondary | ICD-10-CM

## 2018-06-20 NOTE — Progress Notes (Signed)
Subjective:    Patient ID: Nancy KinnierKimberly Renee Sanchez, female    DOB: [birth year], 47 y.o.   MRN: [id number]  Nancy Sanchez is a 47 y.o. female presenting on 06/20/2018 for Knee Pain (pt states she has had knee swelling and pain since Monday night / Tuesday morning)   HPI  Patient with history of bilateral knee OA presents today with 1 day of right leg swelling. She reports yesterday her leg was swollen and she wore a brace around her knee. She did some exercise on the bike. Afterwards, she noticed swelling around her right knee and that it was difficulty to move her knee. She noticed redness around her knee and also some bruising around her left knee. She denies fever, chills, nausea vomiting. She denies chest pain and SOB.  Social History   Tobacco Use  . Smoking status: Former Smoker    Last attempt to quit: 10/23/1998    Years since quitting: 19.6  . Smokeless tobacco: Never Used  Substance Use Topics  . Alcohol use: No  . Drug use: No    Review of Systems Per HPI unless specifically indicated above     Objective:    BP 126/86 (BP Location: Left Arm, Cuff Size: Normal)   Pulse 89   Temp 98.8 F (37.1 C) (Oral)   Ht 5\' 3"  (1.6 m)   Wt 219 lb (99.3 kg)   SpO2 96%   BMI 38.79 kg/m   Wt Readings from Last 3 Encounters:  06/20/18 219 lb (99.3 kg)  05/16/18 216 lb (98 kg)  03/08/18 216 lb 7 oz (98.2 kg)    Physical Exam  Constitutional: She is oriented to person, place, and time. She appears well-developed and well-nourished.  Musculoskeletal: She exhibits edema and tenderness.       Right knee: She exhibits decreased range of motion, swelling and ecchymosis. She exhibits no effusion.       Left knee: She exhibits normal range of motion, no swelling, no effusion, no ecchymosis and no deformity. No tenderness found.  Her right calf is visibly swollen, more so than the right calf. There is tenderness in the deep venous system. There is erythema and swelling around her right  knee. ROm is painful in right knee. There is also ecchymosis around medial right knee.   Neurological: She is alert and oriented to person, place, and time.  Skin: Skin is warm and dry.  Psychiatric: She has a normal mood and affect. Her behavior is normal.   Results for orders placed or performed in visit on 05/16/18  WET PREP FOR TRICH, YEAST, CLUE  Result Value Ref Range   Trichomonas Exam Negative Negative   Yeast Exam Negative Negative   Clue Cell Exam Positive (A) Negative  GC/Chlamydia Probe Amp  Result Value Ref Range   Chlamydia trachomatis, NAA Negative Negative   Neisseria gonorrhoeae by PCR Negative Negative  Microscopic Examination  Result Value Ref Range   WBC, UA 0-5 0 - 5 /hpf   RBC, UA 0-2 0 - 2 /hpf   Epithelial Cells (non renal) 0-10 0 - 10 /hpf   Bacteria, UA None seen None seen/Few  UA/M w/rflx Culture, Routine  Result Value Ref Range   Specific Gravity, UA 1.025 1.005 - 1.030   pH, UA 5.5 5.0 - 7.5   Color, UA Yellow Yellow   Appearance Ur Hazy (A) Clear   Leukocytes, UA Negative Negative   Protein, UA Negative Negative/Trace   Glucose, UA Negative Negative  Ketones, UA Negative Negative   RBC, UA Trace (A) Negative   Bilirubin, UA Negative Negative   Urobilinogen, Ur 0.2 0.2 - 1.0 mg/dL   Nitrite, UA Negative Negative   Microscopic Examination See below:   Hepatitis C antibody  Result Value Ref Range   Hep C Virus Ab <0.1 0.0 - 0.9 s/co ratio  HSV(herpes simplex vrs) 1+2 ab-IgG  Result Value Ref Range   HSV 1 Glycoprotein G Ab, IgG <0.91 0.00 - 0.90 index   HSV 2 IgG, Type Spec 0.91 (H) 0.00 - 0.90 index  RPR  Result Value Ref Range   RPR Ser Ql Non Reactive Non Reactive  HIV antibody  Result Value Ref Range   HIV Screen 4th Generation wRfx Non Reactive Non Reactive  HSV-2 IgG Supplemental Test  Result Value Ref Range   HSV-2 IgG Supplemental Test Positive (A) Negative      Assessment & Plan:  1. Acute pain of right knee  DDx: DVT,  Septic arthritis, cellulitis. I have directed her to the ER. She is going to [facility] as [facility] does not have doppler ultrasound after 4 PM.     Follow up plan: Return if symptoms worsen or fail to improve.  Osvaldo Angst, PA-C [facility] 06/20/2018, 5:07 PM

## 2018-06-21 MED ORDER — GENERIC EXTERNAL MEDICATION
1.00 | Status: DC
Start: ? — End: 2018-06-21

## 2018-06-22 DIAGNOSIS — S7011XA Contusion of right thigh, initial encounter: Secondary | ICD-10-CM | POA: Diagnosis not present

## 2018-06-22 DIAGNOSIS — Z79899 Other long term (current) drug therapy: Secondary | ICD-10-CM | POA: Diagnosis not present

## 2018-06-22 DIAGNOSIS — S8011XA Contusion of right lower leg, initial encounter: Secondary | ICD-10-CM | POA: Diagnosis not present

## 2018-06-22 DIAGNOSIS — M79604 Pain in right leg: Secondary | ICD-10-CM | POA: Diagnosis not present

## 2018-06-22 DIAGNOSIS — X58XXXA Exposure to other specified factors, initial encounter: Secondary | ICD-10-CM | POA: Diagnosis not present

## 2018-06-22 DIAGNOSIS — K219 Gastro-esophageal reflux disease without esophagitis: Secondary | ICD-10-CM | POA: Diagnosis not present

## 2018-06-22 DIAGNOSIS — Z87891 Personal history of nicotine dependence: Secondary | ICD-10-CM | POA: Diagnosis not present

## 2018-07-08 ENCOUNTER — Ambulatory Visit: Payer: BLUE CROSS/BLUE SHIELD | Admitting: Family Medicine

## 2018-07-15 ENCOUNTER — Other Ambulatory Visit: Payer: Self-pay | Admitting: Family Medicine

## 2018-07-15 NOTE — Telephone Encounter (Signed)
escitalopram 20 mg refill Last Refill:06/14/18 # 30 Last OV: 06/20/18 PCP: Jose[facility]M. [facility]

## 2018-07-23 ENCOUNTER — Encounter: Payer: Self-pay | Admitting: Family Medicine

## 2018-07-23 ENCOUNTER — Other Ambulatory Visit: Payer: Self-pay

## 2018-07-23 ENCOUNTER — Ambulatory Visit (INDEPENDENT_AMBULATORY_CARE_PROVIDER_SITE_OTHER): Payer: BLUE CROSS/BLUE SHIELD | Admitting: Family Medicine

## 2018-07-23 VITALS — BP 119/67 | HR 64 | Temp 97.8°F | Ht 63.0 in | Wt 211.0 lb

## 2018-07-23 DIAGNOSIS — N3000 Acute cystitis without hematuria: Secondary | ICD-10-CM

## 2018-07-23 DIAGNOSIS — R300 Dysuria: Secondary | ICD-10-CM

## 2018-07-23 LAB — MICROSCOPIC EXAMINATION
Epithelial Cells (non renal): >10 /hpf — AB (ref 0–10)
WBC, UA: >30 /hpf — AB (ref 0–5)

## 2018-07-23 LAB — UA/M W/RFLX CULTURE, ROUTINE
Bilirubin, UA: NEGATIVE
Glucose, UA: NEGATIVE
Ketones, UA: NEGATIVE
Nitrite, UA: POSITIVE — AB
Specific Gravity, UA: 1.025 (ref 1.005–1.030)
Urobilinogen, Ur: 0.2 mg/dL (ref 0.2–1.0)
pH, UA: 6 (ref 5.0–7.5)

## 2018-07-23 MED ORDER — NITROFURANTOIN MONOHYD MACRO 100 MG PO CAPS: 100.0000 mg | ORAL_CAPSULE | Freq: Two times a day (BID) | ORAL | 0 refills | Status: DC

## 2018-07-23 NOTE — Progress Notes (Signed)
BP 119/67   Pulse 64   Temp 97.8 F (36.6 C) (Oral)   Ht 5\' 3"  (1.6 m)   Wt 211 lb (95.7 kg)   SpO2 96%   BMI 37.38 kg/m    Subjective:    Patient ID: Nancy Sanchez, female    DOB: 02-09-1971, 47 y.o.   MRN: [id number]  HPI: Nancy Sanchez is a 47 y.o. female  Chief Complaint  Patient presents with  . Urinary Tract Infection    x 4 days/ vaginal pain, burning, and pressure when urinate   URINARY SYMPTOMS Duration: 4 days Dysuria: yes Urinary frequency: yes Urgency: yes Small volume voids: yes Symptom severity: moderate Urinary incontinence: no Foul odor: yes Hematuria: yes Abdominal pain: no Back pain: no Suprapubic pain/pressure: yes Flank pain: no Fever:  no Vomiting: no Relief with cranberry juice: no Relief with pyridium: no Status: worse Previous urinary tract infection: yes Recurrent urinary tract infection: no History of sexually transmitted disease: no Vaginal discharge: yes Treatments attempted: cranberry and increasing fluids   Relevant past medical, surgical, family and social history reviewed and updated as indicated. Interim medical history since our last visit reviewed. Allergies and medications reviewed and updated.  Review of Systems  Constitutional: Negative.   Respiratory: Negative.   Cardiovascular: Negative.   Genitourinary: Positive for decreased urine volume, dysuria, frequency, hematuria and urgency. Negative for difficulty urinating, dyspareunia, enuresis, flank pain, genital sores, menstrual problem, pelvic pain, vaginal bleeding, vaginal discharge and vaginal pain.  Musculoskeletal: Negative.   Psychiatric/Behavioral: Negative.     Per HPI unless specifically indicated above     Objective:    BP 119/67   Pulse 64   Temp 97.8 F (36.6 C) (Oral)   Ht 5\' 3"  (1.6 m)   Wt 211 lb (95.7 kg)   SpO2 96%   BMI 37.38 kg/m   Wt Readings from Last 3 Encounters:  07/23/18 211 lb (95.7 kg)  06/20/18 219 lb (99.3 kg)    05/16/18 216 lb (98 kg)    Physical Exam  Constitutional: She is oriented to person, place, and time. She appears well-developed and well-nourished. No distress.  HENT:  Head: Normocephalic and atraumatic.  Right Ear: Hearing normal.  Left Ear: Hearing normal.  Nose: Nose normal.  Eyes: Conjunctivae and lids are normal. Right eye exhibits no discharge. Left eye exhibits no discharge. No scleral icterus.  Cardiovascular: Normal rate, regular rhythm, normal heart sounds and intact distal pulses.  No murmur heard. Pulmonary/Chest: Effort normal and breath sounds normal. No stridor. No respiratory distress. She has no wheezes. She has no rales. She exhibits no tenderness.  Abdominal: Soft. Bowel sounds are normal. She exhibits no distension and no mass. There is no tenderness. There is no rebound and no guarding. No hernia.  Musculoskeletal: Normal range of motion.  Neurological: She is alert and oriented to person, place, and time.  Skin: Skin is intact. No rash noted. She is not diaphoretic.  Psychiatric: She has a normal mood and affect. Her speech is normal and behavior is normal. Judgment and thought content normal. Cognition and memory are normal.  Nursing note and vitals reviewed.   Results for orders placed or performed in visit on 05/16/18  WET PREP FOR TRICH, YEAST, CLUE  Result Value Ref Range   Trichomonas Exam Negative Negative   Yeast Exam Negative Negative   Clue Cell Exam Positive (A) Negative  GC/Chlamydia Probe Amp  Result Value Ref Range   Chlamydia trachomatis, NAA Negative  Negative   Neisseria gonorrhoeae by PCR Negative Negative  Microscopic Examination  Result Value Ref Range   WBC, UA 0-5 0 - 5 /hpf   RBC, UA 0-2 0 - 2 /hpf   Epithelial Cells (non renal) 0-10 0 - 10 /hpf   Bacteria, UA None seen None seen/Few  UA/M w/rflx Culture, Routine  Result Value Ref Range   Specific Gravity, UA 1.025 1.005 - 1.030   pH, UA 5.5 5.0 - 7.5   Color, UA Yellow Yellow    Appearance Ur Hazy (A) Clear   Leukocytes, UA Negative Negative   Protein, UA Negative Negative/Trace   Glucose, UA Negative Negative   Ketones, UA Negative Negative   RBC, UA Trace (A) Negative   Bilirubin, UA Negative Negative   Urobilinogen, Ur 0.2 0.2 - 1.0 mg/dL   Nitrite, UA Negative Negative   Microscopic Examination See below:   Hepatitis C antibody  Result Value Ref Range   Hep C Virus Ab <0.1 0.0 - 0.9 s/co ratio  HSV(herpes simplex vrs) 1+2 ab-IgG  Result Value Ref Range   HSV 1 Glycoprotein G Ab, IgG <0.91 0.00 - 0.90 index   HSV 2 IgG, Type Spec 0.91 (H) 0.00 - 0.90 index  RPR  Result Value Ref Range   RPR Ser Ql Non Reactive Non Reactive  HIV antibody  Result Value Ref Range   HIV Screen 4th Generation wRfx Non Reactive Non Reactive  HSV-2 IgG Supplemental Test  Result Value Ref Range   HSV-2 IgG Supplemental Test Positive (A) Negative      Assessment & Plan:   Problem List Items Addressed This Visit    None    Visit Diagnoses    Acute cystitis without hematuria    -  Primary   Will treat with nitrofurantoin and await culture. Call with any concerns. Continue to monitor.    Dysuria       +UA- will treat   Relevant Orders   UA/M w/rflx Culture, Routine   Urine Culture       Follow up plan: Return As scheduled.

## 2018-07-25 LAB — URINE CULTURE

## 2018-07-26 ENCOUNTER — Ambulatory Visit: Payer: BLUE CROSS/BLUE SHIELD | Admitting: Family Medicine

## 2018-07-26 DIAGNOSIS — Z9884 Bariatric surgery status: Secondary | ICD-10-CM | POA: Diagnosis not present

## 2018-07-26 DIAGNOSIS — M86671 Other chronic osteomyelitis, right ankle and foot: Secondary | ICD-10-CM | POA: Diagnosis not present

## 2018-07-26 DIAGNOSIS — A4902 Methicillin resistant Staphylococcus aureus infection, unspecified site: Secondary | ICD-10-CM | POA: Diagnosis not present

## 2018-07-26 DIAGNOSIS — M1712 Unilateral primary osteoarthritis, left knee: Secondary | ICD-10-CM | POA: Diagnosis not present

## 2018-07-26 DIAGNOSIS — K219 Gastro-esophageal reflux disease without esophagitis: Secondary | ICD-10-CM | POA: Diagnosis not present

## 2018-07-26 DIAGNOSIS — I10 Essential (primary) hypertension: Secondary | ICD-10-CM | POA: Diagnosis not present

## 2018-07-29 ENCOUNTER — Ambulatory Visit (INDEPENDENT_AMBULATORY_CARE_PROVIDER_SITE_OTHER): Payer: BLUE CROSS/BLUE SHIELD

## 2018-07-29 DIAGNOSIS — Z23 Encounter for immunization: Secondary | ICD-10-CM | POA: Diagnosis not present

## 2018-08-01 ENCOUNTER — Encounter: Payer: Self-pay | Admitting: Family Medicine

## 2018-08-01 DIAGNOSIS — R300 Dysuria: Secondary | ICD-10-CM

## 2018-08-01 NOTE — Telephone Encounter (Signed)
Please put patient on the lab schedule so that this can be done.

## 2018-08-02 ENCOUNTER — Other Ambulatory Visit: Payer: BLUE CROSS/BLUE SHIELD

## 2018-08-02 DIAGNOSIS — R300 Dysuria: Secondary | ICD-10-CM

## 2018-08-02 LAB — UA/M W/RFLX CULTURE, ROUTINE
Bilirubin, UA: NEGATIVE
Glucose, UA: NEGATIVE
Ketones, UA: NEGATIVE
Nitrite, UA: NEGATIVE
Protein, UA: NEGATIVE
RBC, UA: NEGATIVE
Specific Gravity, UA: 1.02 (ref 1.005–1.030)
Urobilinogen, Ur: 0.2 mg/dL (ref 0.2–1.0)
pH, UA: 5.5 (ref 5.0–7.5)

## 2018-08-02 LAB — MICROSCOPIC EXAMINATION: Bacteria, UA: NONE SEEN

## 2018-08-14 ENCOUNTER — Other Ambulatory Visit: Payer: Self-pay

## 2018-08-14 ENCOUNTER — Encounter: Payer: Self-pay | Admitting: Nurse Practitioner

## 2018-08-14 ENCOUNTER — Ambulatory Visit (INDEPENDENT_AMBULATORY_CARE_PROVIDER_SITE_OTHER): Payer: BLUE CROSS/BLUE SHIELD | Admitting: Nurse Practitioner

## 2018-08-14 DIAGNOSIS — F419 Anxiety disorder, unspecified: Secondary | ICD-10-CM

## 2018-08-14 MED ORDER — BUSPIRONE HCL 5 MG PO TABS: 5.0000 mg | ORAL_TABLET | ORAL | 2 refills | Status: DC | PRN

## 2018-08-14 MED ORDER — BUPROPION HCL ER (XL) 300 MG PO TB24: 300.0000 mg | ORAL_TABLET | Freq: Every day | ORAL | 3 refills | Status: DC

## 2018-08-14 MED ORDER — ESCITALOPRAM OXALATE 20 MG PO TABS: 20.0000 mg | ORAL_TABLET | Freq: Every day | ORAL | 6 refills | Status: DC

## 2018-08-14 MED ORDER — OMEPRAZOLE 40 MG PO CPDR: 40.0000 mg | DELAYED_RELEASE_CAPSULE | Freq: Two times a day (BID) | ORAL | 4 refills | Status: DC

## 2018-08-14 NOTE — Assessment & Plan Note (Signed)
Chronic, ongoing.  Maintained on Wellbutrin and Lexapro.  Will initiate Buspar 5MG  PO BID PRN, as currently has increased stressors.  Provided education on this medication.. To call if any increased anxiety or ADR.

## 2018-08-14 NOTE — Patient Instructions (Signed)
Buspirone tablets What is this medicine? BUSPIRONE (byoo SPYE rone) is used to treat anxiety disorders. This medicine may be used for other purposes; ask your health care provider or pharmacist if you have questions. COMMON BRAND NAME(S): BuSpar What should I tell my health care provider before I take this medicine? They need to know if you have any of these conditions: -kidney or liver disease -an unusual or allergic reaction to buspirone, other medicines, foods, dyes, or preservatives -pregnant or trying to get pregnant -breast-feeding How should I use this medicine? Take this medicine by mouth with a glass of water. Follow the directions on the prescription label. You may take this medicine with or without food. To ensure that this medicine always works the same way for you, you should take it either always with or always without food. Take your doses at regular intervals. Do not take your medicine more often than directed. Do not stop taking except on the advice of your doctor or health care professional. Talk to your pediatrician regarding the use of this medicine in children. Special care may be needed. Overdosage: If you think you have taken too much of this medicine contact a poison control center or emergency room at once. NOTE: This medicine is only for you. Do not share this medicine with others. What if I miss a dose? If you miss a dose, take it as soon as you can. If it is almost time for your next dose, take only that dose. Do not take double or extra doses. What may interact with this medicine? Do not take this medicine with any of the following medications: -linezolid -MAOIs like Carbex, Eldepryl, Marplan, Nardil, and Parnate -methylene blue -procarbazine This medicine may also interact with the following medications: -diazepam -digoxin -diltiazem -erythromycin -grapefruit juice -haloperidol -medicines for mental depression or mood problems -medicines for seizures like  carbamazepine, phenobarbital and phenytoin -nefazodone -other medications for anxiety -rifampin -ritonavir -some antifungal medicines like itraconazole, ketoconazole, and voriconazole -verapamil -warfarin This list may not describe all possible interactions. Give your health care provider a list of all the medicines, herbs, non-prescription drugs, or dietary supplements you use. Also tell them if you smoke, drink alcohol, or use illegal drugs. Some items may interact with your medicine. What should I watch for while using this medicine? Visit your doctor or health care professional for regular checks on your progress. It may take 1 to 2 weeks before your anxiety gets better. You may get drowsy or dizzy. Do not drive, use machinery, or do anything that needs mental alertness until you know how this drug affects you. Do not stand or sit up quickly, especially if you are an older patient. This reduces the risk of dizzy or fainting spells. Alcohol can make you more drowsy and dizzy. Avoid alcoholic drinks. What side effects may I notice from receiving this medicine? Side effects that you should report to your doctor or health care professional as soon as possible: -blurred vision or other vision changes -chest pain -confusion -difficulty breathing -feelings of hostility or anger -muscle aches and pains -numbness or tingling in hands or feet -ringing in the ears -skin rash and itching -vomiting -weakness Side effects that usually do not require medical attention (report to your doctor or health care professional if they continue or are bothersome): -disturbed dreams, nightmares -headache -nausea -restlessness or nervousness -sore throat and nasal congestion -stomach upset This list may not describe all possible side effects. Call your doctor for medical advice about side   effects. You may report side effects to FDA at 1-800-FDA-1088. Where should I keep my medicine? Keep out of the reach  of children. Store at room temperature below 30 degrees C (86 degrees F). Protect from light. Keep container tightly closed. Throw away any unused medicine after the expiration date. NOTE: This sheet is a summary. It may not cover all possible information. If you have questions about this medicine, talk to your doctor, pharmacist, or health care provider.  2018 Elsevier/Gold Standard (2010-05-19 18:06:11)  

## 2018-08-14 NOTE — Progress Notes (Signed)
BP 115/76   Pulse 66   Temp 98.1 F (36.7 C) (Oral)   Ht 5\' 3"  (1.6 m)   Wt 210 lb (95.3 kg)   SpO2 98%   BMI 37.20 kg/m    Subjective:    Patient ID: Nancy Sanchez, female    DOB: [birth year], 47 y.o.   MRN: [id number]  HPI: Nancy Sanchez is a 47 y.o. female presents for visit for anxiety  Chief Complaint  Patient presents with  . Anxiety  . Medication Refill    pt states med refill if needed   ANXIETY/STRESS: Reports increased stressors lately.  Is scheduled for surgery tomorrow.  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  Also has stressor at home with neighbor, currently working on restraining order and has to appear in court, which she is nervous about.  She reports improved anxiety with current maintenance medications, but inquired about a medication she can take as needed.  Discussed addition of Buspar as needed.  Educated on medication risks/benefits and use. Duration:stable Anxious mood: yes  Excessive worrying: yes Irritability: no  Sweating: no Nausea: no Palpitations:no Hyperventilation: no Panic attacks: no Agoraphobia: no  Obscessions/compulsions: no Depressed mood: yes Depression screen [facility] 2/9 08/14/2018 03/08/2018 12/25/2017 05/01/2017  Decreased Interest 1 2 3 1   Down, Depressed, Hopeless 1 2 3 1   PHQ - 2 Score 2 4 6 2   Altered sleeping 1 1 3 1   Tired, decreased energy 2 1 3 1   Change in appetite 0 1 1 0  Feeling bad or failure about yourself  0 0 1 0  Trouble concentrating 3 2 2 1   Moving slowly or fidgety/restless 0 0 0 0  Suicidal thoughts 0 0 0 0  PHQ-9 Score 8 9 16 5   Difficult doing work/chores Not difficult at all Somewhat difficult - -   Anhedonia: no Weight changes: no Insomnia: yes hard to fall asleep  Hypersomnia: no Fatigue/loss of energy: no Feelings of worthlessness: no Feelings of guilt: no Impaired concentration/indecisiveness:  no Suicidal ideations: no  Crying spells: no Recent Stressors/Life Changes: yes   Relationship problems: no   Family stress: no     Financial stress: no    Job stress: yes    Recent death/loss: no  GAD 7 : Generalized Anxiety Score 08/14/2018 03/08/2018 12/25/2017 05/01/2017  Nervous, Anxious, on Edge 3 1 1 2   Control/stop worrying 3 1 1 2   Worry too much - different things 3 1 1 2   Trouble relaxing 3 0 1 2  Restless 2 1 2 2   Easily annoyed or irritable 2 0 1 1  Afraid - awful might happen 3 0 1 2  Total GAD 7 Score 19 4 8 13   Anxiety Difficulty - Somewhat difficult - Not difficult at all     Relevant past medical, surgical, family and social history reviewed and updated as indicated. Interim medical history since our last visit reviewed. Allergies and medications reviewed and updated.  Review of Systems  Constitutional: Negative for activity change, appetite change and fatigue.  Respiratory: Negative for cough, chest tightness and shortness of breath.   Cardiovascular: Negative for chest pain, palpitations and leg swelling.  Gastrointestinal: Negative for abdominal distention, abdominal pain, constipation, diarrhea, nausea and vomiting.  Neurological: Negative for dizziness, numbness and headaches.  Psychiatric/Behavioral: Positive for sleep disturbance. Negative for agitation, behavioral problems, decreased concentration and suicidal ideas. The patient  is nervous/anxious.     Per HPI unless specifically indicated above     Objective:    BP 115/76   Pulse 66   Temp 98.1 F (36.7 C) (Oral)   Ht 5\' 3"  (1.6 m)   Wt 210 lb (95.3 kg)   SpO2 98%   BMI 37.20 kg/m   Wt Readings from Last 3 Encounters:  08/14/18 210 lb (95.3 kg)  07/23/18 211 lb (95.7 kg)  06/20/18 219 lb (99.3 kg)    Physical Exam  Constitutional: She is oriented to person, place, and time. She appears well-developed and well-nourished.  HENT:  Head: Normocephalic and atraumatic.  Eyes: Pupils are equal,  round, and reactive to light.  Neck: Normal range of motion.  Cardiovascular: Normal rate, regular rhythm and normal heart sounds.  Pulmonary/Chest: Effort normal and breath sounds normal.  Abdominal: Soft. Bowel sounds are normal.  Neurological: She is alert and oriented to person, place, and time.  Skin: Skin is warm and dry.  Psychiatric: She has a normal mood and affect. Her behavior is normal. Judgment and thought content normal.    Results for orders placed or performed in visit on 08/02/18  Microscopic Examination  Result Value Ref Range   WBC, UA 0-5 0 - 5 /hpf   RBC, UA 0-2 0 - 2 /hpf   Epithelial Cells (non renal) 0-10 0 - 10 /hpf   Bacteria, UA None seen None seen/Few  UA/M w/rflx Culture, Routine  Result Value Ref Range   Specific Gravity, UA 1.020 1.005 - 1.030   pH, UA 5.5 5.0 - 7.5   Color, UA Yellow Yellow   Appearance Ur Cloudy (A) Clear   Leukocytes, UA Trace (A) Negative   Protein, UA Negative Negative/Trace   Glucose, UA Negative Negative   Ketones, UA Negative Negative   RBC, UA Negative Negative   Bilirubin, UA Negative Negative   Urobilinogen, Ur 0.2 0.2 - 1.0 mg/dL   Nitrite, UA Negative Negative   Microscopic Examination See below:       Assessment & Plan:   Problem List Items Addressed This Visit      Other   Anxiety    Chronic, ongoing.  Maintained on Wellbutrin and Lexapro.  Will initiate Buspar 5MG  PO BID PRN, as currently has increased stressors.  Provided education on this medication.. To call if any increased anxiety or ADR.      Relevant Medications   buPROPion (WELLBUTRIN XL) 300 MG 24 hr tablet   escitalopram (LEXAPRO) 20 MG tablet   busPIRone (BUSPAR) 5 MG tablet       Follow up plan: Return if symptoms worsen or fail to improve.

## 2018-08-15 DIAGNOSIS — J45909 Unspecified asthma, uncomplicated: Secondary | ICD-10-CM | POA: Diagnosis not present

## 2018-08-15 DIAGNOSIS — M1712 Unilateral primary osteoarthritis, left knee: Secondary | ICD-10-CM | POA: Diagnosis not present

## 2018-08-15 DIAGNOSIS — M2352 Chronic instability of knee, left knee: Secondary | ICD-10-CM | POA: Diagnosis not present

## 2018-08-15 DIAGNOSIS — F329 Major depressive disorder, single episode, unspecified: Secondary | ICD-10-CM | POA: Diagnosis not present

## 2018-08-15 DIAGNOSIS — Z9884 Bariatric surgery status: Secondary | ICD-10-CM | POA: Diagnosis not present

## 2018-08-15 DIAGNOSIS — E785 Hyperlipidemia, unspecified: Secondary | ICD-10-CM | POA: Diagnosis not present

## 2018-08-15 DIAGNOSIS — M160 Bilateral primary osteoarthritis of hip: Secondary | ICD-10-CM | POA: Diagnosis not present

## 2018-08-15 DIAGNOSIS — Z6841 Body Mass Index (BMI) 40.0 and over, adult: Secondary | ICD-10-CM | POA: Diagnosis not present

## 2018-08-15 DIAGNOSIS — K2270 Barrett's esophagus without dysplasia: Secondary | ICD-10-CM | POA: Diagnosis not present

## 2018-08-15 DIAGNOSIS — E669 Obesity, unspecified: Secondary | ICD-10-CM | POA: Diagnosis not present

## 2018-08-15 DIAGNOSIS — G43909 Migraine, unspecified, not intractable, without status migrainosus: Secondary | ICD-10-CM | POA: Diagnosis not present

## 2018-08-15 DIAGNOSIS — K219 Gastro-esophageal reflux disease without esophagitis: Secondary | ICD-10-CM | POA: Diagnosis not present

## 2018-08-15 DIAGNOSIS — D649 Anemia, unspecified: Secondary | ICD-10-CM | POA: Diagnosis not present

## 2018-08-15 DIAGNOSIS — G8929 Other chronic pain: Secondary | ICD-10-CM | POA: Diagnosis not present

## 2018-08-15 DIAGNOSIS — F419 Anxiety disorder, unspecified: Secondary | ICD-10-CM | POA: Diagnosis not present

## 2018-08-15 DIAGNOSIS — I10 Essential (primary) hypertension: Secondary | ICD-10-CM | POA: Diagnosis not present

## 2018-08-15 DIAGNOSIS — T84410A Breakdown (mechanical) of muscle and tendon graft, initial encounter: Secondary | ICD-10-CM | POA: Diagnosis not present

## 2018-08-15 DIAGNOSIS — G8918 Other acute postprocedural pain: Secondary | ICD-10-CM | POA: Diagnosis not present

## 2018-08-15 DIAGNOSIS — M21062 Valgus deformity, not elsewhere classified, left knee: Secondary | ICD-10-CM | POA: Diagnosis not present

## 2018-08-15 DIAGNOSIS — Z6837 Body mass index (BMI) 37.0-37.9, adult: Secondary | ICD-10-CM | POA: Diagnosis not present

## 2018-08-16 DIAGNOSIS — G8918 Other acute postprocedural pain: Secondary | ICD-10-CM | POA: Diagnosis not present

## 2018-08-17 MED ORDER — MONTELUKAST SODIUM 10 MG PO TABS
10.00 | ORAL_TABLET | ORAL | Status: DC
Start: 2018-08-16 — End: 2018-08-17

## 2018-08-17 MED ORDER — GABAPENTIN 300 MG PO CAPS
300.00 | ORAL_CAPSULE | ORAL | Status: DC
Start: 2018-08-16 — End: 2018-08-17

## 2018-08-17 MED ORDER — ACETAMINOPHEN 325 MG PO TABS
975.00 | ORAL_TABLET | ORAL | Status: DC
Start: 2018-08-17 — End: 2018-08-17

## 2018-08-17 MED ORDER — HYDROMORPHONE HCL 1 MG/ML IJ SOLN
0.50 | INTRAMUSCULAR | Status: DC
Start: ? — End: 2018-08-17

## 2018-08-17 MED ORDER — LACTATED RINGERS IV SOLN
INTRAVENOUS | Status: DC
Start: ? — End: 2018-08-17

## 2018-08-17 MED ORDER — PROPRANOLOL HCL 20 MG PO TABS
20.00 | ORAL_TABLET | ORAL | Status: DC
Start: 2018-08-17 — End: 2018-08-17

## 2018-08-17 MED ORDER — LIDOCAINE HCL 1 % IJ SOLN
0.50 | INTRAMUSCULAR | Status: DC
Start: ? — End: 2018-08-17

## 2018-08-17 MED ORDER — GENERIC EXTERNAL MEDICATION
Status: DC
Start: ? — End: 2018-08-17

## 2018-08-17 MED ORDER — VITAMIN C 500 MG PO TABS
500.00 | ORAL_TABLET | ORAL | Status: DC
Start: 2018-08-17 — End: 2018-08-17

## 2018-08-17 MED ORDER — SULFAMETHOXAZOLE-TRIMETHOPRIM 800-160 MG PO TABS
1.00 | ORAL_TABLET | ORAL | Status: DC
Start: 2018-08-16 — End: 2018-08-17

## 2018-08-17 MED ORDER — POLYETHYLENE GLYCOL 3350 17 G PO PACK
17.00 | PACK | ORAL | Status: DC
Start: 2018-08-17 — End: 2018-08-17

## 2018-08-17 MED ORDER — BUDESONIDE-FORMOTEROL FUMARATE 160-4.5 MCG/ACT IN AERO
2.00 | INHALATION_SPRAY | RESPIRATORY_TRACT | Status: DC
Start: 2018-08-17 — End: 2018-08-17

## 2018-08-17 MED ORDER — BUPROPION HCL ER (XL) 150 MG PO TB24
300.00 | ORAL_TABLET | ORAL | Status: DC
Start: 2018-08-17 — End: 2018-08-17

## 2018-08-17 MED ORDER — SENNOSIDES-DOCUSATE SODIUM 8.6-50 MG PO TABS
1.00 | ORAL_TABLET | ORAL | Status: DC
Start: 2018-08-17 — End: 2018-08-17

## 2018-08-17 MED ORDER — ENOXAPARIN SODIUM 40 MG/0.4ML ~~LOC~~ SOLN
40.00 | SUBCUTANEOUS | Status: DC
Start: 2018-08-17 — End: 2018-08-17

## 2018-08-17 MED ORDER — ALBUTEROL SULFATE HFA 108 (90 BASE) MCG/ACT IN AERS
2.00 | INHALATION_SPRAY | RESPIRATORY_TRACT | Status: DC
Start: ? — End: 2018-08-17

## 2018-08-17 MED ORDER — ESCITALOPRAM OXALATE 10 MG PO TABS
20.00 | ORAL_TABLET | ORAL | Status: DC
Start: 2018-08-17 — End: 2018-08-17

## 2018-08-17 MED ORDER — GENERIC EXTERNAL MEDICATION
4.00 | Status: DC
Start: ? — End: 2018-08-17

## 2018-08-17 MED ORDER — HYDROMORPHONE HCL 2 MG PO TABS
2.00 | ORAL_TABLET | ORAL | Status: DC
Start: ? — End: 2018-08-17

## 2018-08-17 MED ORDER — PANTOPRAZOLE SODIUM 40 MG PO TBEC
40.00 | DELAYED_RELEASE_TABLET | ORAL | Status: DC
Start: 2018-08-17 — End: 2018-08-17

## 2018-08-17 MED ORDER — BISACODYL 10 MG RE SUPP
10.00 | RECTAL | Status: DC
Start: ? — End: 2018-08-17

## 2018-08-26 DIAGNOSIS — Z01818 Encounter for other preprocedural examination: Secondary | ICD-10-CM | POA: Diagnosis not present

## 2018-08-26 DIAGNOSIS — G8929 Other chronic pain: Secondary | ICD-10-CM | POA: Diagnosis not present

## 2018-08-26 DIAGNOSIS — M25562 Pain in left knee: Secondary | ICD-10-CM | POA: Diagnosis not present

## 2018-08-26 DIAGNOSIS — Z4789 Encounter for other orthopedic aftercare: Secondary | ICD-10-CM | POA: Diagnosis not present

## 2018-08-29 DIAGNOSIS — Z01818 Encounter for other preprocedural examination: Secondary | ICD-10-CM | POA: Diagnosis not present

## 2018-08-29 DIAGNOSIS — M25562 Pain in left knee: Secondary | ICD-10-CM | POA: Diagnosis not present

## 2018-08-29 DIAGNOSIS — Z4789 Encounter for other orthopedic aftercare: Secondary | ICD-10-CM | POA: Diagnosis not present

## 2018-08-29 DIAGNOSIS — G8929 Other chronic pain: Secondary | ICD-10-CM | POA: Diagnosis not present

## 2018-09-13 DIAGNOSIS — Z4789 Encounter for other orthopedic aftercare: Secondary | ICD-10-CM | POA: Diagnosis not present

## 2018-09-13 DIAGNOSIS — Z01818 Encounter for other preprocedural examination: Secondary | ICD-10-CM | POA: Diagnosis not present

## 2018-09-13 DIAGNOSIS — G8929 Other chronic pain: Secondary | ICD-10-CM | POA: Diagnosis not present

## 2018-09-13 DIAGNOSIS — M25562 Pain in left knee: Secondary | ICD-10-CM | POA: Diagnosis not present

## 2018-09-17 DIAGNOSIS — M25562 Pain in left knee: Secondary | ICD-10-CM | POA: Diagnosis not present

## 2018-09-17 DIAGNOSIS — Z4789 Encounter for other orthopedic aftercare: Secondary | ICD-10-CM | POA: Diagnosis not present

## 2018-09-17 DIAGNOSIS — Z01818 Encounter for other preprocedural examination: Secondary | ICD-10-CM | POA: Diagnosis not present

## 2018-09-17 DIAGNOSIS — G8929 Other chronic pain: Secondary | ICD-10-CM | POA: Diagnosis not present

## 2018-09-24 ENCOUNTER — Encounter: Payer: Self-pay | Admitting: Nurse Practitioner

## 2018-09-24 ENCOUNTER — Ambulatory Visit: Payer: BLUE CROSS/BLUE SHIELD | Admitting: Nurse Practitioner

## 2018-09-24 VITALS — BP 132/80 | HR 89 | Temp 98.4°F | Ht 63.0 in | Wt 207.1 lb

## 2018-09-24 DIAGNOSIS — Z01818 Encounter for other preprocedural examination: Secondary | ICD-10-CM | POA: Diagnosis not present

## 2018-09-24 DIAGNOSIS — R300 Dysuria: Secondary | ICD-10-CM | POA: Insufficient documentation

## 2018-09-24 DIAGNOSIS — G8929 Other chronic pain: Secondary | ICD-10-CM | POA: Diagnosis not present

## 2018-09-24 DIAGNOSIS — M25562 Pain in left knee: Secondary | ICD-10-CM | POA: Diagnosis not present

## 2018-09-24 DIAGNOSIS — Z4789 Encounter for other orthopedic aftercare: Secondary | ICD-10-CM | POA: Diagnosis not present

## 2018-09-24 LAB — URINALYSIS, ROUTINE W REFLEX MICROSCOPIC
Bilirubin, UA: NEGATIVE
Glucose, UA: NEGATIVE
Ketones, UA: NEGATIVE
Leukocytes, UA: NEGATIVE
Nitrite, UA: NEGATIVE
Protein, UA: NEGATIVE
Specific Gravity, UA: 1.02 (ref 1.005–1.030)
Urobilinogen, Ur: 0.2 mg/dL (ref 0.2–1.0)
pH, UA: 7 (ref 5.0–7.5)

## 2018-09-24 LAB — MICROSCOPIC EXAMINATION
Bacteria, UA: NONE SEEN
RBC, UA: NONE SEEN /hpf (ref 0–2)
Renal Epithel, UA: NONE SEEN /hpf
WBC, UA: NONE SEEN /hpf (ref 0–5)

## 2018-09-24 LAB — WET PREP FOR TRICH, YEAST, CLUE
Clue Cell Exam: NEGATIVE
Trichomonas Exam: NEGATIVE
Yeast Exam: NEGATIVE

## 2018-09-24 NOTE — Assessment & Plan Note (Signed)
Urine trace blood.  Wet prep negative.  Urine sent for Gon/Chlam.  No abx treatment at this time.  Encouraged increased fluid intake and continued cranberry juice intake.

## 2018-09-24 NOTE — Progress Notes (Signed)
BP 132/80 (BP Location: Left Arm, Patient Position: Sitting, Cuff Size: Normal)   Pulse 89   Temp 98.4 F (36.9 C)   Ht 5\' 3"  (1.6 m)   Wt 207 lb 2 oz (94 kg)   SpO2 99%   BMI 36.69 kg/m    Subjective:    Patient ID: Nancy Sanchez, female    DOB: May 01, 1971, 47 y.o.   MRN: [id number]  HPI: Nancy Sanchez is a 46 y.o. female presents for dysuria  Chief Complaint  Patient presents with  . Vaginal Pain   URINARY SYMPTOMS & VAGINAL PRESSURE Had UTI prior to recent surgery, which was recently treated.  She reports ongoing feeling pressure since sexual intercourse prior to her surgery and prior to previous UTI.  She reports having intercourse with a gentleman who was "more endowed". Reports concerns for "damage".  No vaginal discharge or odor.  Is not monogamous with this partner, he is "one of two".  Has HSV IgG positive testing in 05/17/18, no report of outbreaks.  Last time she had sexual intercourse was Sunday, denies pain with intercourse.  No vaginal bleeding.  LMP two weeks ago.  USe coconut oil for vaginal comfort/lubrication. Dysuria: no Urinary frequency: no Urgency: no Small volume voids: no Symptom severity: mild Urinary incontinence: no Foul odor: no Hematuria: no Abdominal pain: no Back pain: no Suprapubic pain/pressure: no Flank pain: no Fever:  no Vomiting: no Relief with cranberry juice: has been drinking, but has not improved Relief with pyridium: no Status: stable Previous urinary tract infection: yes Recurrent urinary tract infection: no Sexual activity: presently active History of sexually transmitted disease: no Treatments attempted: cranberry   Relevant past medical, surgical, family and social history reviewed and updated as indicated. Interim medical history since our last visit reviewed. Allergies and medications reviewed and updated.  Review of Systems  Constitutional: Negative for activity change, appetite change, fatigue and  fever.  Respiratory: Negative for cough, chest tightness and shortness of breath.   Cardiovascular: Negative for chest pain, palpitations and leg swelling.  Gastrointestinal: Negative for abdominal distention and abdominal pain.  Genitourinary: Positive for vaginal pain. Negative for decreased urine volume, dyspareunia, dysuria, frequency, hematuria, urgency, vaginal bleeding and vaginal discharge.  Neurological: Negative for dizziness, numbness and headaches.    Per HPI unless specifically indicated above     Objective:    BP 132/80 (BP Location: Left Arm, Patient Position: Sitting, Cuff Size: Normal)   Pulse 89   Temp 98.4 F (36.9 C)   Ht 5\' 3"  (1.6 m)   Wt 207 lb 2 oz (94 kg)   SpO2 99%   BMI 36.69 kg/m   Wt Readings from Last 3 Encounters:  09/24/18 207 lb 2 oz (94 kg)  08/14/18 210 lb (95.3 kg)  07/23/18 211 lb (95.7 kg)    Physical Exam  Constitutional: She is oriented to person, place, and time. She appears well-developed and well-nourished.  HENT:  Head: Normocephalic.  Eyes: Pupils are equal, round, and reactive to light. Conjunctivae and EOM are normal. Right eye exhibits no discharge. Left eye exhibits no discharge.  Neck: Normal range of motion. Neck supple. No JVD present. Carotid bruit is not present. No thyromegaly present.  Cardiovascular: Normal rate, regular rhythm and normal heart sounds.  Pulmonary/Chest: Effort normal and breath sounds normal.  Abdominal: Soft. Bowel sounds are normal.  Genitourinary: Vagina normal. Cervix exhibits no motion tenderness and no discharge.  Genitourinary Comments: Mild erythema to external labia, no lesions.  Wet prep noted scant amount old brownish blood on swab.  Lymphadenopathy:    She has no cervical adenopathy.  Neurological: She is alert and oriented to person, place, and time.  Skin: Skin is warm and dry.  Psychiatric: She has a normal mood and affect. Her behavior is normal. Judgment and thought content normal.    Nursing note and vitals reviewed.   Results for orders placed or performed in visit on 09/24/18  WET PREP FOR TRICH, YEAST, CLUE  Result Value Ref Range   Trichomonas Exam Negative Negative   Yeast Exam Negative Negative   Clue Cell Exam Negative Negative  Microscopic Examination  Result Value Ref Range   WBC, UA None seen 0 - 5 /hpf   RBC, UA None seen 0 - 2 /hpf   Epithelial Cells (non renal) 0-10 0 - 10 /hpf   Renal Epithel, UA None seen None seen /hpf   Bacteria, UA None seen None seen/Few  Urinalysis, Routine w reflex microscopic  Result Value Ref Range   Specific Gravity, UA 1.020 1.005 - 1.030   pH, UA 7.0 5.0 - 7.5   Color, UA Yellow Yellow   Appearance Ur Clear Clear   Leukocytes, UA Negative Negative   Protein, UA Negative Negative/Trace   Glucose, UA Negative Negative   Ketones, UA Negative Negative   RBC, UA Trace (A) Negative   Bilirubin, UA Negative Negative   Urobilinogen, Ur 0.2 0.2 - 1.0 mg/dL   Nitrite, UA Negative Negative   Microscopic Examination See below:       Assessment & Plan:   Problem List Items Addressed This Visit      Other   Dysuria - Primary    Urine trace blood.  Wet prep negative.  Urine sent for Gon/Chlam.  No abx treatment at this time.  Encouraged increased fluid intake and continued cranberry juice intake.      Relevant Orders   Urinalysis, Routine w reflex microscopic (Completed)   GC/Chlamydia Probe Amp   WET PREP FOR TRICH, YEAST, CLUE (Completed)       Follow up plan: Return if symptoms worsen or fail to improve.

## 2018-09-24 NOTE — Patient Instructions (Signed)

## 2018-09-26 DIAGNOSIS — Z01818 Encounter for other preprocedural examination: Secondary | ICD-10-CM | POA: Diagnosis not present

## 2018-09-26 DIAGNOSIS — Z4789 Encounter for other orthopedic aftercare: Secondary | ICD-10-CM | POA: Diagnosis not present

## 2018-09-26 DIAGNOSIS — M25562 Pain in left knee: Secondary | ICD-10-CM | POA: Diagnosis not present

## 2018-09-26 DIAGNOSIS — G8929 Other chronic pain: Secondary | ICD-10-CM | POA: Diagnosis not present

## 2018-09-26 LAB — GC/CHLAMYDIA PROBE AMP
Chlamydia trachomatis, NAA: NEGATIVE
Neisseria gonorrhoeae by PCR: NEGATIVE

## 2018-09-30 DIAGNOSIS — M1712 Unilateral primary osteoarthritis, left knee: Secondary | ICD-10-CM | POA: Diagnosis not present

## 2018-09-30 DIAGNOSIS — Z96652 Presence of left artificial knee joint: Secondary | ICD-10-CM | POA: Insufficient documentation

## 2018-09-30 DIAGNOSIS — M7989 Other specified soft tissue disorders: Secondary | ICD-10-CM | POA: Diagnosis not present

## 2018-09-30 DIAGNOSIS — M21061 Valgus deformity, not elsewhere classified, right knee: Secondary | ICD-10-CM | POA: Diagnosis not present

## 2018-10-02 DIAGNOSIS — Z4789 Encounter for other orthopedic aftercare: Secondary | ICD-10-CM | POA: Diagnosis not present

## 2018-10-02 DIAGNOSIS — Z01818 Encounter for other preprocedural examination: Secondary | ICD-10-CM | POA: Diagnosis not present

## 2018-10-02 DIAGNOSIS — M25562 Pain in left knee: Secondary | ICD-10-CM | POA: Diagnosis not present

## 2018-10-02 DIAGNOSIS — G8929 Other chronic pain: Secondary | ICD-10-CM | POA: Diagnosis not present

## 2018-10-07 DIAGNOSIS — Z4789 Encounter for other orthopedic aftercare: Secondary | ICD-10-CM | POA: Diagnosis not present

## 2018-10-07 DIAGNOSIS — Z01818 Encounter for other preprocedural examination: Secondary | ICD-10-CM | POA: Diagnosis not present

## 2018-10-07 DIAGNOSIS — M25562 Pain in left knee: Secondary | ICD-10-CM | POA: Diagnosis not present

## 2018-10-07 DIAGNOSIS — G8929 Other chronic pain: Secondary | ICD-10-CM | POA: Diagnosis not present

## 2018-10-18 DIAGNOSIS — G8929 Other chronic pain: Secondary | ICD-10-CM | POA: Diagnosis not present

## 2018-10-18 DIAGNOSIS — Z4789 Encounter for other orthopedic aftercare: Secondary | ICD-10-CM | POA: Diagnosis not present

## 2018-10-18 DIAGNOSIS — M25562 Pain in left knee: Secondary | ICD-10-CM | POA: Diagnosis not present

## 2018-10-18 DIAGNOSIS — Z01818 Encounter for other preprocedural examination: Secondary | ICD-10-CM | POA: Diagnosis not present

## 2018-10-21 DIAGNOSIS — Z4789 Encounter for other orthopedic aftercare: Secondary | ICD-10-CM | POA: Diagnosis not present

## 2018-10-21 DIAGNOSIS — M25562 Pain in left knee: Secondary | ICD-10-CM | POA: Diagnosis not present

## 2018-10-21 DIAGNOSIS — G8929 Other chronic pain: Secondary | ICD-10-CM | POA: Diagnosis not present

## 2018-10-21 DIAGNOSIS — Z01818 Encounter for other preprocedural examination: Secondary | ICD-10-CM | POA: Diagnosis not present

## 2018-12-10 DIAGNOSIS — S46009A Unspecified injury of muscle(s) and tendon(s) of the rotator cuff of unspecified shoulder, initial encounter: Secondary | ICD-10-CM | POA: Insufficient documentation

## 2018-12-10 DIAGNOSIS — M19011 Primary osteoarthritis, right shoulder: Secondary | ICD-10-CM | POA: Diagnosis not present

## 2018-12-10 DIAGNOSIS — Y92002 Bathroom of unspecified non-institutional (private) residence single-family (private) house as the place of occurrence of the external cause: Secondary | ICD-10-CM | POA: Diagnosis not present

## 2018-12-10 DIAGNOSIS — W172XXA Fall into hole, initial encounter: Secondary | ICD-10-CM | POA: Diagnosis not present

## 2018-12-12 DIAGNOSIS — S93411A Sprain of calcaneofibular ligament of right ankle, initial encounter: Secondary | ICD-10-CM | POA: Insufficient documentation

## 2018-12-12 DIAGNOSIS — W19XXXA Unspecified fall, initial encounter: Secondary | ICD-10-CM | POA: Diagnosis not present

## 2018-12-12 DIAGNOSIS — M79671 Pain in right foot: Secondary | ICD-10-CM | POA: Diagnosis not present

## 2018-12-14 DIAGNOSIS — M19011 Primary osteoarthritis, right shoulder: Secondary | ICD-10-CM | POA: Diagnosis not present

## 2018-12-14 DIAGNOSIS — M75111 Incomplete rotator cuff tear or rupture of right shoulder, not specified as traumatic: Secondary | ICD-10-CM | POA: Diagnosis not present

## 2018-12-14 DIAGNOSIS — X58XXXA Exposure to other specified factors, initial encounter: Secondary | ICD-10-CM | POA: Diagnosis not present

## 2018-12-14 DIAGNOSIS — S46001A Unspecified injury of muscle(s) and tendon(s) of the rotator cuff of right shoulder, initial encounter: Secondary | ICD-10-CM | POA: Diagnosis not present

## 2018-12-14 DIAGNOSIS — M25511 Pain in right shoulder: Secondary | ICD-10-CM | POA: Diagnosis not present

## 2018-12-27 DIAGNOSIS — M75111 Incomplete rotator cuff tear or rupture of right shoulder, not specified as traumatic: Secondary | ICD-10-CM | POA: Diagnosis not present

## 2018-12-27 DIAGNOSIS — M67911 Unspecified disorder of synovium and tendon, right shoulder: Secondary | ICD-10-CM | POA: Diagnosis not present

## 2018-12-27 DIAGNOSIS — M7541 Impingement syndrome of right shoulder: Secondary | ICD-10-CM | POA: Diagnosis not present

## 2018-12-28 DIAGNOSIS — Z01818 Encounter for other preprocedural examination: Secondary | ICD-10-CM | POA: Diagnosis not present

## 2018-12-28 DIAGNOSIS — Z4789 Encounter for other orthopedic aftercare: Secondary | ICD-10-CM | POA: Diagnosis not present

## 2018-12-28 DIAGNOSIS — G8929 Other chronic pain: Secondary | ICD-10-CM | POA: Diagnosis not present

## 2018-12-28 DIAGNOSIS — M5442 Lumbago with sciatica, left side: Secondary | ICD-10-CM | POA: Diagnosis not present

## 2018-12-28 DIAGNOSIS — M25562 Pain in left knee: Secondary | ICD-10-CM | POA: Diagnosis not present

## 2019-01-04 DIAGNOSIS — M25562 Pain in left knee: Secondary | ICD-10-CM | POA: Diagnosis not present

## 2019-01-04 DIAGNOSIS — Z4789 Encounter for other orthopedic aftercare: Secondary | ICD-10-CM | POA: Diagnosis not present

## 2019-01-04 DIAGNOSIS — G8929 Other chronic pain: Secondary | ICD-10-CM | POA: Diagnosis not present

## 2019-01-04 DIAGNOSIS — M5442 Lumbago with sciatica, left side: Secondary | ICD-10-CM | POA: Diagnosis not present

## 2019-01-04 DIAGNOSIS — Z01818 Encounter for other preprocedural examination: Secondary | ICD-10-CM | POA: Diagnosis not present

## 2019-01-10 ENCOUNTER — Other Ambulatory Visit: Payer: Self-pay | Admitting: Nurse Practitioner

## 2019-01-11 DIAGNOSIS — Z01818 Encounter for other preprocedural examination: Secondary | ICD-10-CM | POA: Diagnosis not present

## 2019-01-11 DIAGNOSIS — G8929 Other chronic pain: Secondary | ICD-10-CM | POA: Diagnosis not present

## 2019-01-11 DIAGNOSIS — M25562 Pain in left knee: Secondary | ICD-10-CM | POA: Diagnosis not present

## 2019-01-11 DIAGNOSIS — Z4789 Encounter for other orthopedic aftercare: Secondary | ICD-10-CM | POA: Diagnosis not present

## 2019-01-11 DIAGNOSIS — M5442 Lumbago with sciatica, left side: Secondary | ICD-10-CM | POA: Diagnosis not present

## 2019-01-13 ENCOUNTER — Encounter: Payer: Self-pay | Admitting: Family Medicine

## 2019-01-14 ENCOUNTER — Ambulatory Visit: Payer: BLUE CROSS/BLUE SHIELD | Admitting: Family Medicine

## 2019-01-15 ENCOUNTER — Encounter: Payer: Self-pay | Admitting: Family Medicine

## 2019-01-15 ENCOUNTER — Ambulatory Visit (INDEPENDENT_AMBULATORY_CARE_PROVIDER_SITE_OTHER): Payer: BLUE CROSS/BLUE SHIELD | Admitting: Family Medicine

## 2019-01-15 ENCOUNTER — Other Ambulatory Visit: Payer: Self-pay

## 2019-01-15 VITALS — BP 129/76 | HR 76 | Temp 98.3°F | Ht 62.5 in | Wt 209.0 lb

## 2019-01-15 DIAGNOSIS — D508 Other iron deficiency anemias: Secondary | ICD-10-CM

## 2019-01-15 DIAGNOSIS — R232 Flushing: Secondary | ICD-10-CM | POA: Diagnosis not present

## 2019-01-15 DIAGNOSIS — I471 Supraventricular tachycardia: Secondary | ICD-10-CM | POA: Diagnosis not present

## 2019-01-15 DIAGNOSIS — F330 Major depressive disorder, recurrent, mild: Secondary | ICD-10-CM

## 2019-01-15 DIAGNOSIS — Z13220 Encounter for screening for lipoid disorders: Secondary | ICD-10-CM

## 2019-01-15 LAB — BAYER DCA HB A1C WAIVED: HB A1C (BAYER DCA - WAIVED): 5.1 % (ref <7.0)

## 2019-01-15 MED ORDER — BUSPIRONE HCL 5 MG PO TABS: 5.0000 mg | ORAL_TABLET | ORAL | 1 refills | Status: DC | PRN

## 2019-01-15 MED ORDER — ESCITALOPRAM OXALATE 20 MG PO TABS: 20.0000 mg | ORAL_TABLET | Freq: Every day | ORAL | 1 refills | Status: DC

## 2019-01-15 MED ORDER — GABAPENTIN 300 MG PO CAPS: 600.0000 mg | ORAL_CAPSULE | Freq: Two times a day (BID) | ORAL | 1 refills | Status: DC

## 2019-01-15 NOTE — Progress Notes (Signed)
BP 129/76   Pulse 76   Temp 98.3 F (36.8 C) (Oral)   Ht 5' 2.5" (1.588 m)   Wt 209 lb (94.8 kg)   SpO2 99%   BMI 37.62 kg/m    Subjective:    Patient ID: Nancy Sanchez, female    DOB: Jul 30, 1971, 48 y.o.   MRN: [id number]  HPI: Nancy Sanchez is a 48 y.o. female  Chief Complaint  Patient presents with  . Hot Flashes    hot and cold day and night. got worse during night   MENOPAUSAL SYMPTOMS- has been having hot flashes and night sweats all night for about 2 months, during the day has only happened 5-6x in the last couple of months. It's bad- she had racing heart, dizzy Duration: 2 months Symptom severity: moderate Hot flashes: yes Night sweats: yes Sleep disturbances: yes Vaginal dryness: yes Dyspareunia:yes Decreased libido: no Emotional lability: yes Stress incontinence: yes Previous HRT/pharmacotherapy: no Hysterectomy: no Average interval between menses: monthly Length of menses: 1.5 weeks  Flow: moderate Dysmenorrhea: no Absolute Contraindications to Hormonal Therapy:   Has a history of SVT- has had holter monitor but it was >20 years ago  DEPRESSION Mood status: exacerbated Satisfied with current treatment?: no Symptom severity: moderate  Duration of current treatment : chronic Side effects: no Medication compliance: good compliance Psychotherapy/counseling: no  Previous psychiatric medications: effexor, wellbutrin Depressed mood: yes Anxious mood: yes Anhedonia: no Significant weight loss or gain: no Insomnia: yes hard to fall asleep Fatigue: yes Feelings of worthlessness or guilt: yes Impaired concentration/indecisiveness: yes Suicidal ideations: no Hopelessness: no Crying spells: yes Depression screen [facility] 2/9 01/19/2019 08/14/2018 03/08/2018 12/25/2017 05/01/2017  Decreased Interest 2 1 2 3 1   Down, Depressed, Hopeless 3 1 2 3 1   PHQ - 2 Score 5 2 4 6 2   Altered sleeping 2 1 1 3 1   Tired, decreased energy 2 2 1 3 1   Change in  appetite 0 0 1 1 0  Feeling bad or failure about yourself  0 0 0 1 0  Trouble concentrating 3 3 2 2 1   Moving slowly or fidgety/restless 0 0 0 0 0  Suicidal thoughts 1 0 0 0 0  PHQ-9 Score 13 8 9 16 5   Difficult doing work/chores Very difficult Not difficult at all Somewhat difficult - -     Relevant past medical, surgical, family and social history reviewed and updated as indicated. Interim medical history since our last visit reviewed. Allergies and medications reviewed and updated.  Review of Systems  Constitutional: Positive for diaphoresis and fatigue. Negative for activity change, appetite change, chills, fever and unexpected weight change.  Respiratory: Negative.   Cardiovascular: Negative.   Gastrointestinal: Negative.   Musculoskeletal: Negative.   Skin: Negative.   Neurological: Negative.   Psychiatric/Behavioral: Positive for dysphoric mood and sleep disturbance. Negative for agitation, behavioral problems, confusion, decreased concentration, hallucinations, self-injury and suicidal ideas. The patient is nervous/anxious. The patient is not hyperactive.     Per HPI unless specifically indicated above     Objective:    BP 129/76   Pulse 76   Temp 98.3 F (36.8 C) (Oral)   Ht 5' 2.5" (1.588 m)   Wt 209 lb (94.8 kg)   SpO2 99%   BMI 37.62 kg/m   Wt Readings from Last 3 Encounters:  01/15/19 209 lb (94.8 kg)  09/24/18 207 lb 2 oz (94 kg)  08/14/18 210 lb (95.3 kg)    Physical Exam Vitals  signs and nursing note reviewed.  Constitutional:      General: She is not in acute distress.    Appearance: Normal appearance. She is not ill-appearing, toxic-appearing or diaphoretic.  HENT:     Head: Normocephalic and atraumatic.     Right Ear: External ear normal.     Left Ear: External ear normal.     Nose: Nose normal.     Mouth/Throat:     Mouth: Mucous membranes are moist.     Pharynx: Oropharynx is clear.  Eyes:     General: No scleral icterus.       Right eye:  No discharge.        Left eye: No discharge.     Extraocular Movements: Extraocular movements intact.     Conjunctiva/sclera: Conjunctivae normal.     Pupils: Pupils are equal, round, and reactive to light.  Neck:     Musculoskeletal: Normal range of motion and neck supple.  Cardiovascular:     Rate and Rhythm: Normal rate and regular rhythm.     Pulses: Normal pulses.     Heart sounds: Normal heart sounds. No murmur. No friction rub. No gallop.   Pulmonary:     Effort: Pulmonary effort is normal. No respiratory distress.     Breath sounds: Normal breath sounds. No stridor. No wheezing, rhonchi or rales.  Chest:     Chest wall: No tenderness.  Musculoskeletal: Normal range of motion.  Skin:    General: Skin is warm and dry.     Capillary Refill: Capillary refill takes less than 2 seconds.     Coloration: Skin is not jaundiced or pale.     Findings: No bruising, erythema, lesion or rash.  Neurological:     General: No focal deficit present.     Mental Status: She is alert and oriented to person, place, and time. Mental status is at baseline.  Psychiatric:        Mood and Affect: Mood normal.        Behavior: Behavior normal.        Thought Content: Thought content normal.        Judgment: Judgment normal.     Results for orders placed or performed in visit on 01/15/19  [facility]  Result Value Ref Range   FSH 6.6 mIU/mL  LH  Result Value Ref Range   LH 7.9 mIU/mL  CBC with Differential/Platelet  Result Value Ref Range   WBC 6.4 3.4 - 10.8 x10E3/uL   RBC 4.35 3.77 - 5.28 x10E6/uL   Hemoglobin 10.2 (L) 11.1 - 15.9 g/dL   Hematocrit 73.5 (L) 32.9 - 46.6 %   MCV 76 (L) 79 - 97 fL   MCH 23.4 (L) 26.6 - 33.0 pg   MCHC 30.7 (L) 31.5 - 35.7 g/dL   RDW 92.4 26.8 - 34.1 %   Platelets 370 150 - 450 x10E3/uL   Neutrophils 66 Not Estab. %   Lymphs 21 Not Estab. %   Monocytes 7 Not Estab. %   Eos 4 Not Estab. %   Basos 1 Not Estab. %   Neutrophils Absolute 4.3 1.4 - 7.0 x10E3/uL    Lymphocytes Absolute 1.3 0.7 - 3.1 x10E3/uL   Monocytes Absolute 0.5 0.1 - 0.9 x10E3/uL   EOS (ABSOLUTE) 0.2 0.0 - 0.4 x10E3/uL   Basophils Absolute 0.1 0.0 - 0.2 x10E3/uL   Immature Granulocytes 1 Not Estab. %   Immature Grans (Abs) 0.0 0.0 - 0.1 x10E3/uL  Bayer DCA Hb A1c  Waived  Result Value Ref Range   HB A1C (BAYER DCA - WAIVED) 5.1 <7.0 %  Comprehensive metabolic panel  Result Value Ref Range   Glucose 78 65 - 99 mg/dL   BUN 24 6 - 24 mg/dL   Creatinine, Ser 2.95 0.57 - 1.00 mg/dL   GFR calc non Af Amer 105 >59 mL/min/1.73   GFR calc Af Amer 120 >59 mL/min/1.73   BUN/Creatinine Ratio 35 (H) 9 - 23   Sodium 142 134 - 144 mmol/L   Potassium 4.7 3.5 - 5.2 mmol/L   Chloride 104 96 - 106 mmol/L   CO2 20 20 - 29 mmol/L   Calcium 9.4 8.7 - 10.2 mg/dL   Total Protein 7.0 6.0 - 8.5 g/dL   Albumin 4.6 3.8 - 4.8 g/dL   Globulin, Total 2.4 1.5 - 4.5 g/dL   Albumin/Globulin Ratio 1.9 1.2 - 2.2   Bilirubin Total 0.2 0.0 - 1.2 mg/dL   Alkaline Phosphatase 77 39 - 117 IU/L   AST 14 0 - 40 IU/L   ALT 14 0 - 32 IU/L  Lipid Panel w/o Chol/HDL Ratio  Result Value Ref Range   Cholesterol, Total 207 (H) 100 - 199 mg/dL   Triglycerides 621 0 - 149 mg/dL   HDL 62 >30 mg/dL   VLDL Cholesterol Cal 27 5 - 40 mg/dL   LDL Calculated 865 (H) 0 - 99 mg/dL  TSH  Result Value Ref Range   TSH 0.806 0.450 - 4.500 uIU/mL  VITAMIN D 25 Hydroxy (Vit-D Deficiency, Fractures)  Result Value Ref Range   Vit D, 25-Hydroxy 47.8 30.0 - 100.0 ng/mL  Estradiol  Result Value Ref Range   Estradiol 13.5 pg/mL  Testosterone, free, total([facility])  Result Value Ref Range   Testosterone 3 (L) 8 - 48 ng/dL   Testosterone, Free 1.3 0.0 - 4.2 pg/mL   Sex Hormone Binding 34.3 24.6 - 122.0 nmol/L  Iron and TIBC  Result Value Ref Range   Total Iron Binding Capacity 510 (H) 250 - 450 ug/dL   UIBC 784 (H) 696 - 295 ug/dL   Iron 17 (L) 27 - 284 ug/dL   Iron Saturation 3 (LL) 15 - 55 %  Ferritin  Result  Value Ref Range   Ferritin 15 15 - 150 ng/mL  B12 and Folate Panel  Result Value Ref Range   Vitamin B-12 502 232 - 1,245 pg/mL   Folate 5.2 >3.0 ng/mL      Assessment & Plan:   Problem List Items Addressed This Visit      Cardiovascular and Mediastinum   Paroxysmal supraventricular tachycardia (HCC) - Primary    Unclear if this is what's causing her hot flashes. Has not had holter in 20+ years. We will get her into cardiology and check labs. Await results.       Relevant Orders   Comprehensive metabolic panel (Completed)   TSH (Completed)   Ambulatory referral to Cardiology     Other   Iron deficiency anemia    Rechecking levels today. Await results. Call with any concerns.       Relevant Orders   CBC with Differential/Platelet (Completed)   Iron and TIBC (Completed)   Ferritin (Completed)   B12 and Folate Panel (Completed)   Mild episode of recurrent major depressive disorder (HCC)    Not doing great right now. Will check labs to see if there is organic cause, otherwise consider changing medication. Call with any concerns.  Relevant Medications   busPIRone (BUSPAR) 5 MG tablet   escitalopram (LEXAPRO) 20 MG tablet   Morbid obesity (HCC)    Due to HTN and osteoarthritis. Will continue to work on diet and exercise with goal of losing 1-2lbs a week. Call with any concerns. Continue to monitor.       Relevant Orders   Bayer DCA Hb A1c Waived (Completed)   Comprehensive metabolic panel (Completed)    Other Visit Diagnoses    Hot flashes       ?peri-menopause vs other cause. Will check labs and await results.    Relevant Orders   FSH (Completed)   LH (Completed)   CBC with Differential/Platelet (Completed)   Comprehensive metabolic panel (Completed)   TSH (Completed)   VITAMIN D 25 Hydroxy (Vit-D Deficiency, Fractures) (Completed)   Estradiol (Completed)   Testosterone, free, total([facility]) (Completed)   Screening for cholesterol level       Labs  drawn today. Await results.    Relevant Orders   Lipid Panel w/o Chol/HDL Ratio (Completed)       Follow up plan: Return in about 4 weeks (around 02/12/2019).

## 2019-01-16 LAB — LIPID PANEL W/O CHOL/HDL RATIO
Cholesterol, Total: 207 mg/dL — ABNORMAL HIGH (ref 100–199)
HDL: 62 mg/dL (ref >39)
LDL Calculated: 118 mg/dL — ABNORMAL HIGH (ref 0–99)
Triglycerides: 135 mg/dL (ref 0–149)
VLDL Cholesterol Cal: 27 mg/dL (ref 5–40)

## 2019-01-16 LAB — CBC WITH DIFFERENTIAL/PLATELET
Basophils Absolute: 0.1 10*3/uL (ref 0.0–0.2)
Basos: 1 %
EOS (ABSOLUTE): 0.2 10*3/uL (ref 0.0–0.4)
Eos: 4 %
Hematocrit: 33.2 % — ABNORMAL LOW (ref 34.0–46.6)
Hemoglobin: 10.2 g/dL — ABNORMAL LOW (ref 11.1–15.9)
Immature Grans (Abs): 0 10*3/uL (ref 0.0–0.1)
Immature Granulocytes: 1 %
Lymphocytes Absolute: 1.3 10*3/uL (ref 0.7–3.1)
Lymphs: 21 %
MCH: 23.4 pg — ABNORMAL LOW (ref 26.6–33.0)
MCHC: 30.7 g/dL — ABNORMAL LOW (ref 31.5–35.7)
MCV: 76 fL — ABNORMAL LOW (ref 79–97)
Monocytes Absolute: 0.5 10*3/uL (ref 0.1–0.9)
Monocytes: 7 %
Neutrophils Absolute: 4.3 10*3/uL (ref 1.4–7.0)
Neutrophils: 66 %
Platelets: 370 10*3/uL (ref 150–450)
RBC: 4.35 x10E6/uL (ref 3.77–5.28)
RDW: 14.7 % (ref 11.7–15.4)
WBC: 6.4 10*3/uL (ref 3.4–10.8)

## 2019-01-16 LAB — COMPREHENSIVE METABOLIC PANEL
ALT: 14 IU/L (ref 0–32)
AST: 14 IU/L (ref 0–40)
Albumin/Globulin Ratio: 1.9 (ref 1.2–2.2)
Albumin: 4.6 g/dL (ref 3.8–4.8)
Alkaline Phosphatase: 77 IU/L (ref 39–117)
BUN/Creatinine Ratio: 35 — ABNORMAL HIGH (ref 9–23)
BUN: 24 mg/dL (ref 6–24)
Bilirubin Total: 0.2 mg/dL (ref 0.0–1.2)
CO2: 20 mmol/L (ref 20–29)
Calcium: 9.4 mg/dL (ref 8.7–10.2)
Chloride: 104 mmol/L (ref 96–106)
Creatinine, Ser: 0.68 mg/dL (ref 0.57–1.00)
GFR calc Af Amer: 120 mL/min/{1.73_m2} (ref >59)
GFR calc non Af Amer: 105 mL/min/{1.73_m2} (ref >59)
Globulin, Total: 2.4 g/dL (ref 1.5–4.5)
Glucose: 78 mg/dL (ref 65–99)
Potassium: 4.7 mmol/L (ref 3.5–5.2)
Sodium: 142 mmol/L (ref 134–144)
Total Protein: 7 g/dL (ref 6.0–8.5)

## 2019-01-16 LAB — IRON AND TIBC
Iron Saturation: 3 % — CL (ref 15–55)
Iron: 17 ug/dL — ABNORMAL LOW (ref 27–159)
Total Iron Binding Capacity: 510 ug/dL — ABNORMAL HIGH (ref 250–450)
UIBC: 493 ug/dL — ABNORMAL HIGH (ref 131–425)

## 2019-01-16 LAB — TESTOSTERONE, FREE, TOTAL, SHBG
Sex Hormone Binding: 34.3 nmol/L (ref 24.6–122.0)
Testosterone, Free: 1.3 pg/mL (ref 0.0–4.2)
Testosterone: 3 ng/dL — ABNORMAL LOW (ref 8–48)

## 2019-01-16 LAB — FOLLICLE STIMULATING HORMONE: FSH: 6.6 m[IU]/mL

## 2019-01-16 LAB — TSH: TSH: 0.806 u[IU]/mL (ref 0.450–4.500)

## 2019-01-16 LAB — VITAMIN D 25 HYDROXY (VIT D DEFICIENCY, FRACTURES): Vit D, 25-Hydroxy: 47.8 ng/mL (ref 30.0–100.0)

## 2019-01-16 LAB — LUTEINIZING HORMONE: LH: 7.9 m[IU]/mL

## 2019-01-16 LAB — B12 AND FOLATE PANEL
Folate: 5.2 ng/mL (ref >3.0)
Vitamin B-12: 502 pg/mL (ref 232–1245)

## 2019-01-16 LAB — FERRITIN: Ferritin: 15 ng/mL (ref 15–150)

## 2019-01-16 LAB — ESTRADIOL: Estradiol: 13.5 pg/mL

## 2019-01-19 ENCOUNTER — Encounter: Payer: Self-pay | Admitting: Family Medicine

## 2019-01-19 NOTE — Assessment & Plan Note (Signed)
Unclear if this is what's causing her hot flashes. Has not had holter in 20+ years. We will get her into cardiology and check labs. Await results.

## 2019-01-19 NOTE — Assessment & Plan Note (Signed)
Rechecking levels today. Await results. Call with any concerns.  

## 2019-01-19 NOTE — Assessment & Plan Note (Signed)
Due to HTN and osteoarthritis. Will continue to work on diet and exercise with goal of losing 1-2lbs a week. Call with any concerns. Continue to monitor.

## 2019-01-19 NOTE — Assessment & Plan Note (Signed)
Not doing great right now. Will check labs to see if there is organic cause, otherwise consider changing medication. Call with any concerns.

## 2019-01-21 DIAGNOSIS — N939 Abnormal uterine and vaginal bleeding, unspecified: Secondary | ICD-10-CM | POA: Diagnosis not present

## 2019-01-21 DIAGNOSIS — Z9884 Bariatric surgery status: Secondary | ICD-10-CM | POA: Diagnosis not present

## 2019-01-21 DIAGNOSIS — D509 Iron deficiency anemia, unspecified: Secondary | ICD-10-CM | POA: Diagnosis not present

## 2019-01-22 ENCOUNTER — Other Ambulatory Visit: Payer: Self-pay | Admitting: Family Medicine

## 2019-01-22 NOTE — Telephone Encounter (Signed)
Requested Prescriptions  Pending Prescriptions Disp Refills  . rizatriptan (MAXALT) 10 MG tablet [Pharmacy Med Name: RIZATRIPTAN 10 MG TABLET] 10 tablet 6    Sig: Take 1 tablet (10 mg total) by mouth as needed for migraine. May repeat after 2 hours if not better. No more than 2 pills in 24 hours     Neurology:  Migraine Therapy - Triptan Passed - 01/22/2019 11:31 AM      Passed - Last BP in normal range    BP Readings from Last 1 Encounters:  01/15/19 129/76         Passed - Valid encounter within last 12 months    Recent Outpatient Visits          1 week ago Paroxysmal supraventricular tachycardia (HCC)   [facility], Megan P, DO   4 months ago Dysuria   [facility] [place], [place] T, NP   5 months ago Anxiety   [facility] [place], [place] T, NP   6 months ago Acute cystitis without hematuria   [facility] [place], Megan P, DO   7 months ago Acute pain of right knee   [facility] [place], Adriana M, PA-C           . propranolol ER (INDERAL LA) 80 MG 24 hr capsule [Pharmacy Med Name: PROPRANOLOL ER 80 MG CAPSULE] 30 capsule 0    Sig: Take 1 capsule (80 mg total) by mouth daily.     Cardiovascular:  Beta Blockers Passed - 01/22/2019 11:31 AM      Passed - Last BP in normal range    BP Readings from Last 1 Encounters:  01/15/19 129/76         Passed - Last Heart Rate in normal range    Pulse Readings from Last 1 Encounters:  01/15/19 76         Passed - Valid encounter within last 6 months    Recent Outpatient Visits          1 week ago Paroxysmal supraventricular tachycardia (HCC)   [facility] [place], Megan P, DO   4 months ago Dysuria   [facility] [place], [place] T, NP   5 months ago Anxiety   [facility] [place], [place] T, NP   6 months ago Acute cystitis without hematuria   [facility] [place], Megan P, DO   7 months ago Acute pain of  right knee   [facility] Trey Sailors, [place]

## 2019-02-07 DIAGNOSIS — D509 Iron deficiency anemia, unspecified: Secondary | ICD-10-CM | POA: Diagnosis not present

## 2019-02-10 DIAGNOSIS — D509 Iron deficiency anemia, unspecified: Secondary | ICD-10-CM | POA: Diagnosis not present

## 2019-02-19 ENCOUNTER — Other Ambulatory Visit: Payer: Self-pay | Admitting: Family Medicine

## 2019-02-21 ENCOUNTER — Encounter: Payer: Self-pay | Admitting: Family Medicine

## 2019-02-26 DIAGNOSIS — I471 Supraventricular tachycardia: Secondary | ICD-10-CM | POA: Diagnosis not present

## 2019-02-26 DIAGNOSIS — I10 Essential (primary) hypertension: Secondary | ICD-10-CM | POA: Diagnosis not present

## 2019-03-12 DIAGNOSIS — M5136 Other intervertebral disc degeneration, lumbar region: Secondary | ICD-10-CM | POA: Diagnosis not present

## 2019-03-12 DIAGNOSIS — M1612 Unilateral primary osteoarthritis, left hip: Secondary | ICD-10-CM | POA: Diagnosis not present

## 2019-03-12 DIAGNOSIS — M9903 Segmental and somatic dysfunction of lumbar region: Secondary | ICD-10-CM | POA: Diagnosis not present

## 2019-03-12 DIAGNOSIS — M9906 Segmental and somatic dysfunction of lower extremity: Secondary | ICD-10-CM | POA: Diagnosis not present

## 2019-03-13 ENCOUNTER — Encounter: Payer: Self-pay | Admitting: Family Medicine

## 2019-03-14 ENCOUNTER — Other Ambulatory Visit: Payer: Self-pay

## 2019-03-14 ENCOUNTER — Encounter: Payer: Self-pay | Admitting: Family Medicine

## 2019-03-14 ENCOUNTER — Ambulatory Visit (INDEPENDENT_AMBULATORY_CARE_PROVIDER_SITE_OTHER): Payer: BLUE CROSS/BLUE SHIELD | Admitting: Family Medicine

## 2019-03-14 VITALS — BP 120/78 | HR 75 | Temp 98.3°F | Ht 62.0 in | Wt 212.0 lb

## 2019-03-14 DIAGNOSIS — R7989 Other specified abnormal findings of blood chemistry: Secondary | ICD-10-CM

## 2019-03-14 DIAGNOSIS — J029 Acute pharyngitis, unspecified: Secondary | ICD-10-CM | POA: Diagnosis not present

## 2019-03-14 DIAGNOSIS — J301 Allergic rhinitis due to pollen: Secondary | ICD-10-CM | POA: Diagnosis not present

## 2019-03-14 DIAGNOSIS — J020 Streptococcal pharyngitis: Secondary | ICD-10-CM

## 2019-03-14 LAB — RAPID STREP SCREEN (MED CTR MEBANE ONLY): Strep Gp A Ag, IA W/Reflex: POSITIVE — AB

## 2019-03-14 MED ORDER — MONTELUKAST SODIUM 10 MG PO TABS: 10.0000 mg | ORAL_TABLET | Freq: Every day | ORAL | 1 refills | Status: DC

## 2019-03-14 MED ORDER — AMOXICILLIN 875 MG PO TABS: 875.0000 mg | ORAL_TABLET | Freq: Two times a day (BID) | ORAL | 0 refills | Status: DC

## 2019-03-14 NOTE — Assessment & Plan Note (Signed)
Refill singulair, continue other allergy regimen as well

## 2019-03-14 NOTE — Progress Notes (Signed)
BP 120/78   Pulse 75   Temp 98.3 F (36.8 C) (Oral)   Ht 5\' 2"  (1.575 m)   Wt 212 lb (96.2 kg)   SpO2 98%   BMI 38.78 kg/m    Subjective:    Patient ID: Nancy Sanchez, female    DOB: 08/03/1971, 48 y.o.   MRN: [id number]  HPI: Nancy Sanchez is a 48 y.o. female  Chief Complaint  Patient presents with  . Sore Throat    since last night  . Sinusitis  . Medication Refill    montelukas   Patient presenting today for sore, swollen throat. Typically has bad postnasal drainage but since yesterday things feel worse than usual. No fevers, chills, sweats, nausea and vomiting, sick contacts. Not trying anything for sxs. Was previously on singulair for better allergy control and thinking she needs to go back on.   Relevant past medical, surgical, family and social history reviewed and updated as indicated. Interim medical history since our last visit reviewed. Allergies and medications reviewed and updated.  Review of Systems  Per HPI unless specifically indicated above     Objective:    BP 120/78   Pulse 75   Temp 98.3 F (36.8 C) (Oral)   Ht 5\' 2"  (1.575 m)   Wt 212 lb (96.2 kg)   SpO2 98%   BMI 38.78 kg/m   Wt Readings from Last 3 Encounters:  03/14/19 212 lb (96.2 kg)  01/15/19 209 lb (94.8 kg)  09/24/18 207 lb 2 oz (94 kg)    Physical Exam Vitals signs and nursing note reviewed.  Constitutional:      Appearance: Normal appearance. She is not ill-appearing.  HENT:     Head: Atraumatic.     Right Ear: Tympanic membrane normal.     Left Ear: Tympanic membrane normal.     Nose: Nose normal.     Mouth/Throat:     Pharynx: Posterior oropharyngeal erythema present. No oropharyngeal exudate.  Eyes:     Extraocular Movements: Extraocular movements intact.     Conjunctiva/sclera: Conjunctivae normal.  Neck:     Musculoskeletal: Normal range of motion and neck supple.  Cardiovascular:     Rate and Rhythm: Normal rate and regular rhythm.     Heart  sounds: Normal heart sounds.  Pulmonary:     Effort: Pulmonary effort is normal.     Breath sounds: Normal breath sounds.  Musculoskeletal: Normal range of motion.  Skin:    General: Skin is warm and dry.  Neurological:     Mental Status: She is alert and oriented to person, place, and time.  Psychiatric:        Mood and Affect: Mood normal.        Thought Content: Thought content normal.        Judgment: Judgment normal.     Results for orders placed or performed in visit on 01/15/19  [facility]  Result Value Ref Range   FSH 6.6 mIU/mL  LH  Result Value Ref Range   LH 7.9 mIU/mL  CBC with Differential/Platelet  Result Value Ref Range   WBC 6.4 3.4 - 10.8 x10E3/uL   RBC 4.35 3.77 - 5.28 x10E6/uL   Hemoglobin 10.2 (L) 11.1 - 15.9 g/dL   Hematocrit 40.933.2 (L) 81.134.0 - 46.6 %   MCV 76 (L) 79 - 97 fL   MCH 23.4 (L) 26.6 - 33.0 pg   MCHC 30.7 (L) 31.5 - 35.7 g/dL   RDW 91.414.7 78.211.7 -  15.4 %   Platelets 370 150 - 450 x10E3/uL   Neutrophils 66 Not Estab. %   Lymphs 21 Not Estab. %   Monocytes 7 Not Estab. %   Eos 4 Not Estab. %   Basos 1 Not Estab. %   Neutrophils Absolute 4.3 1.4 - 7.0 x10E3/uL   Lymphocytes Absolute 1.3 0.7 - 3.1 x10E3/uL   Monocytes Absolute 0.5 0.1 - 0.9 x10E3/uL   EOS (ABSOLUTE) 0.2 0.0 - 0.4 x10E3/uL   Basophils Absolute 0.1 0.0 - 0.2 x10E3/uL   Immature Granulocytes 1 Not Estab. %   Immature Grans (Abs) 0.0 0.0 - 0.1 x10E3/uL  Bayer DCA Hb A1c Waived  Result Value Ref Range   HB A1C (BAYER DCA - WAIVED) 5.1 <7.0 %  Comprehensive metabolic panel  Result Value Ref Range   Glucose 78 65 - 99 mg/dL   BUN 24 6 - 24 mg/dL   Creatinine, Ser 9.62 0.57 - 1.00 mg/dL   GFR calc non Af Amer 105 >59 mL/min/1.73   GFR calc Af Amer 120 >59 mL/min/1.73   BUN/Creatinine Ratio 35 (H) 9 - 23   Sodium 142 134 - 144 mmol/L   Potassium 4.7 3.5 - 5.2 mmol/L   Chloride 104 96 - 106 mmol/L   CO2 20 20 - 29 mmol/L   Calcium 9.4 8.7 - 10.2 mg/dL   Total Protein 7.0 6.0 - 8.5  g/dL   Albumin 4.6 3.8 - 4.8 g/dL   Globulin, Total 2.4 1.5 - 4.5 g/dL   Albumin/Globulin Ratio 1.9 1.2 - 2.2   Bilirubin Total 0.2 0.0 - 1.2 mg/dL   Alkaline Phosphatase 77 39 - 117 IU/L   AST 14 0 - 40 IU/L   ALT 14 0 - 32 IU/L  Lipid Panel w/o Chol/HDL Ratio  Result Value Ref Range   Cholesterol, Total 207 (H) 100 - 199 mg/dL   Triglycerides 836 0 - 149 mg/dL   HDL 62 >62 mg/dL   VLDL Cholesterol Cal 27 5 - 40 mg/dL   LDL Calculated 947 (H) 0 - 99 mg/dL  TSH  Result Value Ref Range   TSH 0.806 0.450 - 4.500 uIU/mL  VITAMIN D 25 Hydroxy (Vit-D Deficiency, Fractures)  Result Value Ref Range   Vit D, 25-Hydroxy 47.8 30.0 - 100.0 ng/mL  Estradiol  Result Value Ref Range   Estradiol 13.5 pg/mL  Testosterone, free, total([facility])  Result Value Ref Range   Testosterone 3 (L) 8 - 48 ng/dL   Testosterone, Free 1.3 0.0 - 4.2 pg/mL   Sex Hormone Binding 34.3 24.6 - 122.0 nmol/L  Iron and TIBC  Result Value Ref Range   Total Iron Binding Capacity 510 (H) 250 - 450 ug/dL   UIBC 654 (H) 650 - 354 ug/dL   Iron 17 (L) 27 - 656 ug/dL   Iron Saturation 3 (LL) 15 - 55 %  Ferritin  Result Value Ref Range   Ferritin 15 15 - 150 ng/mL  B12 and Folate Panel  Result Value Ref Range   Vitamin B-12 502 232 - 1,245 pg/mL   Folate 5.2 >3.0 ng/mL      Assessment & Plan:   Problem List Items Addressed This Visit      Respiratory   Seasonal allergic rhinitis    Refill singulair, continue other allergy regimen as well       Other Visit Diagnoses    Strep pharyngitis    -  Primary   Rapid strep positive. Tx with amoxil, work note  given for today. OTC pain relievers prn   Relevant Orders   Rapid Strep Screen ([facility] ONLY)   Abnormal CBC       Relevant Orders   CBC with Differential/Platelet       Follow up plan: Return if symptoms worsen or fail to improve.

## 2019-03-18 DIAGNOSIS — M9903 Segmental and somatic dysfunction of lumbar region: Secondary | ICD-10-CM | POA: Diagnosis not present

## 2019-03-18 DIAGNOSIS — M9906 Segmental and somatic dysfunction of lower extremity: Secondary | ICD-10-CM | POA: Diagnosis not present

## 2019-03-18 DIAGNOSIS — M1612 Unilateral primary osteoarthritis, left hip: Secondary | ICD-10-CM | POA: Diagnosis not present

## 2019-03-18 DIAGNOSIS — M5136 Other intervertebral disc degeneration, lumbar region: Secondary | ICD-10-CM | POA: Diagnosis not present

## 2019-03-19 DIAGNOSIS — H5203 Hypermetropia, bilateral: Secondary | ICD-10-CM | POA: Diagnosis not present

## 2019-03-19 DIAGNOSIS — H524 Presbyopia: Secondary | ICD-10-CM | POA: Diagnosis not present

## 2019-03-26 DIAGNOSIS — M9906 Segmental and somatic dysfunction of lower extremity: Secondary | ICD-10-CM | POA: Diagnosis not present

## 2019-03-26 DIAGNOSIS — M5136 Other intervertebral disc degeneration, lumbar region: Secondary | ICD-10-CM | POA: Diagnosis not present

## 2019-03-26 DIAGNOSIS — M9903 Segmental and somatic dysfunction of lumbar region: Secondary | ICD-10-CM | POA: Diagnosis not present

## 2019-03-26 DIAGNOSIS — M1612 Unilateral primary osteoarthritis, left hip: Secondary | ICD-10-CM | POA: Diagnosis not present

## 2019-03-29 DIAGNOSIS — W228XXA Striking against or struck by other objects, initial encounter: Secondary | ICD-10-CM | POA: Diagnosis not present

## 2019-03-29 DIAGNOSIS — S83511A Sprain of anterior cruciate ligament of right knee, initial encounter: Secondary | ICD-10-CM | POA: Diagnosis not present

## 2019-03-29 DIAGNOSIS — M25561 Pain in right knee: Secondary | ICD-10-CM | POA: Diagnosis not present

## 2019-03-29 DIAGNOSIS — M25361 Other instability, right knee: Secondary | ICD-10-CM | POA: Diagnosis not present

## 2019-04-02 DIAGNOSIS — M9906 Segmental and somatic dysfunction of lower extremity: Secondary | ICD-10-CM | POA: Diagnosis not present

## 2019-04-02 DIAGNOSIS — M9903 Segmental and somatic dysfunction of lumbar region: Secondary | ICD-10-CM | POA: Diagnosis not present

## 2019-04-02 DIAGNOSIS — M1612 Unilateral primary osteoarthritis, left hip: Secondary | ICD-10-CM | POA: Diagnosis not present

## 2019-04-02 DIAGNOSIS — M5136 Other intervertebral disc degeneration, lumbar region: Secondary | ICD-10-CM | POA: Diagnosis not present

## 2019-04-04 DIAGNOSIS — M25361 Other instability, right knee: Secondary | ICD-10-CM | POA: Diagnosis not present

## 2019-04-07 DIAGNOSIS — I471 Supraventricular tachycardia: Secondary | ICD-10-CM | POA: Diagnosis not present

## 2019-04-07 DIAGNOSIS — M25361 Other instability, right knee: Secondary | ICD-10-CM | POA: Insufficient documentation

## 2019-04-08 DIAGNOSIS — M25361 Other instability, right knee: Secondary | ICD-10-CM | POA: Diagnosis not present

## 2019-04-08 DIAGNOSIS — M23203 Derangement of unspecified medial meniscus due to old tear or injury, right knee: Secondary | ICD-10-CM | POA: Insufficient documentation

## 2019-04-08 DIAGNOSIS — M25561 Pain in right knee: Secondary | ICD-10-CM | POA: Diagnosis not present

## 2019-04-09 DIAGNOSIS — M1612 Unilateral primary osteoarthritis, left hip: Secondary | ICD-10-CM | POA: Diagnosis not present

## 2019-04-09 DIAGNOSIS — M9906 Segmental and somatic dysfunction of lower extremity: Secondary | ICD-10-CM | POA: Diagnosis not present

## 2019-04-09 DIAGNOSIS — M9903 Segmental and somatic dysfunction of lumbar region: Secondary | ICD-10-CM | POA: Diagnosis not present

## 2019-04-09 DIAGNOSIS — M5136 Other intervertebral disc degeneration, lumbar region: Secondary | ICD-10-CM | POA: Diagnosis not present

## 2019-04-15 ENCOUNTER — Encounter: Payer: BLUE CROSS/BLUE SHIELD | Admitting: Family Medicine

## 2019-04-16 DIAGNOSIS — M5136 Other intervertebral disc degeneration, lumbar region: Secondary | ICD-10-CM | POA: Diagnosis not present

## 2019-04-16 DIAGNOSIS — M9906 Segmental and somatic dysfunction of lower extremity: Secondary | ICD-10-CM | POA: Diagnosis not present

## 2019-04-16 DIAGNOSIS — M1612 Unilateral primary osteoarthritis, left hip: Secondary | ICD-10-CM | POA: Diagnosis not present

## 2019-04-16 DIAGNOSIS — M9903 Segmental and somatic dysfunction of lumbar region: Secondary | ICD-10-CM | POA: Diagnosis not present

## 2019-04-17 DIAGNOSIS — R002 Palpitations: Secondary | ICD-10-CM | POA: Insufficient documentation

## 2019-04-17 DIAGNOSIS — Z8679 Personal history of other diseases of the circulatory system: Secondary | ICD-10-CM | POA: Insufficient documentation

## 2019-04-23 DIAGNOSIS — M5136 Other intervertebral disc degeneration, lumbar region: Secondary | ICD-10-CM | POA: Diagnosis not present

## 2019-04-23 DIAGNOSIS — M9903 Segmental and somatic dysfunction of lumbar region: Secondary | ICD-10-CM | POA: Diagnosis not present

## 2019-04-23 DIAGNOSIS — M1612 Unilateral primary osteoarthritis, left hip: Secondary | ICD-10-CM | POA: Diagnosis not present

## 2019-04-23 DIAGNOSIS — M9906 Segmental and somatic dysfunction of lower extremity: Secondary | ICD-10-CM | POA: Diagnosis not present

## 2019-04-29 ENCOUNTER — Encounter: Payer: Self-pay | Admitting: Family Medicine

## 2019-04-29 ENCOUNTER — Other Ambulatory Visit: Payer: Self-pay

## 2019-04-29 ENCOUNTER — Ambulatory Visit (INDEPENDENT_AMBULATORY_CARE_PROVIDER_SITE_OTHER): Payer: BC Managed Care – PPO | Admitting: Family Medicine

## 2019-04-29 VITALS — BP 126/68 | HR 60 | Temp 98.6°F | Ht 62.0 in | Wt 214.0 lb

## 2019-04-29 DIAGNOSIS — N760 Acute vaginitis: Secondary | ICD-10-CM | POA: Diagnosis not present

## 2019-04-29 DIAGNOSIS — R300 Dysuria: Secondary | ICD-10-CM | POA: Diagnosis not present

## 2019-04-29 DIAGNOSIS — N898 Other specified noninflammatory disorders of vagina: Secondary | ICD-10-CM | POA: Diagnosis not present

## 2019-04-29 DIAGNOSIS — B9689 Other specified bacterial agents as the cause of diseases classified elsewhere: Secondary | ICD-10-CM

## 2019-04-29 LAB — UA/M W/RFLX CULTURE, ROUTINE
Bilirubin, UA: NEGATIVE
Glucose, UA: NEGATIVE
Ketones, UA: NEGATIVE
Leukocytes,UA: NEGATIVE
Nitrite, UA: NEGATIVE
Protein,UA: NEGATIVE
Specific Gravity, UA: 1.02 (ref 1.005–1.030)
Urobilinogen, Ur: 0.2 mg/dL (ref 0.2–1.0)
pH, UA: 6.5 (ref 5.0–7.5)

## 2019-04-29 LAB — WET PREP FOR TRICH, YEAST, CLUE
Clue Cell Exam: POSITIVE — AB
Trichomonas Exam: NEGATIVE
Yeast Exam: NEGATIVE

## 2019-04-29 LAB — MICROSCOPIC EXAMINATION: Bacteria, UA: NONE SEEN

## 2019-04-29 MED ORDER — CLINDAMYCIN HCL 150 MG PO CAPS: 150.0000 mg | ORAL_CAPSULE | Freq: Two times a day (BID) | ORAL | 0 refills | Status: DC

## 2019-04-29 NOTE — Progress Notes (Signed)
BP 126/68   Pulse 60   Temp 98.6 F (37 C) (Oral)   Ht 5\' 2"  (1.575 m)   Wt 214 lb (97.1 kg)   SpO2 98%   BMI 39.14 kg/m    Subjective:    Patient ID: Nancy Sanchez, female    DOB: [birth year], 48 y.o.   MRN: [id number]  HPI: Nancy KinnierKimberly Renee Sanchez is a 48 y.o. female  Chief Complaint  Patient presents with  . Vaginal Itching    irritation inside of vagina per patient. x about 2-3 weeks ago  . Abdominal Pain    lower   URINARY SYMPTOMS Duration: 2 weeks Dysuria: no Urinary frequency: yes Urgency: no Small volume voids: no Symptom severity: mild Urinary incontinence: no Foul odor: no Hematuria: yes Abdominal pain: no Back pain: no Suprapubic pain/pressure: yes Flank pain: no Fever:  no Vomiting: no Relief with cranberry juice: no Relief with pyridium: no Status: stable Previous urinary tract infection: yes Recurrent urinary tract infection: no Sexual activity: practicing safe sex History of sexually transmitted disease: no Vaginal discharge: yes Treatments attempted: increasing fluids   VAGINAL DISCHARGE Duration: 2 weeks Discharge description: white  Pruritus: yes Dysuria: no Malodorous: no Urinary frequency: yes Fevers: no Abdominal pain: no  Sexual activity: practicing safe sex History of sexually transmitted diseases: no Recent antibiotic use: no Context: stable  Treatments attempted: increasing fluids  Relevant past medical, surgical, family and social history reviewed and updated as indicated. Interim medical history since our last visit reviewed. Allergies and medications reviewed and updated.  Review of Systems  Constitutional: Negative.   Respiratory: Negative.   Cardiovascular: Negative.   Gastrointestinal: Positive for abdominal pain. Negative for abdominal distention, anal bleeding, blood in stool, constipation, diarrhea, nausea, rectal pain and vomiting.  Genitourinary: Positive for frequency, hematuria, urgency and vaginal  discharge. Negative for decreased urine volume, difficulty urinating, dyspareunia, dysuria, enuresis, flank pain, genital sores, menstrual problem, pelvic pain, vaginal bleeding and vaginal pain.  Musculoskeletal: Negative.   Skin: Negative.   Psychiatric/Behavioral: Negative for agitation, behavioral problems, confusion, decreased concentration, dysphoric mood, hallucinations, self-injury, sleep disturbance and suicidal ideas. The patient is nervous/anxious. The patient is not hyperactive.     Per HPI unless specifically indicated above     Objective:    BP 126/68   Pulse 60   Temp 98.6 F (37 C) (Oral)   Ht 5\' 2"  (1.575 m)   Wt 214 lb (97.1 kg)   SpO2 98%   BMI 39.14 kg/m   Wt Readings from Last 3 Encounters:  04/29/19 214 lb (97.1 kg)  03/14/19 212 lb (96.2 kg)  01/15/19 209 lb (94.8 kg)    Physical Exam Vitals signs and nursing note reviewed.  Constitutional:      General: She is not in acute distress.    Appearance: Normal appearance. She is not ill-appearing, toxic-appearing or diaphoretic.  HENT:     Head: Normocephalic and atraumatic.     Right Ear: External ear normal.     Left Ear: External ear normal.     Nose: Nose normal.     Mouth/Throat:     Mouth: Mucous membranes are moist.     Pharynx: Oropharynx is clear.  Eyes:     General: No scleral icterus.       Right eye: No discharge.        Left eye: No discharge.     Extraocular Movements: Extraocular movements intact.     Conjunctiva/sclera: Conjunctivae normal.  Pupils: Pupils are equal, round, and reactive to light.  Neck:     Musculoskeletal: Normal range of motion and neck supple.  Cardiovascular:     Rate and Rhythm: Normal rate and regular rhythm.     Pulses: Normal pulses.     Heart sounds: Normal heart sounds. No murmur. No friction rub. No gallop.   Pulmonary:     Effort: Pulmonary effort is normal. No respiratory distress.     Breath sounds: Normal breath sounds. No stridor. No wheezing,  rhonchi or rales.  Chest:     Chest wall: No tenderness.  Musculoskeletal: Normal range of motion.  Skin:    General: Skin is warm and dry.     Capillary Refill: Capillary refill takes less than 2 seconds.     Coloration: Skin is not cyanotic, jaundiced, mottled or pale.     Findings: No bruising, erythema, lesion or rash.  Neurological:     General: No focal deficit present.     Mental Status: She is alert and oriented to person, place, and time. Mental status is at baseline.  Psychiatric:        Mood and Affect: Mood normal.        Behavior: Behavior normal.        Thought Content: Thought content normal.        Judgment: Judgment normal.     Results for orders placed or performed in visit on 03/14/19  Rapid Strep Screen ([facility] ONLY)   Specimen: Other   OTHER  Result Value Ref Range   Strep Gp A Ag, IA W/Reflex Positive (A) Negative      Assessment & Plan:   Problem List Items Addressed This Visit      Other   Dysuria    Negative UA- likely due to her BV. Will treat. Call with any concerns.      Relevant Orders   UA/M w/rflx Culture, Routine    Other Visit Diagnoses    BV (bacterial vaginosis)    -  Primary   Will treat with flagyl. Call with any concerns. Continue to monitor. Call with any concerns.    Relevant Medications   clindamycin (CLEOCIN) 150 MG capsule   Vaginal irritation       + Clue cells.  +BV. Will treat. Call with any concerns.    Relevant Orders   WET PREP FOR [place], YEAST, CLUE       Follow up plan: Return ASAP Physical/follow up.

## 2019-04-29 NOTE — Assessment & Plan Note (Signed)
Negative UA- likely due to her BV. Will treat. Call with any concerns.

## 2019-04-30 DIAGNOSIS — M9903 Segmental and somatic dysfunction of lumbar region: Secondary | ICD-10-CM | POA: Diagnosis not present

## 2019-04-30 DIAGNOSIS — M9906 Segmental and somatic dysfunction of lower extremity: Secondary | ICD-10-CM | POA: Diagnosis not present

## 2019-04-30 DIAGNOSIS — M5136 Other intervertebral disc degeneration, lumbar region: Secondary | ICD-10-CM | POA: Diagnosis not present

## 2019-04-30 DIAGNOSIS — M1612 Unilateral primary osteoarthritis, left hip: Secondary | ICD-10-CM | POA: Diagnosis not present

## 2019-05-04 DIAGNOSIS — M7541 Impingement syndrome of right shoulder: Secondary | ICD-10-CM | POA: Insufficient documentation

## 2019-05-14 DIAGNOSIS — M1612 Unilateral primary osteoarthritis, left hip: Secondary | ICD-10-CM | POA: Diagnosis not present

## 2019-05-14 DIAGNOSIS — M9903 Segmental and somatic dysfunction of lumbar region: Secondary | ICD-10-CM | POA: Diagnosis not present

## 2019-05-14 DIAGNOSIS — M5136 Other intervertebral disc degeneration, lumbar region: Secondary | ICD-10-CM | POA: Diagnosis not present

## 2019-05-14 DIAGNOSIS — M9906 Segmental and somatic dysfunction of lower extremity: Secondary | ICD-10-CM | POA: Diagnosis not present

## 2019-05-17 DIAGNOSIS — M25511 Pain in right shoulder: Secondary | ICD-10-CM | POA: Diagnosis not present

## 2019-05-17 DIAGNOSIS — G8929 Other chronic pain: Secondary | ICD-10-CM | POA: Diagnosis not present

## 2019-05-21 DIAGNOSIS — M9906 Segmental and somatic dysfunction of lower extremity: Secondary | ICD-10-CM | POA: Diagnosis not present

## 2019-05-21 DIAGNOSIS — M5136 Other intervertebral disc degeneration, lumbar region: Secondary | ICD-10-CM | POA: Diagnosis not present

## 2019-05-21 DIAGNOSIS — M9903 Segmental and somatic dysfunction of lumbar region: Secondary | ICD-10-CM | POA: Diagnosis not present

## 2019-05-21 DIAGNOSIS — M1612 Unilateral primary osteoarthritis, left hip: Secondary | ICD-10-CM | POA: Diagnosis not present

## 2019-05-27 DIAGNOSIS — D509 Iron deficiency anemia, unspecified: Secondary | ICD-10-CM | POA: Diagnosis not present

## 2019-05-28 ENCOUNTER — Encounter: Payer: Self-pay | Admitting: Family Medicine

## 2019-05-28 ENCOUNTER — Other Ambulatory Visit: Payer: Self-pay

## 2019-05-28 ENCOUNTER — Ambulatory Visit (INDEPENDENT_AMBULATORY_CARE_PROVIDER_SITE_OTHER): Payer: BC Managed Care – PPO | Admitting: Family Medicine

## 2019-05-28 VITALS — BP 125/69 | HR 83 | Temp 101.1°F | Ht 62.0 in | Wt 212.0 lb

## 2019-05-28 DIAGNOSIS — L03031 Cellulitis of right toe: Secondary | ICD-10-CM

## 2019-05-28 DIAGNOSIS — R509 Fever, unspecified: Secondary | ICD-10-CM | POA: Diagnosis not present

## 2019-05-28 DIAGNOSIS — Z20822 Contact with and (suspected) exposure to covid-19: Secondary | ICD-10-CM

## 2019-05-28 DIAGNOSIS — T148XXA Other injury of unspecified body region, initial encounter: Secondary | ICD-10-CM

## 2019-05-28 LAB — CBC WITH DIFFERENTIAL/PLATELET
Hematocrit: 37.8 % (ref 34.0–46.6)
Hemoglobin: 13.1 g/dL (ref 11.1–15.9)
Lymphocytes Absolute: 0.6 10*3/uL — ABNORMAL LOW (ref 0.7–3.1)
Lymphs: 11 %
MCH: 32.6 pg (ref 26.6–33.0)
MCHC: 34.7 g/dL (ref 31.5–35.7)
MCV: 94 fL (ref 79–97)
MID (Absolute): 0.7 10*3/uL (ref 0.1–1.6)
MID: 13 %
Neutrophils Absolute: 4.2 10*3/uL (ref 1.4–7.0)
Neutrophils: 76 %
Platelets: 173 10*3/uL (ref 150–450)
RBC: 4.02 x10E6/uL (ref 3.77–5.28)
RDW: 13 % (ref 11.7–15.4)
WBC: 5.5 10*3/uL (ref 3.4–10.8)

## 2019-05-28 MED ORDER — DOXYCYCLINE HYCLATE 100 MG PO TABS: 100.0000 mg | ORAL_TABLET | Freq: Two times a day (BID) | ORAL | 0 refills | Status: DC

## 2019-05-28 NOTE — Progress Notes (Signed)
BP 125/69 (BP Location: Left Arm, Patient Position: Sitting, Cuff Size: Normal)   Pulse 83   Temp (!) 101.1 F (38.4 C) (Oral)   Ht 5\' 2"  (1.575 m)   Wt 212 lb (96.2 kg)   SpO2 96%   BMI 38.78 kg/m    Subjective:    Patient ID: Nancy Sanchez, female    DOB: 1971/08/26, 48 y.o.   MRN: [id number]  HPI: Nancy KinnierKimberly Renee Muska is a 48 y.o. female  Chief Complaint  Patient presents with  . Fever    99.5-101. Doesn't know if it's from dog scatches or ingrown toenail.    Presenting today with Cold chills all morning which is very out of the ordinary for her. Noticed that she had a fever about an hour ago, first was getting temps in the high 99s and now it's up to 101. Feels very achy, but states she always aches in her joints so hard to tell if this is new. Does note that she got a large dog scratch on left forearm while puppy sitting this weekend that she's been trying to keep clean and keeping liquid bandaid on it, and she also has an infected right great toe from an ingrown nail that she's been soaking in epsom salt and keeping covered in triple antibiotic ointment. Both areas are sore, but not draining or having worsening site redness, heat, swelling. No known sick contacts, sore throat, cough, CP, SOB, N/V/D. Not taking anything OTC for sxs.   Relevant past medical, surgical, family and social history reviewed and updated as indicated. Interim medical history since our last visit reviewed. Allergies and medications reviewed and updated.  Review of Systems  Per HPI unless specifically indicated above     Objective:    BP 125/69 (BP Location: Left Arm, Patient Position: Sitting, Cuff Size: Normal)   Pulse 83   Temp (!) 101.1 F (38.4 C) (Oral)   Ht 5\' 2"  (1.575 m)   Wt 212 lb (96.2 kg)   SpO2 96%   BMI 38.78 kg/m   Wt Readings from Last 3 Encounters:  05/28/19 212 lb (96.2 kg)  04/29/19 214 lb (97.1 kg)  03/14/19 212 lb (96.2 kg)    Physical Exam Vitals signs and  nursing note reviewed.  Constitutional:      Appearance: Normal appearance. She is not ill-appearing.  HENT:     Head: Atraumatic.  Eyes:     Extraocular Movements: Extraocular movements intact.     Conjunctiva/sclera: Conjunctivae normal.  Neck:     Musculoskeletal: Normal range of motion and neck supple.  Cardiovascular:     Rate and Rhythm: Normal rate and regular rhythm.     Heart sounds: Normal heart sounds.  Pulmonary:     Effort: Pulmonary effort is normal.     Breath sounds: Normal breath sounds.  Musculoskeletal: Normal range of motion.  Skin:    General: Skin is warm and dry.     Comments: Multiple abrasions to distal left forearm without obvious infection Right great toe paronychia with some surrounding erythema, ttp in area  Neurological:     Mental Status: She is alert and oriented to person, place, and time.  Psychiatric:        Mood and Affect: Mood normal.        Thought Content: Thought content normal.        Judgment: Judgment normal.    Results for orders placed or performed in visit on 04/29/19  WET PREP FOR  [place], YEAST, CLUE   Specimen: Vaginal; Sterile Swab   STERILE SWAB  Result Value Ref Range   Trichomonas Exam Negative Negative   Yeast Exam Negative Negative   Clue Cell Exam Positive (A) Negative  Microscopic Examination   URINE  Result Value Ref Range   WBC, UA 0-5 0 - 5 /hpf   RBC 0-2 0 - 2 /hpf   Epithelial Cells (non renal) 0-10 0 - 10 /hpf   Bacteria, UA None seen None seen/Few  UA/M w/rflx Culture, Routine   Specimen: Urine   URINE  Result Value Ref Range   Specific Gravity, UA 1.020 1.005 - 1.030   pH, UA 6.5 5.0 - 7.5   Color, UA Yellow Yellow   Appearance Ur Hazy (A) Clear   Leukocytes,UA Negative Negative   Protein,UA Negative Negative/Trace   Glucose, UA Negative Negative   Ketones, UA Negative Negative   RBC, UA 2+ (A) Negative   Bilirubin, UA Negative Negative   Urobilinogen, Ur 0.2 0.2 - 1.0 mg/dL   Nitrite, UA  Negative Negative   Microscopic Examination See below:       Assessment & Plan:   Problem List Items Addressed This Visit    None    Visit Diagnoses    Fever, unspecified fever cause    -  Primary   Unclear if from skin infection or viral illness. Refer for COVID 19 testing, quarantine until results back, and start doxy for skin infections   Relevant Orders   Novel Coronavirus, NAA ([facility])   CBC With Differential/Platelet   Paronychia of toe, right       Start doxycycline, continue home wound care, f/u next week with Podiatrist for further management. Will obtain CBC given fever and acute infection   Abrasion       From dog scratch, start doxycycline given hx of severe MRSA infections and continue good home wound care. F/u if sxs worsening or not improving       Follow up plan: Return if symptoms worsen or fail to improve.

## 2019-05-29 ENCOUNTER — Encounter: Payer: BC Managed Care – PPO | Admitting: Family Medicine

## 2019-05-29 DIAGNOSIS — I10 Essential (primary) hypertension: Secondary | ICD-10-CM | POA: Diagnosis not present

## 2019-05-29 DIAGNOSIS — E871 Hypo-osmolality and hyponatremia: Secondary | ICD-10-CM | POA: Diagnosis not present

## 2019-05-29 DIAGNOSIS — N390 Urinary tract infection, site not specified: Secondary | ICD-10-CM | POA: Diagnosis not present

## 2019-05-29 DIAGNOSIS — R51 Headache: Secondary | ICD-10-CM | POA: Diagnosis not present

## 2019-05-29 DIAGNOSIS — I498 Other specified cardiac arrhythmias: Secondary | ICD-10-CM | POA: Diagnosis not present

## 2019-05-29 DIAGNOSIS — Z87891 Personal history of nicotine dependence: Secondary | ICD-10-CM | POA: Diagnosis not present

## 2019-05-29 DIAGNOSIS — E878 Other disorders of electrolyte and fluid balance, not elsewhere classified: Secondary | ICD-10-CM | POA: Diagnosis not present

## 2019-05-29 DIAGNOSIS — Z9189 Other specified personal risk factors, not elsewhere classified: Secondary | ICD-10-CM | POA: Diagnosis not present

## 2019-05-29 DIAGNOSIS — R5383 Other fatigue: Secondary | ICD-10-CM | POA: Diagnosis not present

## 2019-05-29 DIAGNOSIS — N760 Acute vaginitis: Secondary | ICD-10-CM | POA: Diagnosis not present

## 2019-05-29 DIAGNOSIS — R000 Tachycardia, unspecified: Secondary | ICD-10-CM | POA: Diagnosis not present

## 2019-05-29 DIAGNOSIS — B9689 Other specified bacterial agents as the cause of diseases classified elsewhere: Secondary | ICD-10-CM | POA: Diagnosis not present

## 2019-05-29 DIAGNOSIS — R509 Fever, unspecified: Secondary | ICD-10-CM | POA: Diagnosis not present

## 2019-05-29 DIAGNOSIS — Z20828 Contact with and (suspected) exposure to other viral communicable diseases: Secondary | ICD-10-CM | POA: Diagnosis not present

## 2019-05-29 DIAGNOSIS — L600 Ingrowing nail: Secondary | ICD-10-CM | POA: Diagnosis not present

## 2019-05-30 LAB — NOVEL CORONAVIRUS, NAA: SARS-CoV-2, NAA: NOT DETECTED

## 2019-06-02 ENCOUNTER — Other Ambulatory Visit: Payer: Self-pay | Admitting: Nurse Practitioner

## 2019-06-02 ENCOUNTER — Encounter: Payer: Self-pay | Admitting: Family Medicine

## 2019-06-02 MED ORDER — ONDANSETRON 4 MG PO TBDP: 4.0000 mg | ORAL_TABLET | Freq: Three times a day (TID) | ORAL | 0 refills | Status: DC | PRN

## 2019-06-02 MED ORDER — RIZATRIPTAN BENZOATE 10 MG PO TABS: 10.0000 mg | ORAL_TABLET | ORAL | 6 refills | Status: DC | PRN

## 2019-06-02 MED ORDER — GABAPENTIN 300 MG PO CAPS: 600.0000 mg | ORAL_CAPSULE | Freq: Two times a day (BID) | ORAL | 1 refills | Status: DC

## 2019-06-02 MED ORDER — BUPROPION HCL ER (XL) 300 MG PO TB24: 300.0000 mg | ORAL_TABLET | Freq: Every day | ORAL | 3 refills | Status: DC

## 2019-06-02 MED ORDER — OMEPRAZOLE 40 MG PO CPDR: 40.0000 mg | DELAYED_RELEASE_CAPSULE | Freq: Two times a day (BID) | ORAL | 4 refills | Status: DC

## 2019-06-02 MED ORDER — PROPRANOLOL HCL 20 MG PO TABS: 40.0000 mg | ORAL_TABLET | Freq: Two times a day (BID) | ORAL | 2 refills | Status: DC

## 2019-06-02 MED ORDER — ESCITALOPRAM OXALATE 20 MG PO TABS: 20.0000 mg | ORAL_TABLET | Freq: Every day | ORAL | 1 refills | Status: DC

## 2019-06-02 MED ORDER — MONTELUKAST SODIUM 10 MG PO TABS: 10.0000 mg | ORAL_TABLET | Freq: Every day | ORAL | 1 refills | Status: DC

## 2019-06-02 MED ORDER — BUSPIRONE HCL 5 MG PO TABS: 5.0000 mg | ORAL_TABLET | ORAL | 1 refills | Status: DC | PRN

## 2019-06-02 MED ORDER — CYCLOBENZAPRINE HCL 5 MG PO TABS: 5.0000 mg | ORAL_TABLET | Freq: Two times a day (BID) | ORAL | 0 refills | Status: DC | PRN

## 2019-06-02 MED ORDER — FEXOFENADINE HCL 180 MG PO TABS: 180.0000 mg | ORAL_TABLET | Freq: Every day | ORAL | 2 refills | Status: DC

## 2019-06-04 ENCOUNTER — Other Ambulatory Visit: Payer: Self-pay | Admitting: Nurse Practitioner

## 2019-06-04 DIAGNOSIS — M9906 Segmental and somatic dysfunction of lower extremity: Secondary | ICD-10-CM | POA: Diagnosis not present

## 2019-06-04 DIAGNOSIS — M5136 Other intervertebral disc degeneration, lumbar region: Secondary | ICD-10-CM | POA: Diagnosis not present

## 2019-06-04 DIAGNOSIS — M9903 Segmental and somatic dysfunction of lumbar region: Secondary | ICD-10-CM | POA: Diagnosis not present

## 2019-06-04 DIAGNOSIS — M1612 Unilateral primary osteoarthritis, left hip: Secondary | ICD-10-CM | POA: Diagnosis not present

## 2019-06-04 DIAGNOSIS — Z1239 Encounter for other screening for malignant neoplasm of breast: Secondary | ICD-10-CM

## 2019-06-04 NOTE — Progress Notes (Signed)
Mammogram order

## 2019-06-05 ENCOUNTER — Other Ambulatory Visit: Payer: Self-pay | Admitting: Nurse Practitioner

## 2019-06-05 MED ORDER — CLINDAMYCIN PHOSPHATE 2 % VA CREA: 1.0000 | TOPICAL_CREAM | Freq: Every day | VAGINAL | 0 refills | Status: DC

## 2019-06-05 NOTE — Progress Notes (Signed)
Clindamycin cream order

## 2019-06-11 DIAGNOSIS — M5136 Other intervertebral disc degeneration, lumbar region: Secondary | ICD-10-CM | POA: Diagnosis not present

## 2019-06-11 DIAGNOSIS — M9906 Segmental and somatic dysfunction of lower extremity: Secondary | ICD-10-CM | POA: Diagnosis not present

## 2019-06-11 DIAGNOSIS — M1612 Unilateral primary osteoarthritis, left hip: Secondary | ICD-10-CM | POA: Diagnosis not present

## 2019-06-11 DIAGNOSIS — M9903 Segmental and somatic dysfunction of lumbar region: Secondary | ICD-10-CM | POA: Diagnosis not present

## 2019-06-13 ENCOUNTER — Ambulatory Visit (INDEPENDENT_AMBULATORY_CARE_PROVIDER_SITE_OTHER): Payer: BC Managed Care – PPO | Admitting: Nurse Practitioner

## 2019-06-13 ENCOUNTER — Other Ambulatory Visit: Payer: Self-pay

## 2019-06-13 ENCOUNTER — Encounter: Payer: Self-pay | Admitting: Nurse Practitioner

## 2019-06-13 ENCOUNTER — Other Ambulatory Visit (HOSPITAL_COMMUNITY)
Admission: RE | Admit: 2019-06-13 | Discharge: 2019-06-13 | Disposition: A | Discharge status: Home / Self Care | Payer: BC Managed Care – PPO | Source: Ambulatory Visit | Attending: Nurse Practitioner | Admitting: Nurse Practitioner

## 2019-06-13 VITALS — BP 113/71 | HR 84 | Temp 99.2°F | Ht 62.5 in | Wt 205.4 lb

## 2019-06-13 DIAGNOSIS — Z0000 Encounter for general adult medical examination without abnormal findings: Secondary | ICD-10-CM | POA: Diagnosis not present

## 2019-06-13 DIAGNOSIS — F330 Major depressive disorder, recurrent, mild: Secondary | ICD-10-CM

## 2019-06-13 DIAGNOSIS — K219 Gastro-esophageal reflux disease without esophagitis: Secondary | ICD-10-CM

## 2019-06-13 DIAGNOSIS — Z113 Encounter for screening for infections with a predominantly sexual mode of transmission: Secondary | ICD-10-CM

## 2019-06-13 DIAGNOSIS — D508 Other iron deficiency anemias: Secondary | ICD-10-CM

## 2019-06-13 DIAGNOSIS — I471 Supraventricular tachycardia: Secondary | ICD-10-CM | POA: Diagnosis not present

## 2019-06-13 DIAGNOSIS — I10 Essential (primary) hypertension: Secondary | ICD-10-CM

## 2019-06-13 DIAGNOSIS — E559 Vitamin D deficiency, unspecified: Secondary | ICD-10-CM | POA: Diagnosis not present

## 2019-06-13 NOTE — Assessment & Plan Note (Signed)
Recheck labs today and continue to collaborate with hematology.

## 2019-06-13 NOTE — Assessment & Plan Note (Signed)
Chronic, stable on Propranolol.  Labs today.

## 2019-06-13 NOTE — Assessment & Plan Note (Signed)
Chronic, ongoing.  Continue current medication regimen.  Obtain Mag level today.

## 2019-06-13 NOTE — Patient Instructions (Signed)
Healthy Eating Following a healthy eating pattern may help you to achieve and maintain a healthy body weight, reduce the risk of chronic disease, and live a long and productive life. It is important to follow a healthy eating pattern at an appropriate calorie level for your body. Your nutritional needs should be met primarily through food by choosing a variety of nutrient-rich foods. What are tips for following this plan? Reading food labels  Read labels and choose the following: ? Reduced or low sodium. ? Juices with 100% fruit juice. ? Foods with low saturated fats and high polyunsaturated and monounsaturated fats. ? Foods with whole grains, such as whole wheat, cracked wheat, brown rice, and wild rice. ? Whole grains that are fortified with folic acid. This is recommended for women who are pregnant or who want to become pregnant.  Read labels and avoid the following: ? Foods with a lot of added sugars. These include foods that contain brown sugar, corn sweetener, corn syrup, dextrose, fructose, glucose, high-fructose corn syrup, honey, invert sugar, lactose, malt syrup, maltose, molasses, raw sugar, sucrose, trehalose, or turbinado sugar.  Do not eat more than the following amounts of added sugar per day:  6 teaspoons (25 g) for women.  9 teaspoons (38 g) for men. ? Foods that contain processed or refined starches and grains. ? Refined grain products, such as white flour, degermed cornmeal, white bread, and white rice. Shopping  Choose nutrient-rich snacks, such as vegetables, whole fruits, and nuts. Avoid high-calorie and high-sugar snacks, such as potato chips, fruit snacks, and candy.  Use oil-based dressings and spreads on foods instead of solid fats such as butter, stick margarine, or cream cheese.  Limit pre-made sauces, mixes, and "instant" products such as flavored rice, instant noodles, and ready-made pasta.  Try more plant-protein sources, such as tofu, tempeh, black beans,  edamame, lentils, nuts, and seeds.  Explore eating plans such as the Mediterranean diet or vegetarian diet. Cooking  Use oil to saut or stir-fry foods instead of solid fats such as butter, stick margarine, or lard.  Try baking, boiling, grilling, or broiling instead of frying.  Remove the fatty part of meats before cooking.  Steam vegetables in water or broth. Meal planning   At meals, imagine dividing your plate into fourths: ? One-half of your plate is fruits and vegetables. ? One-fourth of your plate is whole grains. ? One-fourth of your plate is protein, especially lean meats, poultry, eggs, tofu, beans, or nuts.  Include low-fat dairy as part of your daily diet. Lifestyle  Choose healthy options in all settings, including home, work, school, restaurants, or stores.  Prepare your food safely: ? Wash your hands after handling raw meats. ? Keep food preparation surfaces clean by regularly washing with hot, soapy water. ? Keep raw meats separate from ready-to-eat foods, such as fruits and vegetables. ? Cook seafood, meat, poultry, and eggs to the recommended internal temperature. ? Store foods at safe temperatures. In general:  Keep cold foods at 59F (4.4C) or below.  Keep hot foods at 159F (60C) or above.  Keep your freezer at [facility] (-17.8C) or below.  Foods are no longer safe to eat when they have been between the temperatures of 40-159F (4.4-60C) for more than 2 hours. What foods should I eat? Fruits Aim to eat 2 cup-equivalents of fresh, canned (in natural juice), or frozen fruits each day. Examples of 1 cup-equivalent of fruit include 1 small apple, 8 large strawberries, 1 cup canned fruit,  cup  dried fruit, or 1 cup 100% juice. Vegetables Aim to eat 2-3 cup-equivalents of fresh and frozen vegetables each day, including different varieties and colors. Examples of 1 cup-equivalent of vegetables include 2 medium carrots, 2 cups raw, leafy greens, 1 cup chopped  vegetable (raw or cooked), or 1 medium baked potato. Grains Aim to eat 6 ounce-equivalents of whole grains each day. Examples of 1 ounce-equivalent of grains include 1 slice of bread, 1 cup ready-to-eat cereal, 3 cups popcorn, or  cup cooked rice, pasta, or cereal. Meats and other proteins Aim to eat 5-6 ounce-equivalents of protein each day. Examples of 1 ounce-equivalent of protein include 1 egg, 1/2 cup nuts or seeds, or 1 tablespoon (16 g) peanut butter. A cut of meat or fish that is the size of a deck of cards is about 3-4 ounce-equivalents.  Of the protein you eat each week, try to have at least 8 ounces come from seafood. This includes salmon, trout, herring, and anchovies. Dairy Aim to eat 3 cup-equivalents of fat-free or low-fat dairy each day. Examples of 1 cup-equivalent of dairy include 1 cup (240 mL) milk, 8 ounces (250 g) yogurt, 1 ounces (44 g) natural cheese, or 1 cup (240 mL) fortified soy milk. Fats and oils  Aim for about 5 teaspoons (21 g) per day. Choose monounsaturated fats, such as canola and olive oils, avocados, peanut butter, and most nuts, or polyunsaturated fats, such as sunflower, corn, and soybean oils, walnuts, pine nuts, sesame seeds, sunflower seeds, and flaxseed. Beverages  Aim for six 8-oz glasses of water per day. Limit coffee to three to five 8-oz cups per day.  Limit caffeinated beverages that have added calories, such as soda and energy drinks.  Limit alcohol intake to no more than 1 drink a day for nonpregnant women and 2 drinks a day for men. One drink equals 12 oz of beer (355 mL), 5 oz of wine (148 mL), or 1 oz of hard liquor (44 mL). Seasoning and other foods  Avoid adding excess amounts of salt to your foods. Try flavoring foods with herbs and spices instead of salt.  Avoid adding sugar to foods.  Try using oil-based dressings, sauces, and spreads instead of solid fats. This information is based on general [place] nutrition guidelines. For more  information, visit choosemyplate.gov. Exact amounts may vary based on your nutrition needs. Summary  A healthy eating plan may help you to maintain a healthy weight, reduce the risk of chronic diseases, and stay active throughout your life.  Plan your meals. Make sure you eat the right portions of a variety of nutrient-rich foods.  Try baking, boiling, grilling, or broiling instead of frying.  Choose healthy options in all settings, including home, work, school, restaurants, or stores. This information is not intended to replace advice given to you by your health care provider. Make sure you discuss any questions you have with your health care provider. Document Released: 01/21/2018 Document Revised: 01/21/2018 Document Reviewed: 01/21/2018 Elsevier Patient Education  2020 [employer]  

## 2019-06-13 NOTE — Assessment & Plan Note (Signed)
Chronic, stable.  PHQ =4.  Denies SI/HI.  Continue current medication regimen and adjust as needed.

## 2019-06-13 NOTE — Assessment & Plan Note (Signed)
Continue supplement and recheck Vit D level. 

## 2019-06-13 NOTE — Assessment & Plan Note (Signed)
Chronic, stable with BP at goal.  Continue current medication regimen.  Obtain labs today.

## 2019-06-13 NOTE — Progress Notes (Signed)
BP 113/71   Pulse 84   Temp 99.2 F (37.3 C) (Oral)   Ht 5' 2.5" (1.588 m)   Wt 205 lb 6.4 oz (93.2 kg)   SpO2 97%   BMI 36.97 kg/m    Subjective:    Patient ID: Nancy KinnierKimberly Renee Schoonmaker, female    DOB: 09-21-1971, 48 y.o.   MRN: [id number]  HPI: Nancy Sanchez is a 48 y.o. female presenting on 06/13/2019 for comprehensive medical examination. Current medical complaints include:none  She currently lives with: self Menopausal Symptoms: no  Depression Screen done today and results listed below:  [facility] 06/13/2019 01/19/2019 08/14/2018 03/08/2018 12/25/2017  Decreased Interest 1 2 1 2 3   Down, Depressed, Hopeless 0 3 1 2 3   PHQ - 2 Score 1 5 2 4 6   Altered sleeping 1 2 1 1 3   Tired, decreased energy 1 2 2 1 3   Change in appetite 0 0 0 1 1  Feeling bad or failure about yourself  0 0 0 0 1  Trouble concentrating 1 3 3 2 2   Moving slowly or fidgety/restless 0 0 0 0 0  Suicidal thoughts 0 1 0 0 0  PHQ-9 Score 4 13 8 9 16   Difficult doing work/chores Not difficult at all Very difficult Not difficult at all Somewhat difficult -    The patient does not have a history of falls. I did not complete a risk assessment for falls. A plan of care for falls was not documented.   Past Medical History:  Past Medical History:  Diagnosis Date  . ADHD   . Allergy   . Anemia   . Anxiety   . Arthritis   . Asthma   . Barrett's esophagus   . Bulging lumbar disc    L5-S1  . Depression   . Endometriosis   . GERD (gastroesophageal reflux disease)   . Hernia, hiatal    DDD  . HPV in female   . Hypertension   . Migraine   . Osteoporosis     Surgical History:  Past Surgical History:  Procedure Laterality Date  . ARTHROSCOPIC REPAIR ACL Left   . CARPAL TUNNEL RELEASE Bilateral   . ESSURE TUBAL LIGATION    . GASTRIC BYPASS    . HEEL SPUR EXCISION Left   . INGUINAL HERNIA REPAIR  12/18/2017  . JOINT REPLACEMENT Left    knee  . TONSILLECTOMY      Medications:   Current Outpatient Medications on File Prior to Visit  Medication Sig  . acetaminophen (TYLENOL) 650 MG CR tablet Take by mouth.  Marland Kitchen. albuterol (VENTOLIN HFA) 108 (90 Base) MCG/ACT inhaler 1-2 puffs by inhalation 4 times a day when needed  . BIOTIN PO Take by mouth.  Marland Kitchen. buPROPion (WELLBUTRIN XL) 300 MG 24 hr tablet Take 1 tablet (300 mg total) by mouth daily.  . busPIRone (BUSPAR) 5 MG tablet Take 1 tablet (5 mg total) by mouth as needed (Take twice a day as needed for increased anxiety.).  Marland Kitchen. Cholecalciferol (D3-50) 50000 units capsule Take by mouth.  . clindamycin (CLEOCIN) 2 % vaginal cream Place 1 Applicatorful vaginally at bedtime.  . cyclobenzaprine (FLEXERIL) 5 MG tablet Take 1 tablet (5 mg total) by mouth 2 (two) times daily as needed for muscle spasms.  Marland Kitchen. docusate sodium (COLACE) 100 MG capsule Take 100 mg by mouth 2 (two) times daily as needed.  Marland Kitchen. escitalopram (LEXAPRO) 20 MG tablet Take 1 tablet (20 mg total) by  mouth daily.  . ferrous sulfate 324 (65 Fe) MG TBEC Take by mouth daily.  . fexofenadine (ALLEGRA) 180 MG tablet Take 1 tablet (180 mg total) by mouth daily.  . fluticasone (FLONASE) 50 MCG/ACT nasal spray Place into the nose.  . gabapentin (NEURONTIN) 300 MG capsule Take 2 capsules (600 mg total) by mouth 2 (two) times daily.  . magnesium oxide (MAG-OX) 400 MG tablet Take 400 mg by mouth 2 (two) times daily.  . meclizine (ANTIVERT) 25 MG tablet   . montelukast (SINGULAIR) 10 MG tablet Take 1 tablet (10 mg total) by mouth daily.  . Omega-3 1000 MG CAPS Take by mouth.  Marland Kitchen. omeprazole (PRILOSEC) 40 MG capsule Take 1 capsule (40 mg total) by mouth 2 (two) times daily before a meal.  . ondansetron (ZOFRAN ODT) 4 MG disintegrating tablet Take 1 tablet (4 mg total) by mouth every 8 (eight) hours as needed for nausea or vomiting.  . propranolol (INDERAL) 20 MG tablet Take 2 tablets (40 mg total) by mouth 2 (two) times daily.  . rizatriptan (MAXALT) 10 MG tablet Take 1 tablet (10 mg  total) by mouth as needed for migraine. May repeat after 2 hours if not better. No more than 2 pills in 24 hours  . SYMBICORT 160-4.5 MCG/ACT inhaler Inhale 2 puffs into the lungs 2 (two) times daily.  . TURMERIC PO Take by mouth.  . vitamin C (ASCORBIC ACID) 500 MG tablet Take 500 mg by mouth daily.  . Vitamin D, Ergocalciferol, (DRISDOL) 50000 units CAPS capsule    No current facility-administered medications on file prior to visit.     Allergies:  Allergies  Allergen Reactions  . Metronidazole Hives, Itching and Rash  . Oxycodone-Acetaminophen Nausea And Vomiting    Other reaction(s): Nausea And Vomiting nausea nausea  . Prednisone Anxiety    Other reaction(s): Other (See Comments) "agitated"  . Tape Rash  . Aspirin     Unable to take aspirin products since RNY Gastric Bypass  . Codeine     Other reaction(s): Other (See Comments)  . Nickel Itching    Itching from earrings -unsure of composition  . Nsaids Other (See Comments)    Gastric bypass  . Other Itching    Itching from earrings -unsure of composition  . Oxycodone Other (See Comments)    Other reaction(s): Vomiting  . Fluticasone-Salmeterol Anxiety    Other Reaction: jittery Other Reaction: jittery Other Reaction: jittery Other Reaction: jittery Other Reaction: jittery  . Latex Rash    Other Reaction: Rash on hands only Other Reaction: Rash on hands only  . Morphine Anxiety and Itching    Does well with dilaudid and toradol Does well with dilaudid and toradol    Social History:  Social History   Socioeconomic History  . Marital status: Single    Spouse name: Not on file  . Number of children: Not on file  . Years of education: Not on file  . Highest education level: Not on file  Occupational History  . Not on file  Social Needs  . Financial resource strain: Not on file  . Food insecurity    Worry: Not on file    Inability: Not on file  . Transportation needs    Medical: Not on file     Non-medical: Not on file  Tobacco Use  . Smoking status: Former Smoker    Quit date: 10/23/1998    Years since quitting: 20.6  . Smokeless tobacco: Never Used  Substance  and Sexual Activity  . Alcohol use: No  . Drug use: No  . Sexual activity: Yes    Birth control/protection: Surgical  Lifestyle  . Physical activity    Days per week: Not on file    Minutes per session: Not on file  . Stress: Not on file  Relationships  . Social Musician on phone: Not on file    Gets together: Not on file    Attends religious service: Not on file    Active member of club or organization: Not on file    Attends meetings of clubs or organizations: Not on file    Relationship status: Not on file  . Intimate partner violence    Fear of current or ex partner: Not on file    Emotionally abused: Not on file    Physically abused: Not on file    Forced sexual activity: Not on file  Other Topics Concern  . Not on file  Social History Narrative  . Not on file   Social History   Tobacco Use  Smoking Status Former Smoker  . Quit date: 10/23/1998  . Years since quitting: 20.6  Smokeless Tobacco Never Used   Social History   Substance and Sexual Activity  Alcohol Use No    Family History:  Family History  Adopted: Yes  Problem Relation Age of Onset  . Heart disease Father     Past medical history, surgical history, medications, allergies, family history and social history reviewed with patient today and changes made to appropriate areas of the chart.   Review of Systems - negative All other ROS negative except what is listed above and in the HPI.      Objective:    BP 113/71   Pulse 84   Temp 99.2 F (37.3 C) (Oral)   Ht 5' 2.5" (1.588 m)   Wt 205 lb 6.4 oz (93.2 kg)   SpO2 97%   BMI 36.97 kg/m   Wt Readings from Last 3 Encounters:  06/13/19 205 lb 6.4 oz (93.2 kg)  05/28/19 212 lb (96.2 kg)  04/29/19 214 lb (97.1 kg)    Physical Exam Vitals signs and nursing  note reviewed.  Constitutional:      General: She is awake. She is not in acute distress.    Appearance: She is well-developed. She is obese. She is not ill-appearing.  HENT:     Head: Normocephalic.     Right Ear: Hearing, tympanic membrane, ear canal and external ear normal.     Left Ear: Hearing, tympanic membrane and external ear normal.     Nose: Nose normal.     Mouth/Throat:     Mouth: Mucous membranes are moist.     Pharynx: Oropharynx is clear. Uvula midline.  Eyes:     General: Lids are normal.        Right eye: No discharge.        Left eye: No discharge.     Extraocular Movements: Extraocular movements intact.     Conjunctiva/sclera: Conjunctivae normal.     Pupils: Pupils are equal, round, and reactive to light.     Visual Fields: Right eye visual fields normal and left eye visual fields normal.  Neck:     Musculoskeletal: Normal range of motion and neck supple.     Thyroid: No thyromegaly.     Vascular: No carotid bruit.  Cardiovascular:     Rate and Rhythm: Normal rate and regular rhythm.  Heart sounds: Normal heart sounds. No murmur. No gallop.   Pulmonary:     Effort: Pulmonary effort is normal. No accessory muscle usage or respiratory distress.     Breath sounds: Normal breath sounds.  Chest:     Breasts:        Right: Normal. No swelling, bleeding, inverted nipple, mass, nipple discharge, skin change or tenderness.        Left: Normal. No swelling, bleeding, inverted nipple, mass, nipple discharge, skin change or tenderness.  Abdominal:     General: Bowel sounds are normal.     Palpations: Abdomen is soft. There is no hepatomegaly or splenomegaly.     Tenderness: There is no abdominal tenderness.  Genitourinary:    Exam position: Lithotomy position.     Labia:        Right: No rash or injury.        Left: No rash or injury.      Vagina: Normal.     Cervix: Normal.     Uterus: Normal.      Adnexa: Right adnexa normal and left adnexa normal.      Comments: Cervix anterior. Musculoskeletal:     Right lower leg: No edema.     Left lower leg: No edema.  Lymphadenopathy:     Cervical: No cervical adenopathy.     Upper Body:     Right upper body: No supraclavicular, axillary or pectoral adenopathy.     Left upper body: No supraclavicular, axillary or pectoral adenopathy.  Skin:    General: Skin is warm and dry.  Neurological:     Mental Status: She is alert and oriented to person, place, and time.     Cranial Nerves: Cranial nerves are intact.     Gait: Gait is intact.     Deep Tendon Reflexes: Reflexes are normal and symmetric.     Reflex Scores:      Brachioradialis reflexes are 2+ on the right side and 2+ on the left side.      Patellar reflexes are 2+ on the right side and 2+ on the left side. Psychiatric:        Attention and Perception: Attention normal.        Mood and Affect: Mood normal.        Speech: Speech normal.        Behavior: Behavior normal. Behavior is cooperative.        Thought Content: Thought content normal.        Cognition and Memory: Cognition normal.        Judgment: Judgment normal.     Results for orders placed or performed in visit on 05/28/19  Novel Coronavirus, NAA ([facility])   Specimen: Oropharyngeal(OP) collection in vial transport medium   OROPHARYNGEA  TESTING  Result Value Ref Range   SARS-CoV-2, NAA Not Detected Not Detected      Assessment & Plan:   Problem List Items Addressed This Visit      Cardiovascular and Mediastinum   Paroxysmal supraventricular tachycardia (HCC)    Chronic, stable on Propranolol.  Labs today.      Hypertension    Chronic, stable with BP at goal.  Continue current medication regimen.  Obtain labs today.         Relevant Orders   Comprehensive metabolic panel     Digestive   GERD (gastroesophageal reflux disease)    Chronic, ongoing.  Continue current medication regimen.  Obtain Mag level today.  Relevant Orders   Magnesium     Other    Iron deficiency anemia    Recheck labs today and continue to collaborate with hematology.        Relevant Orders   Iron and TIBC   B12 and Folate Panel   Ferritin   Vitamin D deficiency    Continue supplement and recheck Vit D level.      Relevant Orders   VITAMIN D 25 Hydroxy (Vit-D Deficiency, Fractures)   Mild episode of recurrent major depressive disorder (HCC)    Chronic, stable.  PHQ =4.  Denies SI/HI.  Continue current medication regimen and adjust as needed.         Other Visit Diagnoses    Encounter for annual physical exam    -  Primary   Relevant Orders   Cytology - PAP   CBC with Differential/Platelet   Lipid Panel w/o Chol/HDL Ratio   TSH   SARS-CoV-2 Antibodies   Screen for STD (sexually transmitted disease)       STD labs per request today.   Relevant Orders   HIV Antibody (routine testing w rflx)   HSV(herpes simplex vrs) 1+2 ab-IgG   RPR   GC/Chlamydia Probe Amp       Follow up plan: Return in about 6 months (around 12/14/2019) for Follow-up.   LABORATORY TESTING:  - Pap smear: pap done  IMMUNIZATIONS:   - Tdap: Tetanus vaccination status reviewed: last tetanus booster within 10 years. - Influenza: Up to date - Pneumovax: Up to date - Prevnar: Not applicable - HPV: Not applicable - Zostavax vaccine: Not applicable  SCREENING: -Mammogram: Ordered today  - Colonoscopy: Not applicable  - Bone Density: Not applicable  -Hearing Test: Not applicable  -Spirometry: Not applicable   PATIENT COUNSELING:   Advised to take 1 mg of folate supplement per day if capable of pregnancy.   Sexuality: Discussed sexually transmitted diseases, partner selection, use of condoms, avoidance of unintended pregnancy  and contraceptive alternatives.   Advised to avoid cigarette smoking.  I discussed with the patient that most people either abstain from alcohol or drink within safe limits (<=14/week and <=4 drinks/occasion for males, <=7/weeks and <= 3  drinks/occasion for females) and that the risk for alcohol disorders and other health effects rises proportionally with the number of drinks per week and how often a drinker exceeds daily limits.  Discussed cessation/primary prevention of drug use and availability of treatment for abuse.   Diet: Encouraged to adjust caloric intake to maintain  or achieve ideal body weight, to reduce intake of dietary saturated fat and total fat, to limit sodium intake by avoiding high sodium foods and not adding table salt, and to maintain adequate dietary potassium and calcium preferably from fresh fruits, vegetables, and low-fat dairy products.    stressed the importance of regular exercise  Injury prevention: Discussed safety belts, safety helmets, smoke detector, smoking near bedding or upholstery.   Dental health: Discussed importance of regular tooth brushing, flossing, and dental visits.    NEXT PREVENTATIVE PHYSICAL DUE IN 1 YEAR. Return in about 6 months (around 12/14/2019) for Follow-up.

## 2019-06-14 LAB — CBC WITH DIFFERENTIAL/PLATELET
Basophils Absolute: 0.1 10*3/uL (ref 0.0–0.2)
Basos: 1 %
EOS (ABSOLUTE): 0.2 10*3/uL (ref 0.0–0.4)
Eos: 3 %
Hematocrit: 39 % (ref 34.0–46.6)
Hemoglobin: 13.2 g/dL (ref 11.1–15.9)
Immature Grans (Abs): 0 10*3/uL (ref 0.0–0.1)
Immature Granulocytes: 0 %
Lymphocytes Absolute: 2 10*3/uL (ref 0.7–3.1)
Lymphs: 27 %
MCH: 31 pg (ref 26.6–33.0)
MCHC: 33.8 g/dL (ref 31.5–35.7)
MCV: 92 fL (ref 79–97)
Monocytes Absolute: 0.6 10*3/uL (ref 0.1–0.9)
Monocytes: 9 %
Neutrophils Absolute: 4.4 10*3/uL (ref 1.4–7.0)
Neutrophils: 60 %
Platelets: 358 10*3/uL (ref 150–450)
RBC: 4.26 x10E6/uL (ref 3.77–5.28)
RDW: 11.5 % — ABNORMAL LOW (ref 11.7–15.4)
WBC: 7.4 10*3/uL (ref 3.4–10.8)

## 2019-06-14 LAB — LIPID PANEL W/O CHOL/HDL RATIO
Cholesterol, Total: 194 mg/dL (ref 100–199)
HDL: 55 mg/dL (ref >39)
LDL Calculated: 96 mg/dL (ref 0–99)
Triglycerides: 216 mg/dL — ABNORMAL HIGH (ref 0–149)
VLDL Cholesterol Cal: 43 mg/dL — ABNORMAL HIGH (ref 5–40)

## 2019-06-14 LAB — COMPREHENSIVE METABOLIC PANEL
ALT: 14 IU/L (ref 0–32)
AST: 17 IU/L (ref 0–40)
Albumin/Globulin Ratio: 1.8 (ref 1.2–2.2)
Albumin: 4.5 g/dL (ref 3.8–4.8)
Alkaline Phosphatase: 69 IU/L (ref 39–117)
BUN/Creatinine Ratio: 33 — ABNORMAL HIGH (ref 9–23)
BUN: 19 mg/dL (ref 6–24)
Bilirubin Total: 0.2 mg/dL (ref 0.0–1.2)
CO2: 25 mmol/L (ref 20–29)
Calcium: 9.6 mg/dL (ref 8.7–10.2)
Chloride: 102 mmol/L (ref 96–106)
Creatinine, Ser: 0.57 mg/dL (ref 0.57–1.00)
GFR calc Af Amer: 127 mL/min/{1.73_m2} (ref >59)
GFR calc non Af Amer: 110 mL/min/{1.73_m2} (ref >59)
Globulin, Total: 2.5 g/dL (ref 1.5–4.5)
Glucose: 79 mg/dL (ref 65–99)
Potassium: 4.4 mmol/L (ref 3.5–5.2)
Sodium: 141 mmol/L (ref 134–144)
Total Protein: 7 g/dL (ref 6.0–8.5)

## 2019-06-14 LAB — HSV(HERPES SIMPLEX VRS) I + II AB-IGG
HSV 1 Glycoprotein G Ab, IgG: <0.91 {index} (ref 0.00–0.90)
HSV 2 IgG, Type Spec: >23.6 {index} — ABNORMAL HIGH (ref 0.00–0.90)

## 2019-06-14 LAB — VITAMIN D 25 HYDROXY (VIT D DEFICIENCY, FRACTURES): Vit D, 25-Hydroxy: 56.5 ng/mL (ref 30.0–100.0)

## 2019-06-14 LAB — IRON AND TIBC
Iron Saturation: 17 % (ref 15–55)
Iron: 59 ug/dL (ref 27–159)
Total Iron Binding Capacity: 349 ug/dL (ref 250–450)
UIBC: 290 ug/dL (ref 131–425)

## 2019-06-14 LAB — RPR: RPR Ser Ql: NONREACTIVE

## 2019-06-14 LAB — HIV ANTIBODY (ROUTINE TESTING W REFLEX): HIV Screen 4th Generation wRfx: NONREACTIVE

## 2019-06-14 LAB — B12 AND FOLATE PANEL
Folate: 6.8 ng/mL (ref >3.0)
Vitamin B-12: 679 pg/mL (ref 232–1245)

## 2019-06-14 LAB — TSH: TSH: 0.855 u[IU]/mL (ref 0.450–4.500)

## 2019-06-14 LAB — MAGNESIUM: Magnesium: 2.2 mg/dL (ref 1.6–2.3)

## 2019-06-14 LAB — FERRITIN: Ferritin: 184 ng/mL — ABNORMAL HIGH (ref 15–150)

## 2019-06-14 LAB — SARS-COV-2 ANTIBODIES: SARS-CoV-2 Antibodies: NEGATIVE

## 2019-06-16 ENCOUNTER — Other Ambulatory Visit: Payer: Self-pay

## 2019-06-16 ENCOUNTER — Encounter: Payer: Self-pay | Admitting: Family Medicine

## 2019-06-16 ENCOUNTER — Other Ambulatory Visit: Payer: Self-pay | Admitting: Nurse Practitioner

## 2019-06-16 ENCOUNTER — Other Ambulatory Visit: Payer: Self-pay | Admitting: Family Medicine

## 2019-06-16 DIAGNOSIS — Z131 Encounter for screening for diabetes mellitus: Secondary | ICD-10-CM

## 2019-06-17 LAB — CYTOLOGY - PAP
Adequacy: ABSENT
Diagnosis: NEGATIVE
HPV: DETECTED — AB

## 2019-06-17 LAB — BAYER DCA HB A1C WAIVED: HB A1C (BAYER DCA - WAIVED): 4.8 % (ref <7.0)

## 2019-06-17 LAB — GC/CHLAMYDIA PROBE AMP
Chlamydia trachomatis, NAA: NEGATIVE
Neisseria Gonorrhoeae by PCR: NEGATIVE

## 2019-06-17 MED ORDER — BUDESONIDE-FORMOTEROL FUMARATE 160-4.5 MCG/ACT IN AERO: 2.0000 | INHALATION_SPRAY | Freq: Two times a day (BID) | RESPIRATORY_TRACT | 3 refills | Status: DC

## 2019-06-17 MED ORDER — ALBUTEROL SULFATE HFA 108 (90 BASE) MCG/ACT IN AERS: INHALATION_SPRAY | RESPIRATORY_TRACT | 3 refills | Status: DC

## 2019-06-18 DIAGNOSIS — M9903 Segmental and somatic dysfunction of lumbar region: Secondary | ICD-10-CM | POA: Diagnosis not present

## 2019-06-18 DIAGNOSIS — M5136 Other intervertebral disc degeneration, lumbar region: Secondary | ICD-10-CM | POA: Diagnosis not present

## 2019-06-18 DIAGNOSIS — M1612 Unilateral primary osteoarthritis, left hip: Secondary | ICD-10-CM | POA: Diagnosis not present

## 2019-06-18 DIAGNOSIS — M9906 Segmental and somatic dysfunction of lower extremity: Secondary | ICD-10-CM | POA: Diagnosis not present

## 2019-06-18 MED ORDER — ALBUTEROL SULFATE (2.5 MG/3ML) 0.083% IN NEBU: 2.5000 mg | INHALATION_SOLUTION | Freq: Four times a day (QID) | RESPIRATORY_TRACT | 1 refills | Status: DC | PRN

## 2019-06-29 DIAGNOSIS — R1084 Generalized abdominal pain: Secondary | ICD-10-CM | POA: Diagnosis not present

## 2019-06-29 DIAGNOSIS — R42 Dizziness and giddiness: Secondary | ICD-10-CM | POA: Diagnosis not present

## 2019-06-29 DIAGNOSIS — R112 Nausea with vomiting, unspecified: Secondary | ICD-10-CM | POA: Diagnosis not present

## 2019-06-29 DIAGNOSIS — R079 Chest pain, unspecified: Secondary | ICD-10-CM | POA: Diagnosis not present

## 2019-07-01 DIAGNOSIS — R1084 Generalized abdominal pain: Secondary | ICD-10-CM | POA: Diagnosis not present

## 2019-07-01 DIAGNOSIS — R197 Diarrhea, unspecified: Secondary | ICD-10-CM | POA: Diagnosis not present

## 2019-07-02 DIAGNOSIS — M9903 Segmental and somatic dysfunction of lumbar region: Secondary | ICD-10-CM | POA: Diagnosis not present

## 2019-07-02 DIAGNOSIS — M1612 Unilateral primary osteoarthritis, left hip: Secondary | ICD-10-CM | POA: Diagnosis not present

## 2019-07-02 DIAGNOSIS — M5136 Other intervertebral disc degeneration, lumbar region: Secondary | ICD-10-CM | POA: Diagnosis not present

## 2019-07-02 DIAGNOSIS — M9906 Segmental and somatic dysfunction of lower extremity: Secondary | ICD-10-CM | POA: Diagnosis not present

## 2019-07-04 ENCOUNTER — Encounter: Payer: Self-pay | Admitting: Family Medicine

## 2019-07-12 DIAGNOSIS — M25511 Pain in right shoulder: Secondary | ICD-10-CM | POA: Diagnosis not present

## 2019-07-12 DIAGNOSIS — G8929 Other chronic pain: Secondary | ICD-10-CM | POA: Diagnosis not present

## 2019-07-14 ENCOUNTER — Other Ambulatory Visit: Payer: Self-pay | Admitting: Nurse Practitioner

## 2019-07-14 ENCOUNTER — Other Ambulatory Visit: Payer: BC Managed Care – PPO

## 2019-07-14 ENCOUNTER — Other Ambulatory Visit: Payer: Self-pay

## 2019-07-14 DIAGNOSIS — Z111 Encounter for screening for respiratory tuberculosis: Secondary | ICD-10-CM

## 2019-07-14 NOTE — Progress Notes (Signed)
Patient reports need for TB blood test for work purposes

## 2019-07-15 ENCOUNTER — Other Ambulatory Visit: Payer: Self-pay | Admitting: Nurse Practitioner

## 2019-07-15 DIAGNOSIS — Z1231 Encounter for screening mammogram for malignant neoplasm of breast: Secondary | ICD-10-CM

## 2019-07-16 DIAGNOSIS — M5136 Other intervertebral disc degeneration, lumbar region: Secondary | ICD-10-CM | POA: Diagnosis not present

## 2019-07-16 DIAGNOSIS — M9903 Segmental and somatic dysfunction of lumbar region: Secondary | ICD-10-CM | POA: Diagnosis not present

## 2019-07-16 DIAGNOSIS — M1612 Unilateral primary osteoarthritis, left hip: Secondary | ICD-10-CM | POA: Diagnosis not present

## 2019-07-16 DIAGNOSIS — M9906 Segmental and somatic dysfunction of lower extremity: Secondary | ICD-10-CM | POA: Diagnosis not present

## 2019-07-16 LAB — QUANTIFERON-TB GOLD PLUS
QuantiFERON Mitogen Value: >10 IU/mL
QuantiFERON Nil Value: 0.01 IU/mL
QuantiFERON TB1 Ag Value: 0.01 IU/mL
QuantiFERON TB2 Ag Value: 0.01 IU/mL
QuantiFERON-TB Gold Plus: NEGATIVE

## 2019-07-23 DIAGNOSIS — M9903 Segmental and somatic dysfunction of lumbar region: Secondary | ICD-10-CM | POA: Diagnosis not present

## 2019-07-23 DIAGNOSIS — M9906 Segmental and somatic dysfunction of lower extremity: Secondary | ICD-10-CM | POA: Diagnosis not present

## 2019-07-23 DIAGNOSIS — M5136 Other intervertebral disc degeneration, lumbar region: Secondary | ICD-10-CM | POA: Diagnosis not present

## 2019-07-23 DIAGNOSIS — M1612 Unilateral primary osteoarthritis, left hip: Secondary | ICD-10-CM | POA: Diagnosis not present

## 2019-07-31 ENCOUNTER — Ambulatory Visit (INDEPENDENT_AMBULATORY_CARE_PROVIDER_SITE_OTHER): Payer: BC Managed Care – PPO

## 2019-07-31 DIAGNOSIS — Z23 Encounter for immunization: Secondary | ICD-10-CM | POA: Diagnosis not present

## 2019-08-06 ENCOUNTER — Ambulatory Visit (INDEPENDENT_AMBULATORY_CARE_PROVIDER_SITE_OTHER): Payer: BC Managed Care – PPO

## 2019-08-06 ENCOUNTER — Ambulatory Visit (INDEPENDENT_AMBULATORY_CARE_PROVIDER_SITE_OTHER): Payer: BC Managed Care – PPO | Admitting: Podiatry

## 2019-08-06 ENCOUNTER — Other Ambulatory Visit: Payer: Self-pay

## 2019-08-06 ENCOUNTER — Encounter: Payer: Self-pay | Admitting: Podiatry

## 2019-08-06 VITALS — BP 108/72 | HR 68 | Resp 16

## 2019-08-06 DIAGNOSIS — M722 Plantar fascial fibromatosis: Secondary | ICD-10-CM

## 2019-08-06 DIAGNOSIS — M2141 Flat foot [pes planus] (acquired), right foot: Secondary | ICD-10-CM | POA: Diagnosis not present

## 2019-08-06 DIAGNOSIS — M2142 Flat foot [pes planus] (acquired), left foot: Secondary | ICD-10-CM

## 2019-08-06 NOTE — Progress Notes (Signed)
Subjective:  Patient ID: Nancy Sanchez, female    DOB: 31-Oct-1970,  MRN: [id number] HPI Chief Complaint  Patient presents with  . Foot Pain    Flat feet bilateral (R>L) - needs new orthotics, right foot seems the most painful at times  . New Patient (Initial Visit)    48 y.o. female presents with the above complaint.   ROS: Denies fever chills nausea vomiting muscle aches pains calf pain back pain chest pain shortness of breath.  Past Medical History:  Diagnosis Date  . ADHD   . Allergy   . Anemia   . Anxiety   . Arthritis   . Asthma   . Barrett's esophagus   . Bulging lumbar disc    L5-S1  . Depression   . Endometriosis   . GERD (gastroesophageal reflux disease)   . Hernia, hiatal    DDD  . HPV in female   . Hypertension   . Migraine   . Osteoporosis    Past Surgical History:  Procedure Laterality Date  . ARTHROSCOPIC REPAIR ACL Left   . CARPAL TUNNEL RELEASE Bilateral   . ESSURE TUBAL LIGATION    . GASTRIC BYPASS    . HEEL SPUR EXCISION Left   . INGUINAL HERNIA REPAIR  12/18/2017  . JOINT REPLACEMENT Left    knee  . TONSILLECTOMY      Current Outpatient Medications:  .  acetaminophen (TYLENOL) 650 MG CR tablet, Take by mouth., Disp: , Rfl:  .  albuterol (PROVENTIL) (2.5 MG/3ML) 0.083% nebulizer solution, Take 3 mLs (2.5 mg total) by nebulization every 6 (six) hours as needed for wheezing or shortness of breath., Disp: 150 mL, Rfl: 1 .  albuterol (VENTOLIN HFA) 108 (90 Base) MCG/ACT inhaler, 1-2 puffs by inhalation 4 times a day when needed, Disp: 18 g, Rfl: 3 .  BIOTIN PO, Take by mouth., Disp: , Rfl:  .  budesonide-formoterol (SYMBICORT) 160-4.5 MCG/ACT inhaler, Inhale 2 puffs into the lungs 2 (two) times daily., Disp: 10.2 g, Rfl: 3 .  buPROPion (WELLBUTRIN XL) 300 MG 24 hr tablet, Take 1 tablet (300 mg total) by mouth daily., Disp: 90 tablet, Rfl: 3 .  busPIRone (BUSPAR) 5 MG tablet, Take 1 tablet (5 mg total) by mouth as needed (Take twice a day as  needed for increased anxiety.)., Disp: 90 tablet, Rfl: 1 .  Cholecalciferol (D3-50) 50000 units capsule, Take by mouth., Disp: , Rfl:  .  cyclobenzaprine (FLEXERIL) 5 MG tablet, Take 1 tablet (5 mg total) by mouth 2 (two) times daily as needed for muscle spasms., Disp: 30 tablet, Rfl: 0 .  escitalopram (LEXAPRO) 20 MG tablet, Take 1 tablet (20 mg total) by mouth daily., Disp: 90 tablet, Rfl: 1 .  ferrous sulfate 324 (65 Fe) MG TBEC, Take by mouth daily., Disp: , Rfl:  .  fexofenadine (ALLEGRA) 180 MG tablet, Take 1 tablet (180 mg total) by mouth daily., Disp: 90 tablet, Rfl: 2 .  fluticasone (FLONASE) 50 MCG/ACT nasal spray, Place into the nose., Disp: , Rfl:  .  gabapentin (NEURONTIN) 300 MG capsule, Take 2 capsules (600 mg total) by mouth 2 (two) times daily., Disp: 360 capsule, Rfl: 1 .  magnesium oxide (MAG-OX) 400 MG tablet, Take 400 mg by mouth 2 (two) times daily., Disp: , Rfl:  .  meclizine (ANTIVERT) 25 MG tablet, , Disp: , Rfl:  .  montelukast (SINGULAIR) 10 MG tablet, Take 1 tablet (10 mg total) by mouth daily., Disp: 90 tablet, Rfl: 1 .  Omega-3 1000 MG CAPS, Take by mouth., Disp: , Rfl:  .  omeprazole (PRILOSEC) 40 MG capsule, Take 1 capsule (40 mg total) by mouth 2 (two) times daily before a meal., Disp: 60 capsule, Rfl: 4 .  ondansetron (ZOFRAN ODT) 4 MG disintegrating tablet, Take 1 tablet (4 mg total) by mouth every 8 (eight) hours as needed for nausea or vomiting., Disp: 20 tablet, Rfl: 0 .  propranolol (INDERAL) 20 MG tablet, Take 2 tablets (40 mg total) by mouth 2 (two) times daily., Disp: 360 tablet, Rfl: 2 .  rizatriptan (MAXALT) 10 MG tablet, Take 1 tablet (10 mg total) by mouth as needed for migraine. May repeat after 2 hours if not better. No more than 2 pills in 24 hours, Disp: 10 tablet, Rfl: 6 .  TURMERIC PO, Take by mouth., Disp: , Rfl:   Allergies  Allergen Reactions  . Metronidazole Hives, Itching and Rash  . Oxycodone-Acetaminophen Nausea And Vomiting    Other  reaction(s): Nausea And Vomiting nausea nausea  . Prednisone Anxiety    Other reaction(s): Other (See Comments) "agitated"  . Tape Rash  . Aspirin     Unable to take aspirin products since RNY Gastric Bypass  . Codeine     Other reaction(s): Other (See Comments)  . Nickel Itching    Itching from earrings -unsure of composition  . Nsaids Other (See Comments)    Gastric bypass  . Other Itching    Itching from earrings -unsure of composition  . Oxycodone Other (See Comments)    Other reaction(s): Vomiting  . Fluticasone-Salmeterol Anxiety    Other Reaction: jittery Other Reaction: jittery Other Reaction: jittery Other Reaction: jittery Other Reaction: jittery  . Latex Rash    Other Reaction: Rash on hands only Other Reaction: Rash on hands only  . Morphine Anxiety and Itching    Does well with dilaudid and toradol Does well with dilaudid and toradol   Review of Systems Objective:   Vitals:   08/06/19 1504  BP: 108/72  Pulse: 68  Resp: 16    General: Well developed, nourished, in no acute distress, alert and oriented x3   Dermatological: Skin is warm, dry and supple bilateral. Nails x 10 are well maintained; remaining integument appears unremarkable at this time. There are no open sores, no preulcerative lesions, no rash or signs of infection present.  Sharp incurvated nail margin along the fibular border of the hallux right.  Mild erythema no purulence no malodor.  Vascular: Dorsalis Pedis artery and Posterior Tibial artery pedal pulses are 2/4 bilateral with immedate capillary fill time. Pedal hair growth present. No varicosities and no lower extremity edema present bilateral.   Neruologic: Grossly intact via light touch bilateral. Vibratory intact via tuning fork bilateral. Protective threshold with Semmes Wienstein monofilament intact to all pedal sites bilateral. Patellar and Achilles deep tendon reflexes 2+ bilateral. No Babinski or clonus noted bilateral.    Musculoskeletal: No gross boney pedal deformities bilateral. No pain, crepitus, or limitation noted with foot and ankle range of motion bilateral. Muscular strength 5/5 in all groups tested bilateral.  She has full range of motion of the subtalar joint and pain on palpation medial Cokato tubercle of the right heel.  Gait: Unassisted, Nonantalgic.    Radiographs:  Radiographs taken today osseous immature individual demonstrates severe pes planus and osteoarthritic changes particular the right foot.  History of bone removal from the right foot is demonstrated by what appears to be methylmethacrylate implant.  Assessment & Plan:  Assessment: Pes planus and plantar fasciitis right.  Ingrown nail fibular border hallux right.  Plan: She was casted for orthotics today and I injected her right heel 20 mg Kenalog 5 mg Marcaine.  She would like to wait to have the border removed from the hallux nail right the next time she comes in.     Sonam Huelsmann T. [place], [place]

## 2019-08-14 ENCOUNTER — Telehealth: Payer: Self-pay | Admitting: Podiatry

## 2019-08-14 DIAGNOSIS — G8929 Other chronic pain: Secondary | ICD-10-CM | POA: Diagnosis not present

## 2019-08-14 DIAGNOSIS — M25511 Pain in right shoulder: Secondary | ICD-10-CM | POA: Diagnosis not present

## 2019-08-14 NOTE — Telephone Encounter (Signed)
Pt called back and said it was okay for me to mail her records to her. Stated she wanted her chiropractor to see them and load them into their system. I confirmed pt's address with her.

## 2019-08-14 NOTE — Telephone Encounter (Signed)
Medical records with disc of x-rays were placed up front to be mailed out tomorrow, Friday, 15 August 2019 to patient.

## 2019-08-14 NOTE — Telephone Encounter (Signed)
Left voicemail asking pt if she wanted me to complete her medical records request and mail the requested records to her since I'm in [place]. I stated if not, I could send it to the [place] office for them to do. Asked her to call me back directly to let me know how she wanted this handled.

## 2019-09-02 DIAGNOSIS — Z9884 Bariatric surgery status: Secondary | ICD-10-CM | POA: Diagnosis not present

## 2019-09-02 DIAGNOSIS — N920 Excessive and frequent menstruation with regular cycle: Secondary | ICD-10-CM | POA: Diagnosis not present

## 2019-09-02 DIAGNOSIS — D509 Iron deficiency anemia, unspecified: Secondary | ICD-10-CM | POA: Diagnosis not present

## 2019-09-03 ENCOUNTER — Other Ambulatory Visit: Payer: Self-pay

## 2019-09-03 ENCOUNTER — Ambulatory Visit (INDEPENDENT_AMBULATORY_CARE_PROVIDER_SITE_OTHER): Payer: BC Managed Care – PPO | Admitting: Podiatry

## 2019-09-03 DIAGNOSIS — M722 Plantar fascial fibromatosis: Secondary | ICD-10-CM | POA: Diagnosis not present

## 2019-09-03 DIAGNOSIS — M2142 Flat foot [pes planus] (acquired), left foot: Secondary | ICD-10-CM

## 2019-09-03 DIAGNOSIS — M2141 Flat foot [pes planus] (acquired), right foot: Secondary | ICD-10-CM

## 2019-09-03 DIAGNOSIS — L600 Ingrowing nail: Secondary | ICD-10-CM

## 2019-09-03 DIAGNOSIS — M79671 Pain in right foot: Secondary | ICD-10-CM

## 2019-09-03 NOTE — Patient Instructions (Addendum)
Soak Instructions    THE DAY AFTER THE PROCEDURE  Place 1/4 cup of epsom salts in a quart of warm tap water.  Submerge your foot or feet with outer bandage intact for the initial soak; this will allow the bandage to become moist and wet for easy lift off.  Once you remove your bandage, continue to soak in the solution for 20 minutes.  This soak should be done twice a day.  Next, remove your foot or feet from solution, blot dry the affected area and cover.  You may use a band aid large enough to cover the area or use gauze and tape.  Apply other medications to the area as directed by the doctor such as polysporin neosporin.  IF YOUR SKIN BECOMES IRRITATED WHILE USING THESE INSTRUCTIONS, IT IS OKAY TO SWITCH TO  WHITE VINEGAR AND WATER. Or you may use antibacterial soap and water to keep the toe clean  Monitor for any signs/symptoms of infection. Call the office immediately if any occur or go directly to the emergency room. Call with any questions/concerns.    [facility] Instructions-Post Nail Surgery  You have had your ingrown toenail and root treated with a chemical.  This chemical causes a burn that will drain and ooze like a blister.  This can drain for 6-8 weeks or longer.  It is important to keep this area clean, covered, and follow the soaking instructions dispensed at the time of your surgery.  This area will eventually dry and form a scab.  Once the scab forms you no longer need to soak or apply a dressing.  If at any time you experience an increase in pain, redness, swelling, or drainage, you should contact the office as soon as possible.  WEARING INSTRUCTIONS FOR ORTHOTICS  Don't expect to be comfortable wearing your orthotic devices for the first time.  Like eyeglasses, you may be aware of them as time passes, they will not be uncomfortable and you will enjoy wearing them.  FOLLOW THESE INSTRUCTIONS EXACTLY!  1. Wear your orthotic devices for:       Not more than 1 hour the  first day.       Not more than 2 hours the second day.       Not more than 3 hours the third day and so on.        Or wear them for as long as they feel comfortable.       If you experience discomfort in your feet or legs take them out.  When feet & legs feel       better, put them back in.  You do need to be consistent and wear them a little        everyday. 2.   If at any time the orthotic devices become acutely uncomfortable before the       time for that particular day, STOP WEARING THEM. 3.   On the next day, do not increase the wearing time. 4.   Subsequently, increase the wearing time by 15-30 minutes only if comfortable to do       so. 5.   You will be seen by your doctor about 2-4 weeks after you receive your orthotic       devices, at which time you will probably be wearing your devices comfortably        for about 8 hours or more a day. 6.   Some patients occasionally report mild aches or discomfort  in other parts of the of       body such as the knees, hips or back after 3 or 4 consecutive hours of wear.  If this       is the case with you, do not extend your wearing time.  Instead, cut it back an hour or       two.  In all likelihood, these symptoms will disappear in a short period of time as your       body posture realigns itself and functions more efficiently. 7.   It is possible that your orthotic device may require some small changes or adjustment       to improve their function or make them more comfortable.   This is usually not done       before one to three months have elapsed.  These adjustments are made in        accordance with the changed position your feet are assuming as a result of       improved biomechanical function. 8.   In women's shoes, it's not unusual for your heel to slip out of the shoe, particularly if       they are step-in-shoes.  If this is the case, try other shoes or other styles.  Try to       purchase shoes which have deeper heal seats or  higher heel counters. 9.   Squeaking of orthotics devices in the shoes is due to the movement of the devices       when they are functioning normally.  To eliminate squeaking, simply dust some       baby powder into your shoes before inserting the devices.  If this does not work,        apply soap or wax to the edges of the orthotic devices or put a tissue into the shoes. 10. It is important that you follow these directions explicitly.  Failure to do so will simply       prolong the adjustment period or create problems which are easily avoided.  It makes       no difference if you are wearing your orthotic devices for only a few hours after        several months, so long as you are wearing them comfortably for those hours. 11. If you have any questions or complaints, contact our office.  We have no way of       knowing about your problems unless you tell us.  If we do not hear from you, we will       assume that you are proceeding well.

## 2019-09-04 ENCOUNTER — Ambulatory Visit
Admission: RE | Admit: 2019-09-04 | Discharge: 2019-09-04 | Disposition: A | Discharge status: Home / Self Care | Payer: BC Managed Care – PPO | Source: Ambulatory Visit | Attending: Nurse Practitioner | Admitting: Nurse Practitioner

## 2019-09-04 DIAGNOSIS — Z1231 Encounter for screening mammogram for malignant neoplasm of breast: Secondary | ICD-10-CM | POA: Diagnosis not present

## 2019-09-05 ENCOUNTER — Encounter: Payer: Self-pay | Admitting: Podiatry

## 2019-09-05 NOTE — Progress Notes (Signed)
No follow-ups on file.  Subjective:  Patient ID: Nancy Sanchez, female    DOB: [birth year],  MRN: [id number]  No chief complaint on file.   48 y.o. female presents with the above complaint.  Patient presents today with complaint of right lateral hallux ingrown.  She states that it has been hurting her for a while now.  She has not seek any treatment for it.  She states that Epsom salt soaks or any other alleviating factors has not really helped.  She would like to have it removed today.  She has been treated by Dr. Al Corpus for primarily follow-up on the right plantar fascia.  She states her pain has improved but still hurting.  She is also here to pick up her orthotics today.  She states the injection last time did help.  She denies any other acute complaints today.   Review of Systems: Negative except as noted in the HPI. Denies N/V/F/Ch.  Past Medical History:  Diagnosis Date  . ADHD   . Allergy   . Anemia   . Anxiety   . Arthritis   . Asthma   . Barrett's esophagus   . Bulging lumbar disc    L5-S1  . Depression   . Endometriosis   . GERD (gastroesophageal reflux disease)   . Hernia, hiatal    DDD  . HPV in female   . Hypertension   . Migraine   . Osteoporosis     Current Outpatient Medications:  .  acetaminophen (TYLENOL) 650 MG CR tablet, Take by mouth., Disp: , Rfl:  .  albuterol (PROVENTIL) (2.5 MG/3ML) 0.083% nebulizer solution, Take 3 mLs (2.5 mg total) by nebulization every 6 (six) hours as needed for wheezing or shortness of breath., Disp: 150 mL, Rfl: 1 .  albuterol (VENTOLIN HFA) 108 (90 Base) MCG/ACT inhaler, 1-2 puffs by inhalation 4 times a day when needed, Disp: 18 g, Rfl: 3 .  BIOTIN PO, Take by mouth., Disp: , Rfl:  .  budesonide-formoterol (SYMBICORT) 160-4.5 MCG/ACT inhaler, Inhale 2 puffs into the lungs 2 (two) times daily., Disp: 10.2 g, Rfl: 3 .  buPROPion (WELLBUTRIN XL) 300 MG 24 hr tablet, Take 1 tablet (300 mg total) by mouth daily., Disp: 90  tablet, Rfl: 3 .  busPIRone (BUSPAR) 5 MG tablet, Take 1 tablet (5 mg total) by mouth as needed (Take twice a day as needed for increased anxiety.)., Disp: 90 tablet, Rfl: 1 .  Cholecalciferol (D3-50) 50000 units capsule, Take by mouth., Disp: , Rfl:  .  cyclobenzaprine (FLEXERIL) 5 MG tablet, Take 1 tablet (5 mg total) by mouth 2 (two) times daily as needed for muscle spasms., Disp: 30 tablet, Rfl: 0 .  escitalopram (LEXAPRO) 20 MG tablet, Take 1 tablet (20 mg total) by mouth daily., Disp: 90 tablet, Rfl: 1 .  ferrous sulfate 324 (65 Fe) MG TBEC, Take by mouth daily., Disp: , Rfl:  .  fexofenadine (ALLEGRA) 180 MG tablet, Take 1 tablet (180 mg total) by mouth daily., Disp: 90 tablet, Rfl: 2 .  fluticasone (FLONASE) 50 MCG/ACT nasal spray, Place into the nose., Disp: , Rfl:  .  gabapentin (NEURONTIN) 300 MG capsule, Take 2 capsules (600 mg total) by mouth 2 (two) times daily., Disp: 360 capsule, Rfl: 1 .  magnesium oxide (MAG-OX) 400 MG tablet, Take 400 mg by mouth 2 (two) times daily., Disp: , Rfl:  .  meclizine (ANTIVERT) 25 MG tablet, , Disp: , Rfl:  .  montelukast (SINGULAIR) 10 MG  tablet, Take 1 tablet (10 mg total) by mouth daily., Disp: 90 tablet, Rfl: 1 .  Omega-3 1000 MG CAPS, Take by mouth., Disp: , Rfl:  .  omeprazole (PRILOSEC) 40 MG capsule, Take 1 capsule (40 mg total) by mouth 2 (two) times daily before a meal., Disp: 60 capsule, Rfl: 4 .  ondansetron (ZOFRAN ODT) 4 MG disintegrating tablet, Take 1 tablet (4 mg total) by mouth every 8 (eight) hours as needed for nausea or vomiting., Disp: 20 tablet, Rfl: 0 .  propranolol (INDERAL) 20 MG tablet, Take 2 tablets (40 mg total) by mouth 2 (two) times daily., Disp: 360 tablet, Rfl: 2 .  rizatriptan (MAXALT) 10 MG tablet, Take 1 tablet (10 mg total) by mouth as needed for migraine. May repeat after 2 hours if not better. No more than 2 pills in 24 hours, Disp: 10 tablet, Rfl: 6 .  TURMERIC PO, Take by mouth., Disp: , Rfl:   Social History    Tobacco Use  Smoking Status Former Smoker  . Quit date: 10/23/1998  . Years since quitting: 20.8  Smokeless Tobacco Never Used    Allergies  Allergen Reactions  . Metronidazole Hives, Itching and Rash  . Oxycodone-Acetaminophen Nausea And Vomiting    Other reaction(s): Nausea And Vomiting nausea nausea  . Prednisone Anxiety    Other reaction(s): Other (See Comments) "agitated"  . Tape Rash  . Aspirin     Unable to take aspirin products since RNY Gastric Bypass  . Codeine     Other reaction(s): Other (See Comments)  . Nickel Itching    Itching from earrings -unsure of composition  . Nsaids Other (See Comments)    Gastric bypass  . Other Itching    Itching from earrings -unsure of composition  . Oxycodone Other (See Comments)    Other reaction(s): Vomiting  . Fluticasone-Salmeterol Anxiety    Other Reaction: jittery Other Reaction: jittery Other Reaction: jittery Other Reaction: jittery Other Reaction: jittery  . Latex Rash    Other Reaction: Rash on hands only Other Reaction: Rash on hands only  . Morphine Anxiety and Itching    Does well with dilaudid and toradol Does well with dilaudid and toradol   Objective:  There were no vitals filed for this visit. There is no height or weight on file to calculate BMI. Constitutional Well developed. Well nourished.  Vascular Dorsalis pedis pulses palpable bilaterally. Posterior tibial pulses palpable bilaterally. Capillary refill normal to all digits.  No cyanosis or clubbing noted. Pedal hair growth normal.  Neurologic Normal speech. Oriented to person, place, and time. Epicritic sensation to light touch grossly present bilaterally.  Dermatologic Painful ingrowing nail at lateral nail borders of the hallux nail right. No other open wounds. No skin lesions.  Orthopedic: Normal joint ROM without pain or crepitus bilaterally. No visible deformities. Tender to palpation at the calcaneal tuber right. No pain with  calcaneal squeeze right. Ankle ROM diminished range of motion right. Silfverskiold Test: positive right.   Radiographs: None Assessment:  No diagnosis found. Plan:  Patient was evaluated and treated and all questions answered.  Ingrown Nail, right -Patient elects to proceed with minor surgery to remove ingrown toenail removal today. Consent reviewed and signed by patient. -Ingrown nail excised. See procedure note. -Educated on post-procedure care including soaking. Written instructions provided and reviewed. -Patient to follow up in 2 weeks for nail check.  Plantar Fasciitis, right - XR reviewed as above.  - Re-Educated on icing and stretching. Instructions given.  -  Injection delivered to the plantar fascia as below. - Pharmacologic management:  Educated on risks/benefits and proper taking of medication.  Bilateral pes planus -Patient picked up her orthotics for pes planus deformity.  I believe this will help her control the plantar fascia as well as long-term management.  Procedure: Injection Tendon/Ligament Location: Right plantar fascia at the glabrous junction; medial approach. Skin Prep: alcohol Injectate: 0.5 cc 0.5% marcaine plain, 0.5 cc of 1% Lidocaine, 0.5 cc kenalog 10. Disposition: Patient tolerated procedure well. Injection site dressed with a band-aid.  Procedure: Excision of Ingrown Toenail Location: Right 1st toe lateral nail borders. Anesthesia: Lidocaine 1% plain; 1.5 mL and Marcaine 0.5% plain; 1.5 mL, digital block. Skin Prep: Betadine. Dressing: Silvadene; telfa; dry, sterile, compression dressing. Technique: Following skin prep, the toe was exsanguinated and a tourniquet was secured at the base of the toe. The affected nail border was freed, split with a nail splitter, and excised. Chemical matrixectomy was then performed with phenol and irrigated out with alcohol. The tourniquet was then removed and sterile dressing applied. Disposition: Patient tolerated  procedure well. Patient to return in 2 weeks for follow-up.   No follow-ups on file.

## 2019-09-08 ENCOUNTER — Inpatient Hospital Stay
Admission: RE | Admit: 2019-09-08 | Discharge: 2019-09-08 | Disposition: A | Discharge status: Home / Self Care | Payer: Self-pay | Source: Ambulatory Visit | Attending: *Deleted | Admitting: *Deleted

## 2019-09-08 ENCOUNTER — Other Ambulatory Visit: Payer: Self-pay | Admitting: *Deleted

## 2019-09-08 DIAGNOSIS — Z1231 Encounter for screening mammogram for malignant neoplasm of breast: Secondary | ICD-10-CM

## 2019-09-09 ENCOUNTER — Other Ambulatory Visit: Payer: Self-pay | Admitting: Nurse Practitioner

## 2019-09-09 DIAGNOSIS — D508 Other iron deficiency anemias: Secondary | ICD-10-CM

## 2019-09-11 ENCOUNTER — Other Ambulatory Visit: Payer: BC Managed Care – PPO

## 2019-09-11 ENCOUNTER — Other Ambulatory Visit: Payer: Self-pay

## 2019-09-11 DIAGNOSIS — G8929 Other chronic pain: Secondary | ICD-10-CM | POA: Diagnosis not present

## 2019-09-11 DIAGNOSIS — D508 Other iron deficiency anemias: Secondary | ICD-10-CM

## 2019-09-11 DIAGNOSIS — M25511 Pain in right shoulder: Secondary | ICD-10-CM | POA: Diagnosis not present

## 2019-09-12 LAB — CBC WITH DIFFERENTIAL/PLATELET
Basophils Absolute: 0 10*3/uL (ref 0.0–0.2)
Basos: 1 %
EOS (ABSOLUTE): 0.1 10*3/uL (ref 0.0–0.4)
Eos: 2 %
Hematocrit: 40.3 % (ref 34.0–46.6)
Hemoglobin: 13.7 g/dL (ref 11.1–15.9)
Immature Grans (Abs): 0 10*3/uL (ref 0.0–0.1)
Immature Granulocytes: 0 %
Lymphocytes Absolute: 1.5 10*3/uL (ref 0.7–3.1)
Lymphs: 23 %
MCH: 31 pg (ref 26.6–33.0)
MCHC: 34 g/dL (ref 31.5–35.7)
MCV: 91 fL (ref 79–97)
Monocytes Absolute: 0.5 10*3/uL (ref 0.1–0.9)
Monocytes: 7 %
Neutrophils Absolute: 4.3 10*3/uL (ref 1.4–7.0)
Neutrophils: 67 %
Platelets: 303 10*3/uL (ref 150–450)
RBC: 4.42 x10E6/uL (ref 3.77–5.28)
RDW: 11.9 % (ref 11.7–15.4)
WBC: 6.4 10*3/uL (ref 3.4–10.8)

## 2019-09-12 LAB — FERRITIN: Ferritin: 43 ng/mL (ref 15–150)

## 2019-09-16 ENCOUNTER — Ambulatory Visit (INDEPENDENT_AMBULATORY_CARE_PROVIDER_SITE_OTHER): Payer: BC Managed Care – PPO | Admitting: Nurse Practitioner

## 2019-09-16 ENCOUNTER — Other Ambulatory Visit: Payer: Self-pay

## 2019-09-16 ENCOUNTER — Encounter: Payer: Self-pay | Admitting: Nurse Practitioner

## 2019-09-16 DIAGNOSIS — B354 Tinea corporis: Secondary | ICD-10-CM | POA: Diagnosis not present

## 2019-09-16 MED ORDER — FLUCONAZOLE 150 MG PO TABS: 150.0000 mg | ORAL_TABLET | Freq: Once | ORAL | 0 refills | Status: AC

## 2019-09-16 MED ORDER — NYSTATIN 100000 UNIT/GM EX POWD: Freq: Three times a day (TID) | CUTANEOUS | 4 refills | Status: DC

## 2019-09-16 NOTE — Patient Instructions (Signed)

## 2019-09-16 NOTE — Assessment & Plan Note (Signed)
Acute with no improvement with all OTC methods.  Scripts for Nystatin powder and oral Diflucan x one dose sent.  Recommend keeping area dry and clean, let dry thoroughly prior to placing clothes on.  Wear light clothing and keep area dry throughout daytime hours.  Return to office for worsening or continued issues.

## 2019-09-16 NOTE — Progress Notes (Signed)
BP 127/77   Pulse 85   Temp 98.3 F (36.8 C)   LMP 09/04/2019   SpO2 97%    Subjective:    Patient ID: Nancy Sanchez, female    DOB: [birth year], 48 y.o.   MRN: [id number]  HPI: Nancy Sanchez is a 48 y.o. female  Chief Complaint  Patient presents with  . Rash    lower abdomen   RASH Reports overall several years, since age 59, gets "heat rash" under abdominal folds.  Has tried corn starch, steroid cream, and anti-itch cream.  Current episode has been ongoing x 3 weeks.  Used hemorrhoid cream, hydrocortisone cream, corn starch, TUCKS, and Anasep.   Duration:  weeks  Location: trunk and groin  Itching: yes Burning: no Redness: yes Oozing: no Scaling: no Blisters: no Painful: no Fevers: no Change in detergents/soaps/personal care products: no Recent illness: no Recent travel:no History of same: yes Context: fluctuating Alleviating factors: hydrocortisone cream Treatments attempted:hydrocortisone cream Shortness of breath: no  Throat/tongue swelling: no Myalgias/arthralgias: no  Relevant past medical, surgical, family and social history reviewed and updated as indicated. Interim medical history since our last visit reviewed. Allergies and medications reviewed and updated.  Review of Systems  Constitutional: Negative for activity change, appetite change, diaphoresis, fatigue and fever.  Respiratory: Negative for cough, chest tightness and shortness of breath.   Cardiovascular: Negative for chest pain, palpitations and leg swelling.  Gastrointestinal: Negative for abdominal distention, abdominal pain, constipation, diarrhea, nausea and vomiting.  Skin: Positive for rash.    Per HPI unless specifically indicated above     Objective:    BP 127/77   Pulse 85   Temp 98.3 F (36.8 C)   LMP 09/04/2019   SpO2 97%   Wt Readings from Last 3 Encounters:  06/13/19 205 lb 6.4 oz (93.2 kg)  05/28/19 212 lb (96.2 kg)  04/29/19 214 lb (97.1 kg)     Physical Exam Vitals signs and nursing note reviewed.  Constitutional:      General: She is awake. She is not in acute distress.    Appearance: She is well-developed. She is obese. She is not ill-appearing.  HENT:     Head: Normocephalic.     Right Ear: Hearing normal.     Left Ear: Hearing normal.     Nose: Nose normal.     Mouth/Throat:     Mouth: Mucous membranes are moist.  Eyes:     General: Lids are normal.        Right eye: No discharge.        Left eye: No discharge.     Conjunctiva/sclera: Conjunctivae normal.     Pupils: Pupils are equal, round, and reactive to light.  Neck:     Musculoskeletal: Normal range of motion and neck supple.  Cardiovascular:     Rate and Rhythm: Normal rate and regular rhythm.     Heart sounds: Normal heart sounds. No murmur. No gallop.   Pulmonary:     Effort: Pulmonary effort is normal. No accessory muscle usage or respiratory distress.     Breath sounds: Normal breath sounds.  Abdominal:     General: Bowel sounds are normal.     Palpations: Abdomen is soft.  Musculoskeletal:     Right lower leg: No edema.     Left lower leg: No edema.  Skin:    General: Skin is warm and dry.     Findings: Rash present.     Comments: Mild  erythema with slight scaling around exterior under abdominal folds and to groin area R>L.  No drainage, skin intact.    Neurological:     Mental Status: She is alert and oriented to person, place, and time.  Psychiatric:        Attention and Perception: Attention normal.        Mood and Affect: Mood normal.        Behavior: Behavior normal. Behavior is cooperative.        Thought Content: Thought content normal.        Judgment: Judgment normal.     Results for orders placed or performed in visit on 09/11/19  Ferritin  Result Value Ref Range   Ferritin 43 15 - 150 ng/mL  CBC with Differential/Platelet out  Result Value Ref Range   WBC 6.4 3.4 - 10.8 x10E3/uL   RBC 4.42 3.77 - 5.28 x10E6/uL   Hemoglobin  13.7 11.1 - 15.9 g/dL   Hematocrit 40.3 34.0 - 46.6 %   MCV 91 79 - 97 fL   MCH 31.0 26.6 - 33.0 pg   MCHC 34.0 31.5 - 35.7 g/dL   RDW 11.9 11.7 - 15.4 %   Platelets 303 150 - 450 x10E3/uL   Neutrophils 67 Not Estab. %   Lymphs 23 Not Estab. %   Monocytes 7 Not Estab. %   Eos 2 Not Estab. %   Basos 1 Not Estab. %   Neutrophils Absolute 4.3 1.4 - 7.0 x10E3/uL   Lymphocytes Absolute 1.5 0.7 - 3.1 x10E3/uL   Monocytes Absolute 0.5 0.1 - 0.9 x10E3/uL   EOS (ABSOLUTE) 0.1 0.0 - 0.4 x10E3/uL   Basophils Absolute 0.0 0.0 - 0.2 x10E3/uL   Immature Granulocytes 0 Not Estab. %   Immature Grans (Abs) 0.0 0.0 - 0.1 x10E3/uL      Assessment & Plan:   Problem List Items Addressed This Visit      Musculoskeletal and Integument   Tinea corporis    Acute with no improvement with all OTC methods.  Scripts for Nystatin powder and oral Diflucan x one dose sent.  Recommend keeping area dry and clean, let dry thoroughly prior to placing clothes on.  Wear light clothing and keep area dry throughout daytime hours.  Return to office for worsening or continued issues.      Relevant Medications   nystatin (MYCOSTATIN/NYSTOP) powder   fluconazole (DIFLUCAN) 150 MG tablet       Follow up plan: Return if symptoms worsen or fail to improve.

## 2019-09-21 IMAGING — CR DG KNEE COMPLETE 4+V*L*
4 series · 4 of 4 positions shown · non-contrast
Comparison: None.

CLINICAL DATA: 46-year-old female with a history of chronic knee
pain

EXAM:
LEFT KNEE - COMPLETE 4+ VIEW

[knee ap]
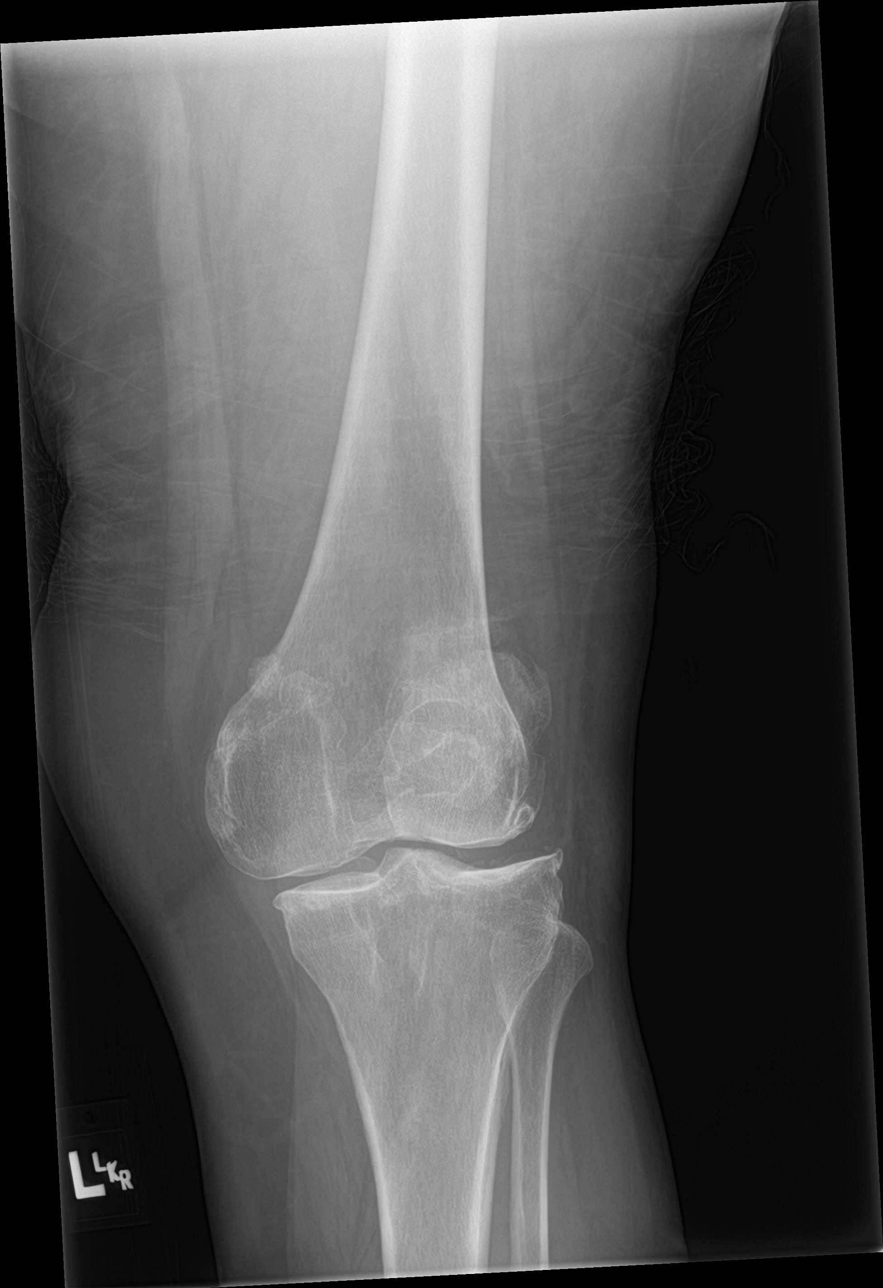

[knee obl (1 of 2)]
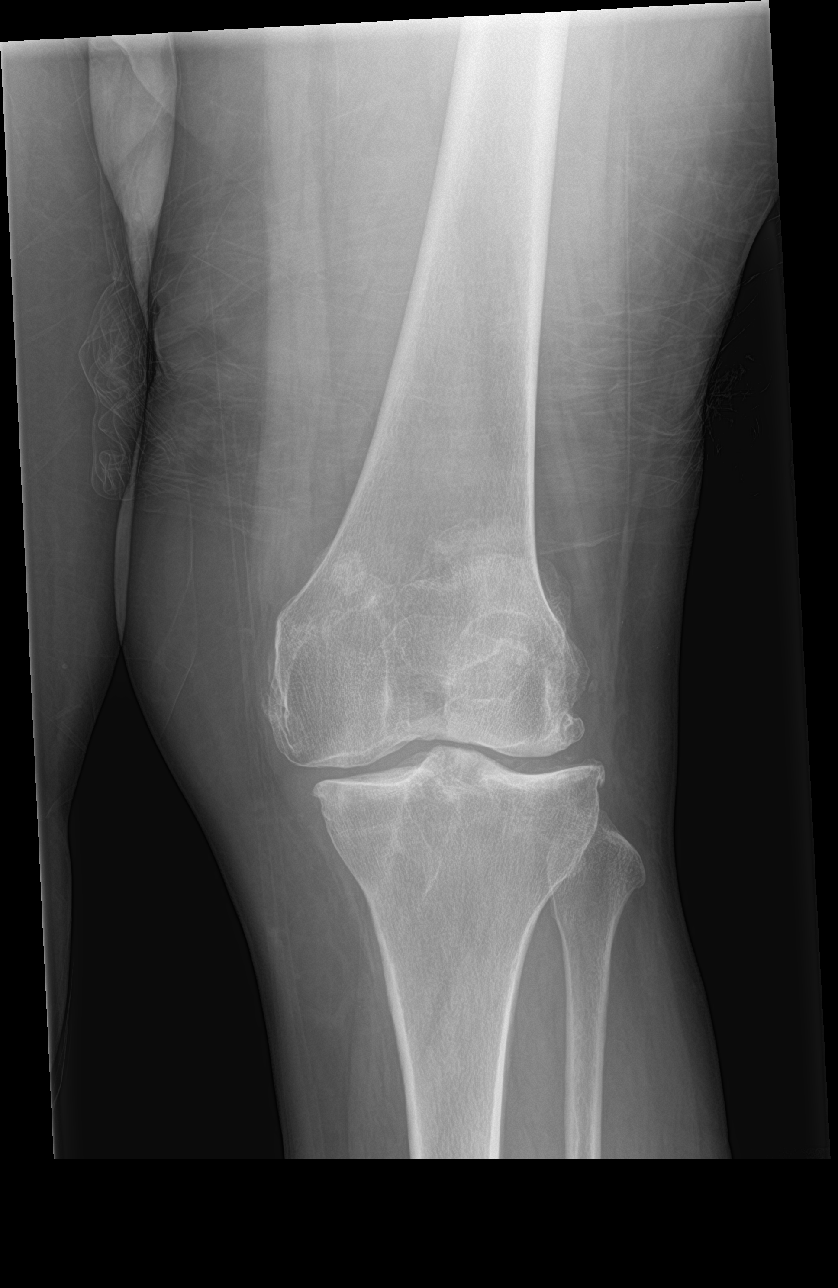

[knee obl (2 of 2)]
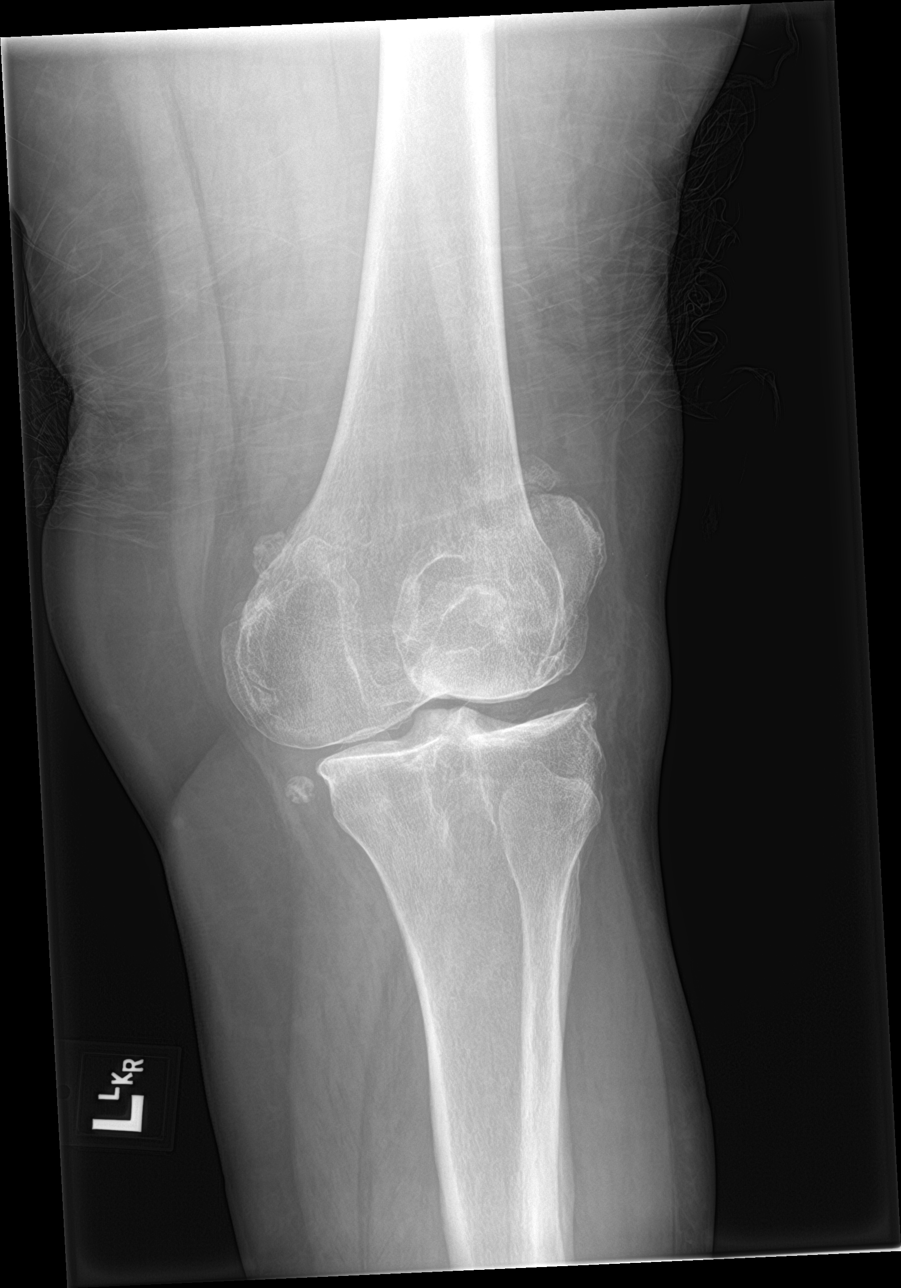

[knee lat]
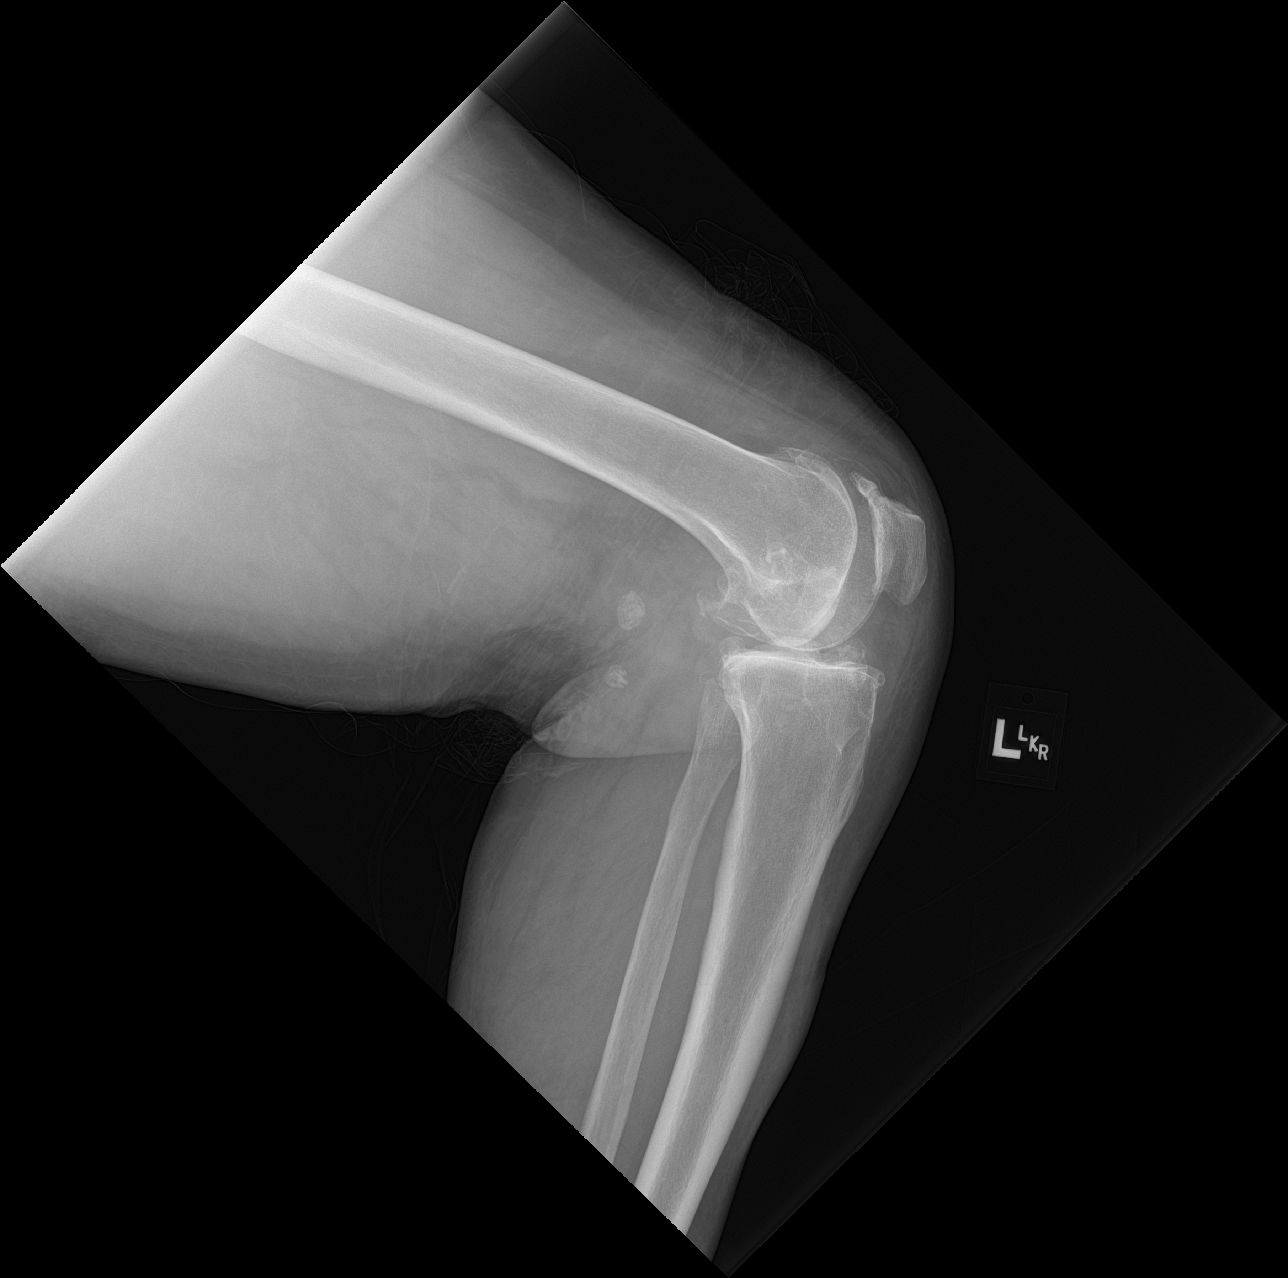

[4 of 4 positions shown; findings below may reference images not displayed]

FINDINGS: No acute displaced fracture. Joint space narrowing and marginal
osteophyte formation of the medial and lateral joint space.
Degenerative changes at the patellofemoral joint. Loose bodies
within the soft tissues behind the knee. No evidence of joint
effusion.
IMPRESSION: No acute bony abnormality.

Advanced tricompartmental osteoarthritis.

## 2019-09-21 IMAGING — CR DG LUMBAR SPINE COMPLETE 4+V
5 series · 5 of 5 positions shown · non-contrast
Comparison: None.

CLINICAL DATA: 46-year-old female with a history of lumbar back
pain

EXAM:
LUMBAR SPINE - COMPLETE 4+ VIEW

[l-spine ap]
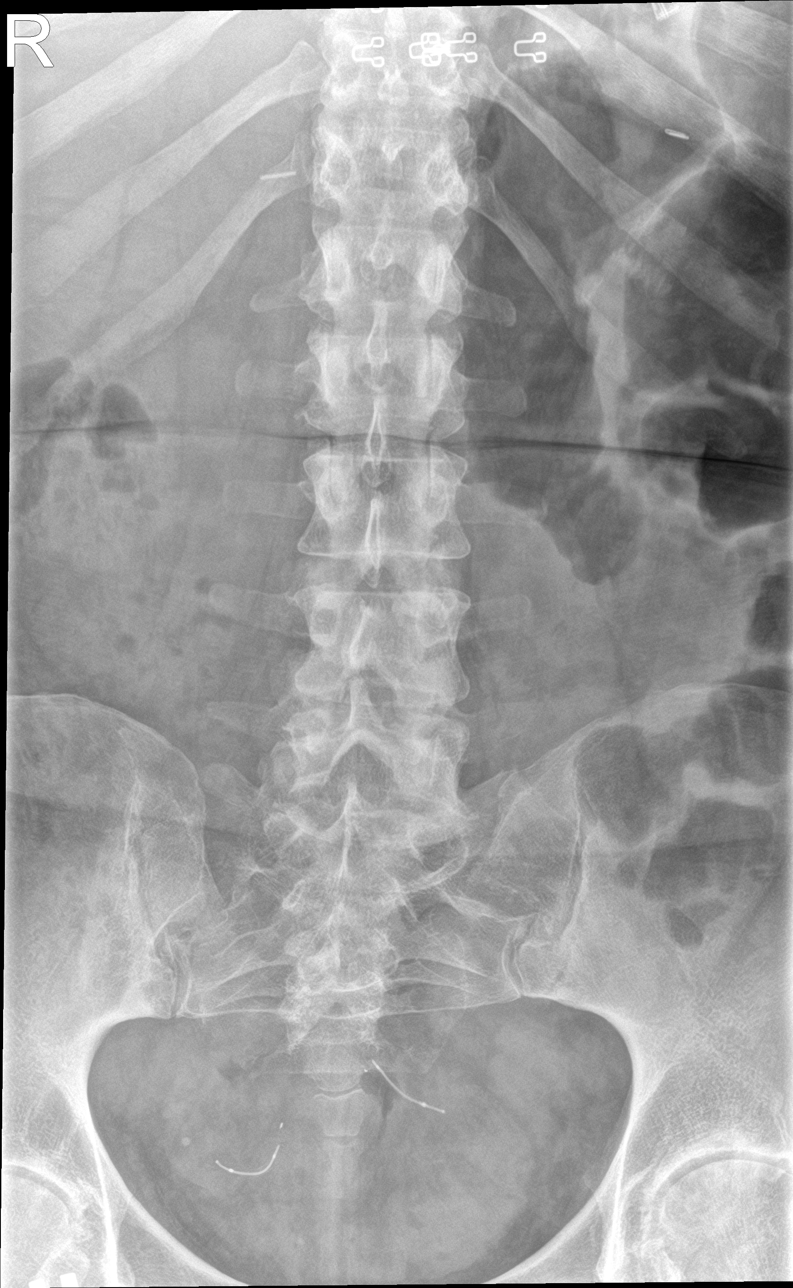

[l-spine obl (1 of 2)]
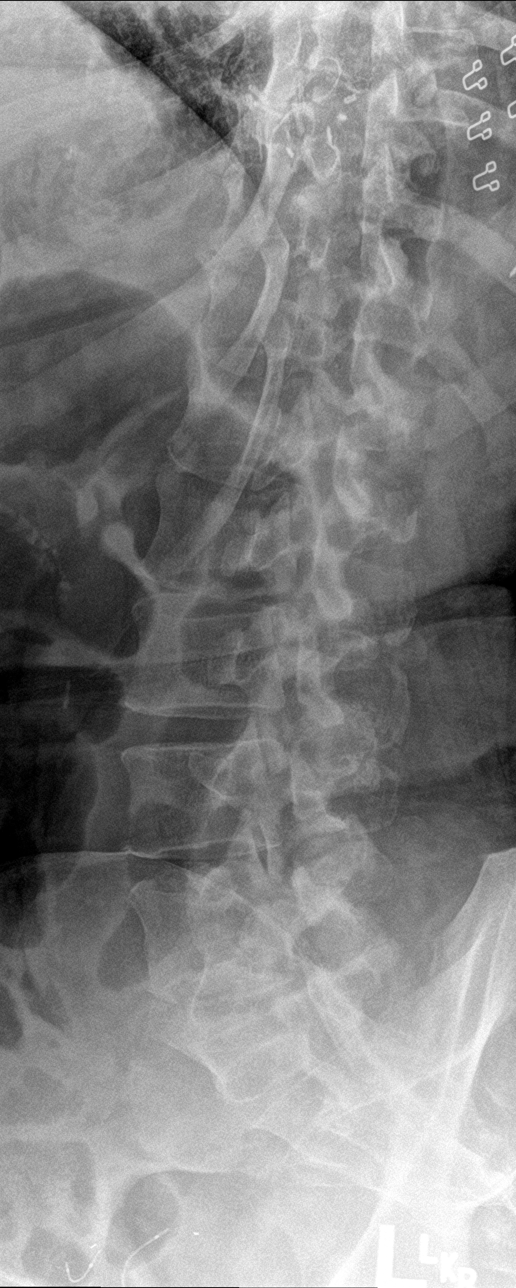

[l-spine obl (2 of 2)]
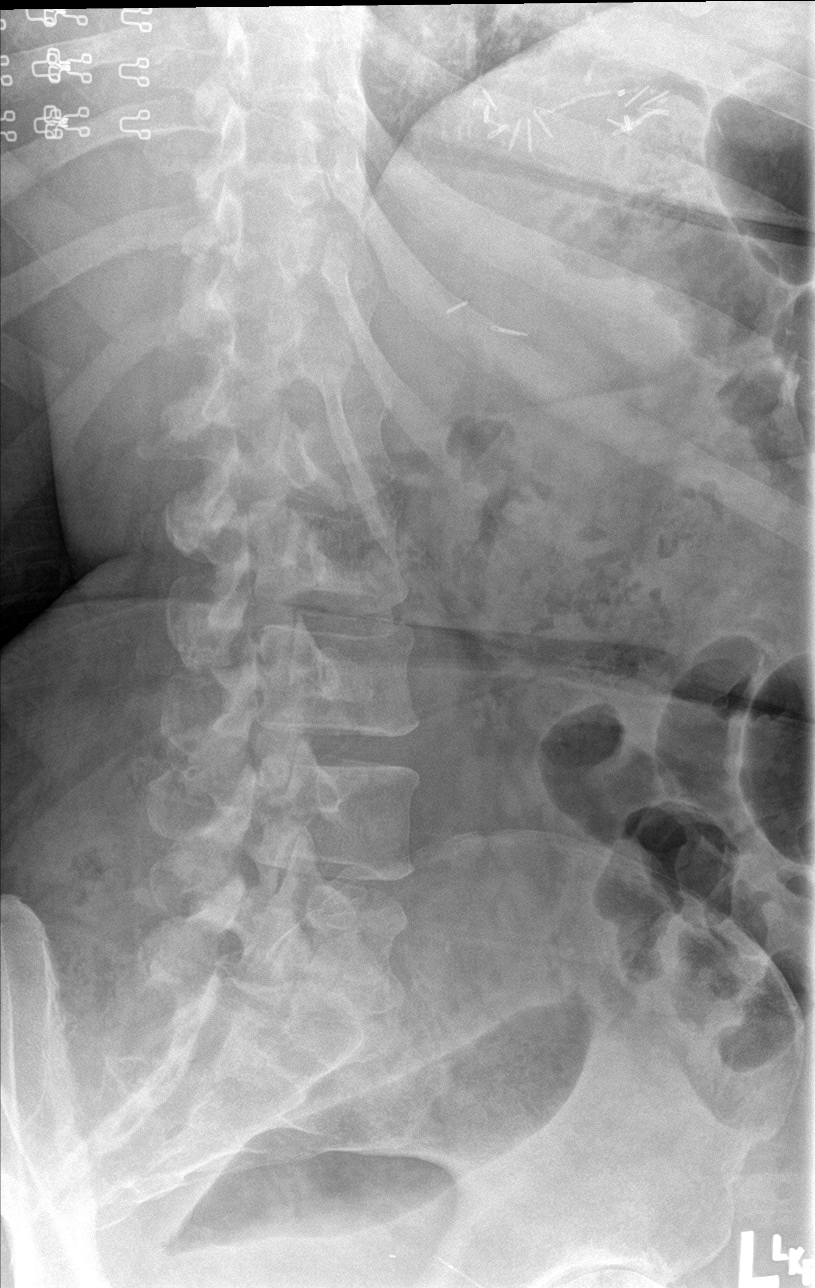

[l-spine lat]
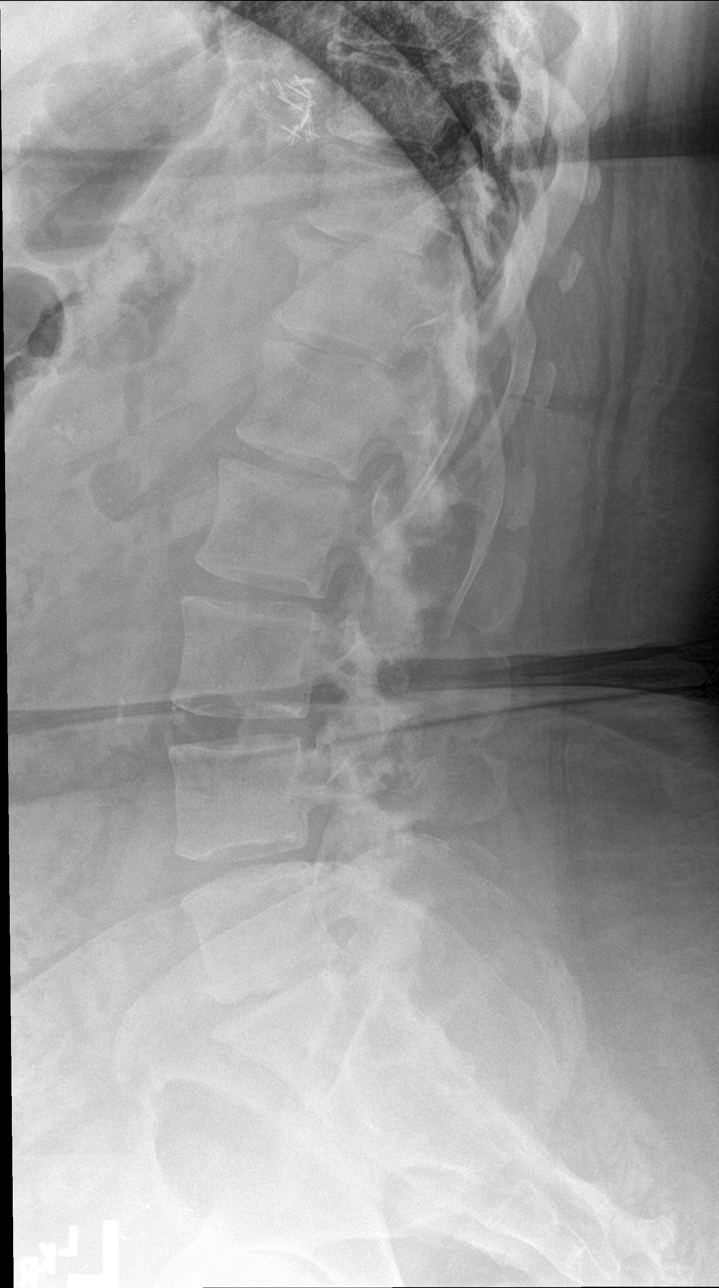

[l-spine spot]
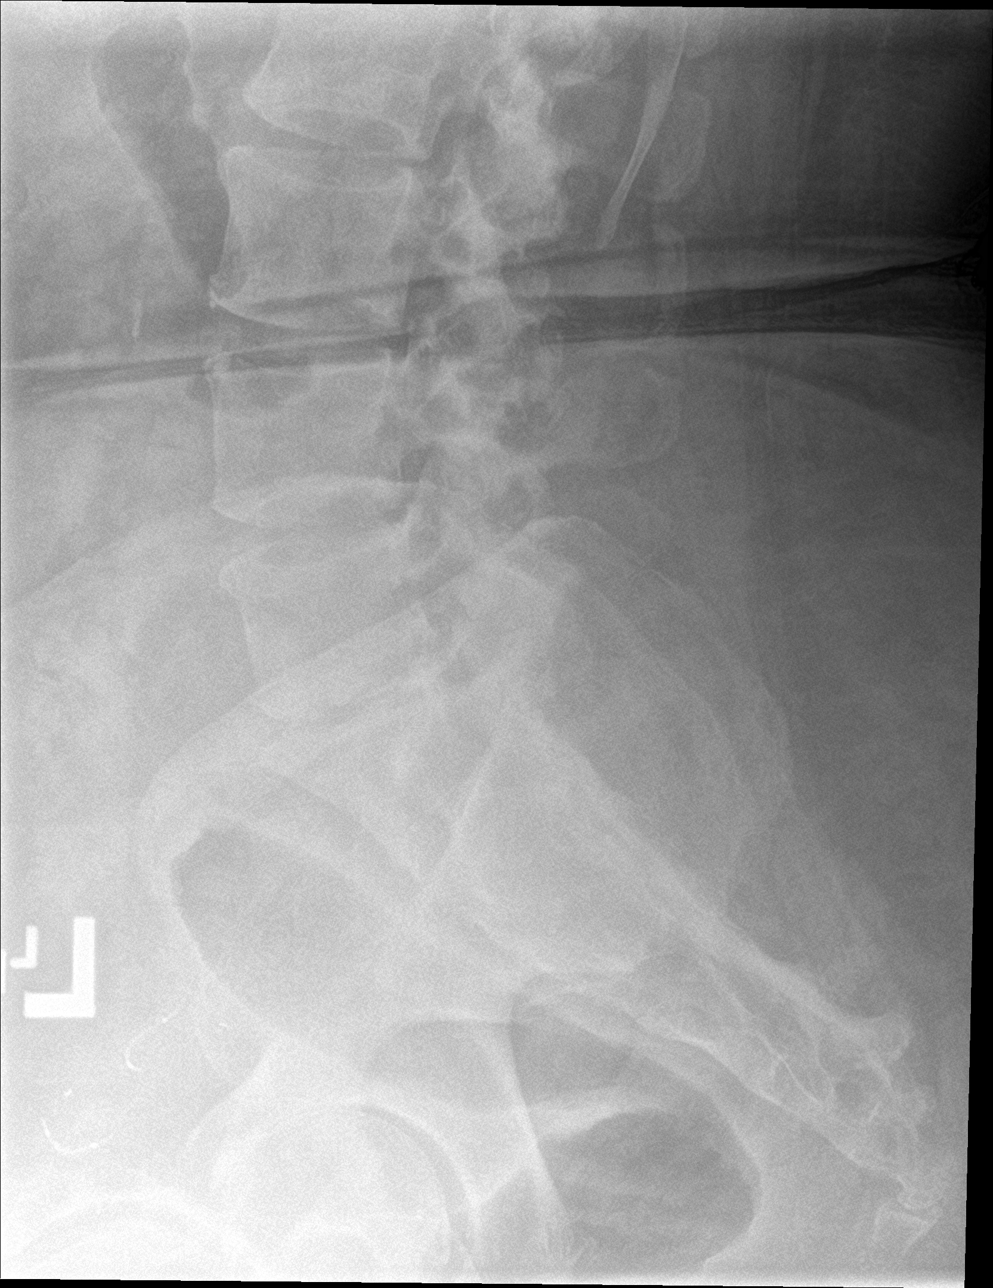

[5 of 5 positions shown; findings below may reference images not displayed]

FINDINGS: Lumbar Spine:

Lumbar vertebral elements maintain normal alignment without evidence
of subluxation..

No fracture line identified.  Vertebral body heights maintained.

Oblique images demonstrate no displaced pars defect.

Disc space narrowing with endplate sclerosis and associated facet
disease is most pronounced at L5-S1. More mild facet disease at
other levels.

Unremarkable appearance of the visualized abdomen.

Changes of prior tubal ligation.
IMPRESSION: Negative for acute fracture or malalignment of the lumbar spine.

Disc disease and facet disease at L5-S1.

## 2019-09-21 IMAGING — CR DG HIP (WITH OR WITHOUT PELVIS) 2-3V*L*
3 series · 3 of 3 positions shown · non-contrast
Comparison: None.

CLINICAL DATA: Chronic left hip pain.

EXAM:
DG HIP (WITH OR WITHOUT PELVIS) 2-3V LEFT

[pelvis ap]
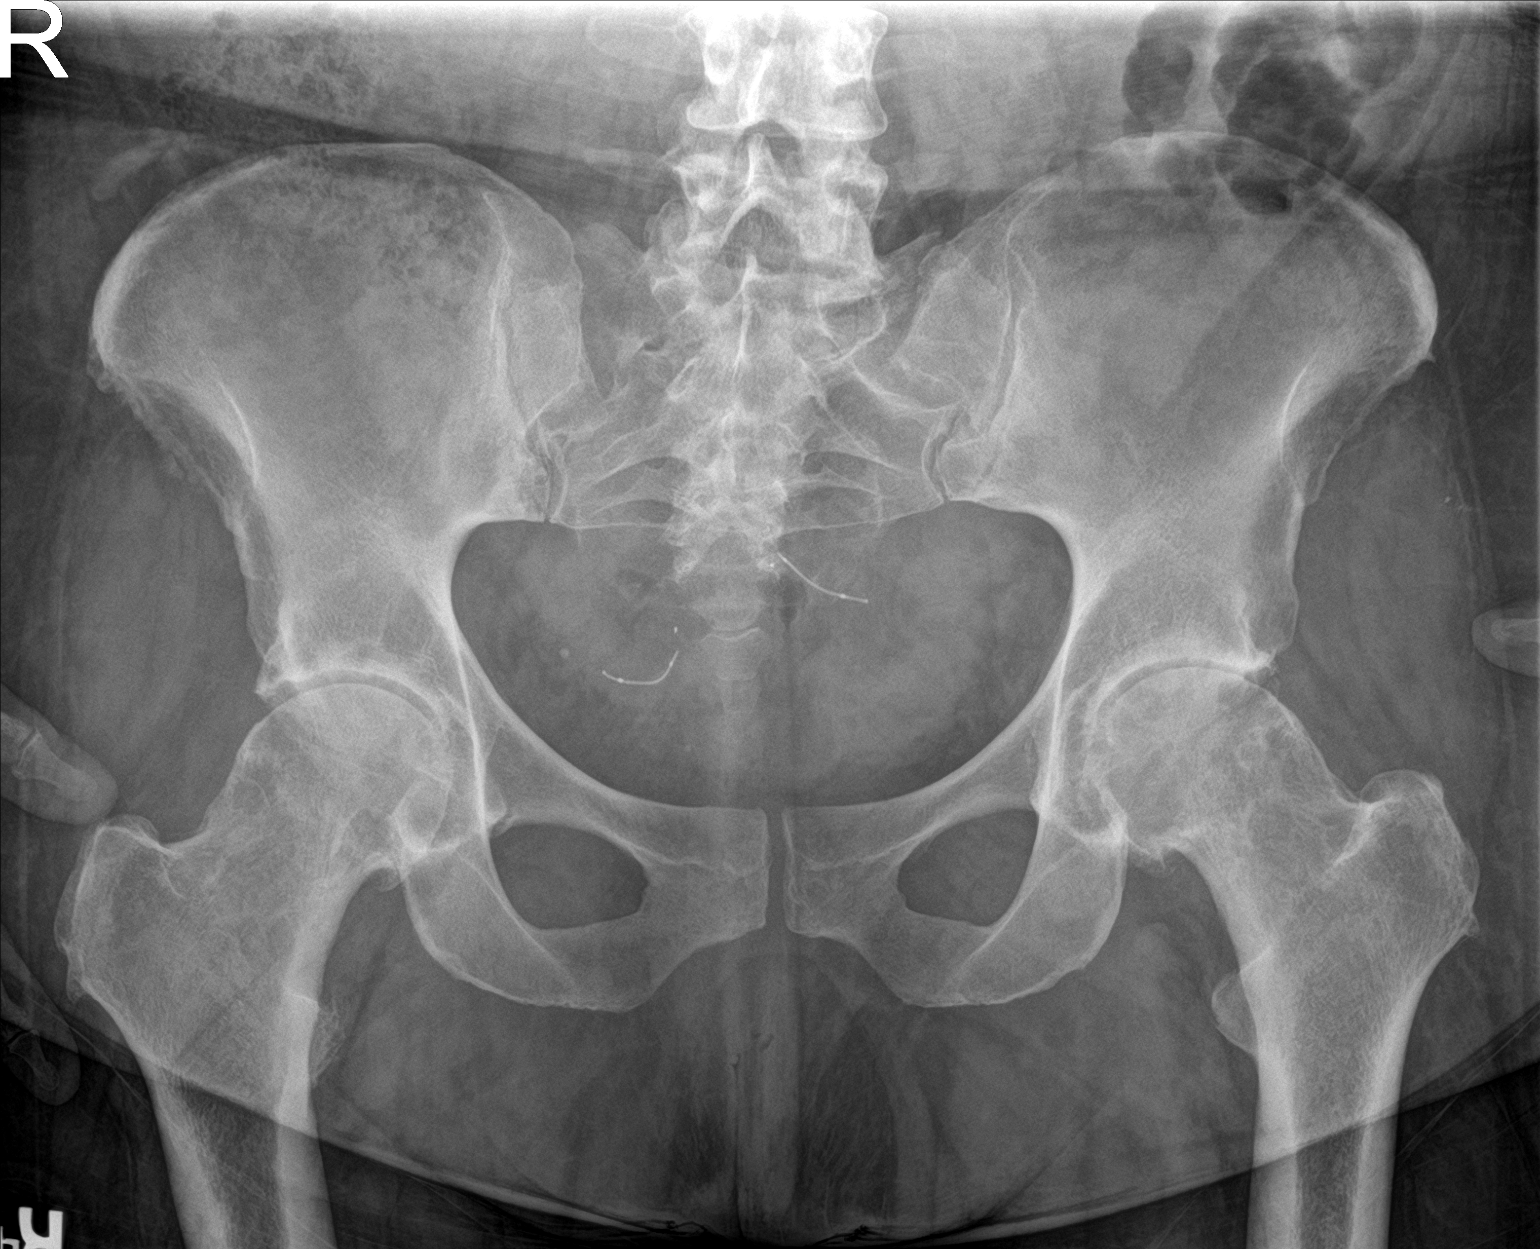

[hip ap]
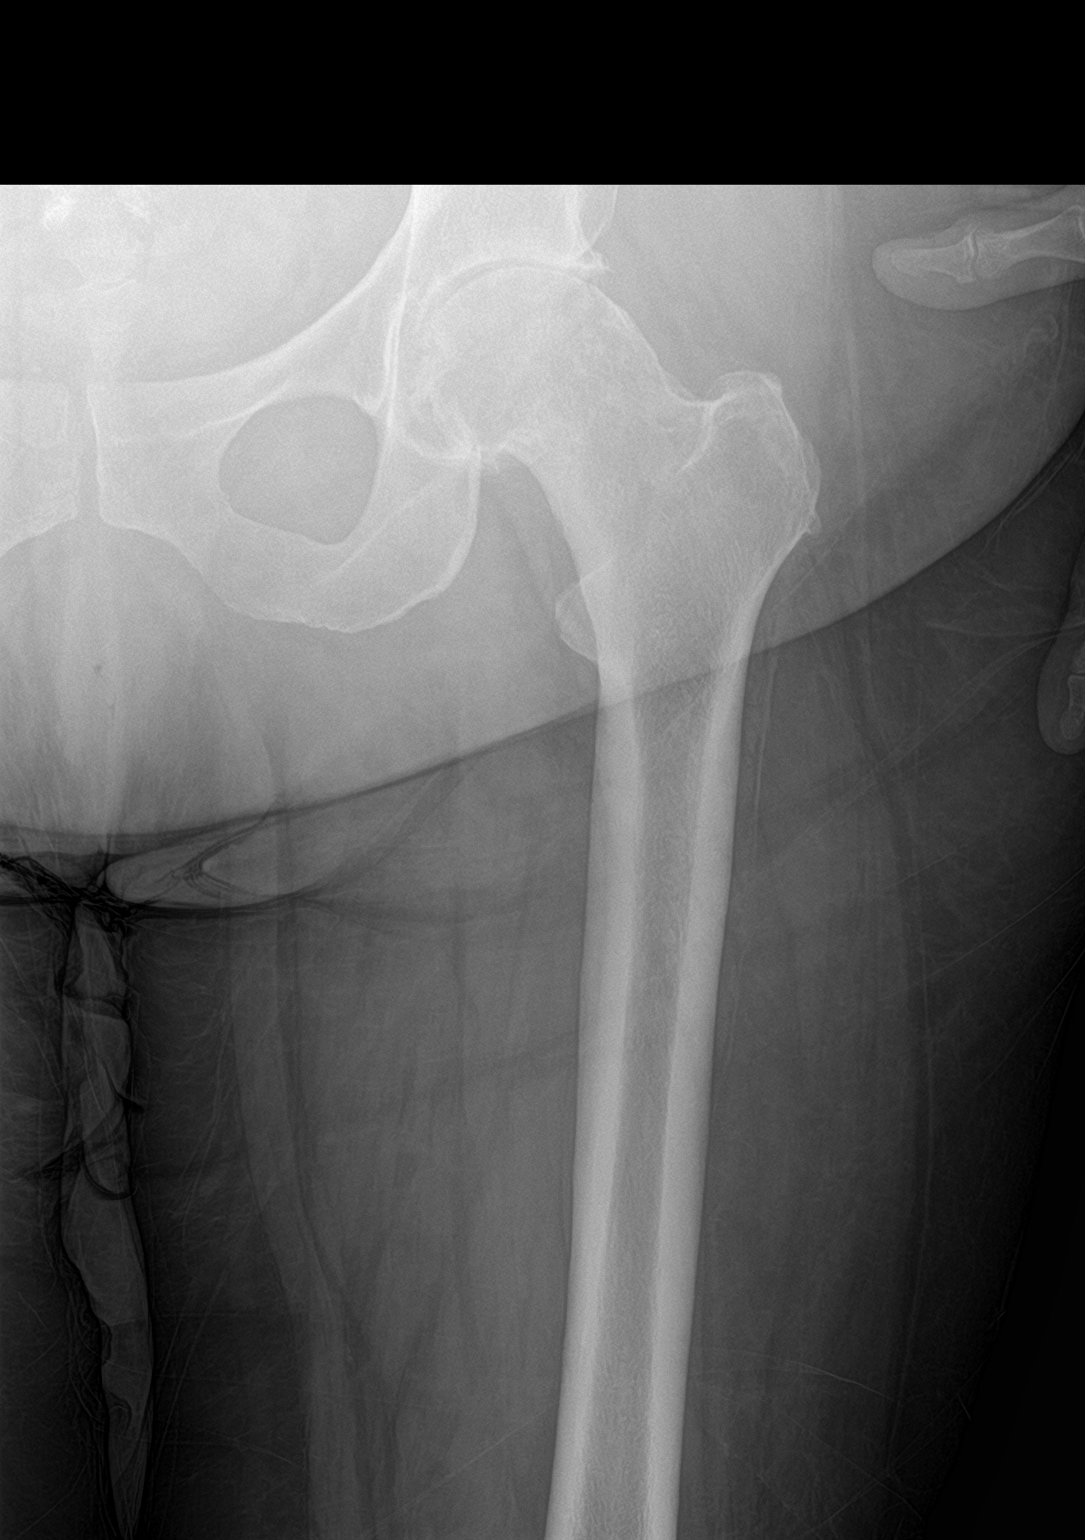

[hip lat]
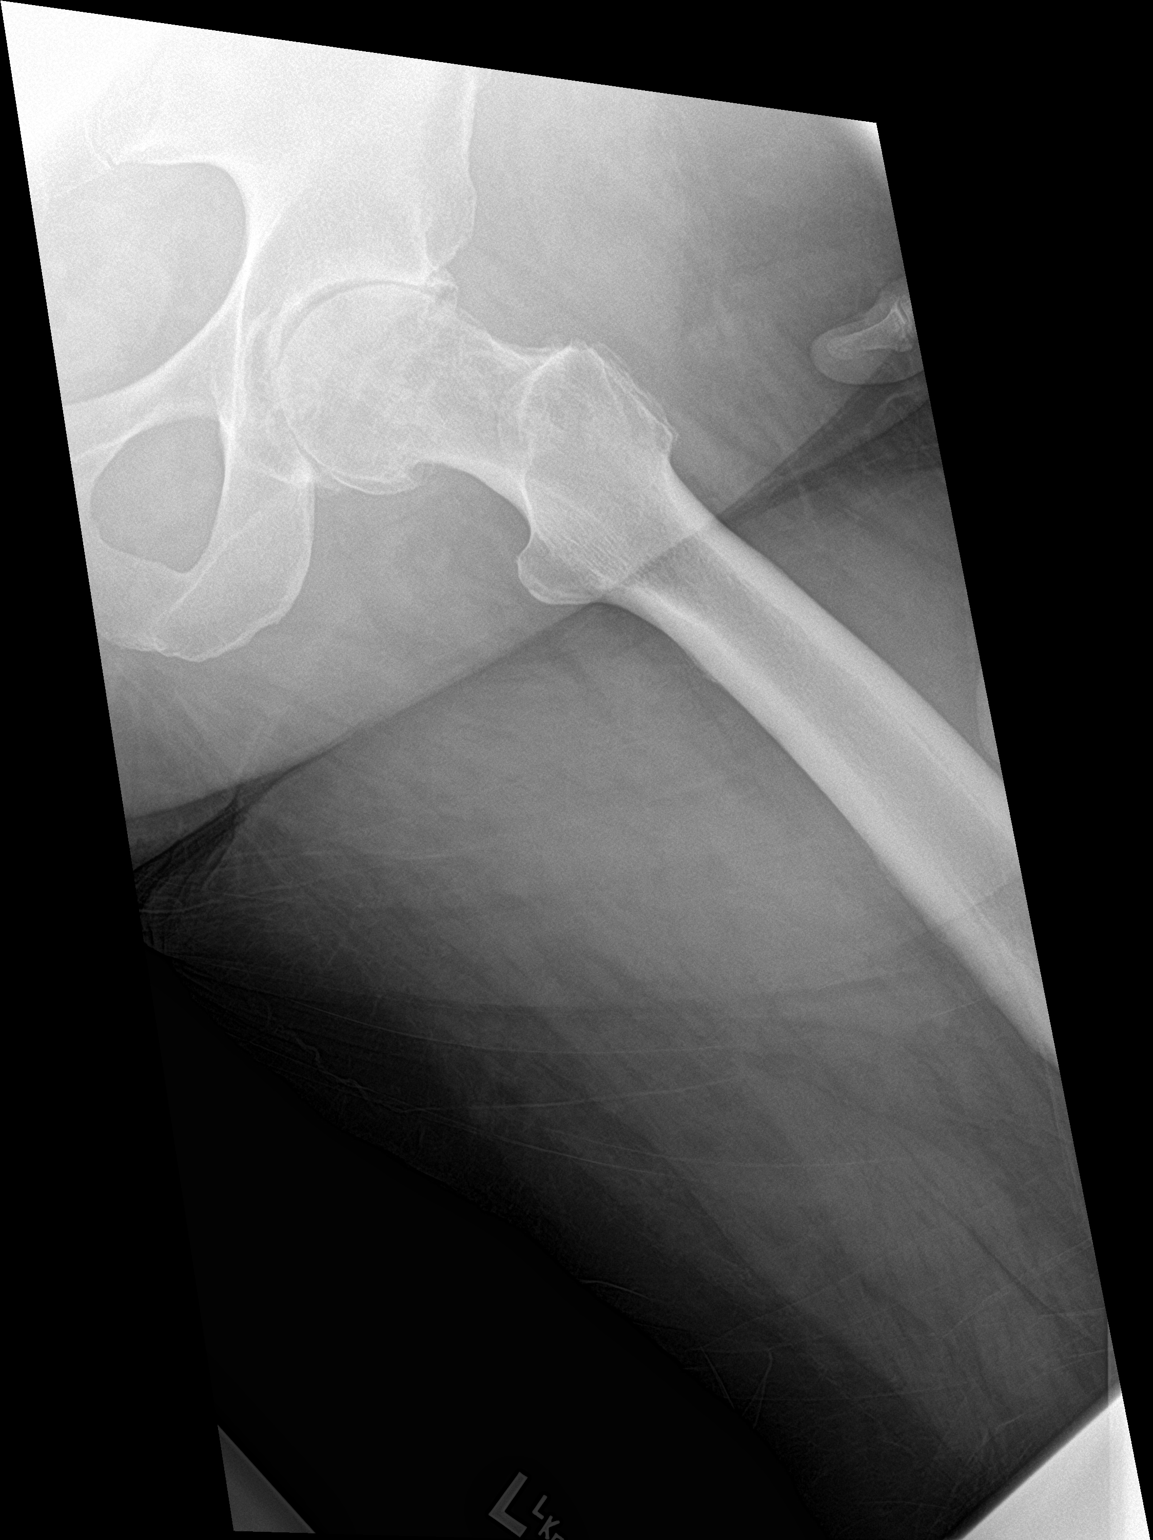

[3 of 3 positions shown; findings below may reference images not displayed]

FINDINGS: There is no evidence of hip fracture or dislocation. Moderate
narrowing of the left hip joint is noted in superior orientation.
Mild osteophyte formation is noted.
IMPRESSION: Moderate degenerative joint disease is noted. No acute abnormality
seen in the left hip.

## 2019-09-30 ENCOUNTER — Other Ambulatory Visit: Payer: Self-pay | Admitting: Nurse Practitioner

## 2019-09-30 NOTE — Telephone Encounter (Signed)
Requested medication (s) are due for refill today: yes  Requested medication (s) are on the active medication list: yes  Last refill:  06/02/2019  Future visit scheduled: no  Notes to clinic:  Refill cannot be delegated    Requested Prescriptions  Pending Prescriptions Disp Refills   cyclobenzaprine (FLEXERIL) 5 MG tablet [Pharmacy Med Name: CYCLOBENZAPRINE 5 MG TABLET] 30 tablet 0    Sig: Take 1 tablet (5 mg total) by mouth 2 (two) times daily as needed for muscle spasms.     Not Delegated - Analgesics:  Muscle Relaxants Failed - 09/30/2019  9:23 AM      Failed - This refill cannot be delegated      Passed - Valid encounter within last 6 months    Recent Outpatient Visits          2 weeks ago Tinea corporis   [place], Barbaraann Faster, NP   3 months ago Encounter for annual physical exam   [facility] [place], Henrine Screws T, NP   4 months ago Fever, unspecified fever cause   [place], [place], [place]   5 months ago BV (bacterial vaginosis)   [place], [place], DO   6 months ago Strep pharyngitis   [place], [place], [place]

## 2019-10-06 ENCOUNTER — Encounter: Payer: Self-pay | Admitting: Podiatry

## 2019-10-06 ENCOUNTER — Encounter: Payer: Self-pay | Admitting: Family Medicine

## 2019-10-06 DIAGNOSIS — Z9884 Bariatric surgery status: Secondary | ICD-10-CM | POA: Diagnosis not present

## 2019-10-06 DIAGNOSIS — Z96652 Presence of left artificial knee joint: Secondary | ICD-10-CM | POA: Diagnosis not present

## 2019-10-06 DIAGNOSIS — M86671 Other chronic osteomyelitis, right ankle and foot: Secondary | ICD-10-CM | POA: Diagnosis not present

## 2019-10-06 DIAGNOSIS — M4727 Other spondylosis with radiculopathy, lumbosacral region: Secondary | ICD-10-CM | POA: Diagnosis not present

## 2019-10-06 DIAGNOSIS — M4725 Other spondylosis with radiculopathy, thoracolumbar region: Secondary | ICD-10-CM | POA: Diagnosis not present

## 2019-10-06 DIAGNOSIS — M25552 Pain in left hip: Secondary | ICD-10-CM | POA: Diagnosis not present

## 2019-10-06 DIAGNOSIS — A4902 Methicillin resistant Staphylococcus aureus infection, unspecified site: Secondary | ICD-10-CM | POA: Diagnosis not present

## 2019-10-06 DIAGNOSIS — M25562 Pain in left knee: Secondary | ICD-10-CM | POA: Diagnosis not present

## 2019-10-08 ENCOUNTER — Encounter: Payer: Self-pay | Admitting: Family Medicine

## 2019-10-08 ENCOUNTER — Ambulatory Visit (INDEPENDENT_AMBULATORY_CARE_PROVIDER_SITE_OTHER): Payer: BC Managed Care – PPO | Admitting: Family Medicine

## 2019-10-08 ENCOUNTER — Other Ambulatory Visit: Payer: Self-pay

## 2019-10-08 VITALS — BP 146/94 | HR 58 | Temp 97.9°F | Wt 215.0 lb

## 2019-10-08 DIAGNOSIS — F419 Anxiety disorder, unspecified: Secondary | ICD-10-CM

## 2019-10-08 DIAGNOSIS — I10 Essential (primary) hypertension: Secondary | ICD-10-CM | POA: Diagnosis not present

## 2019-10-08 DIAGNOSIS — R002 Palpitations: Secondary | ICD-10-CM | POA: Diagnosis not present

## 2019-10-08 NOTE — Progress Notes (Signed)
BP (!) 146/94   Pulse (!) 58   Temp 97.9 F (36.6 C) (Oral)   Wt 215 lb (97.5 kg)   BMI 38.70 kg/m    Subjective:    Patient ID: Nancy Sanchez, female    DOB: 09/09/1971, 48 y.o.   MRN: [id number]  HPI: [name] is a 48 y.o. female  Chief Complaint  Patient presents with  . Anxiety    pt states that she has had a panic attack last Sunday, BP high and right eye blinking colors    . This visit was completed via WebEx due to the restrictions of the COVID-19 pandemic. All issues as above were discussed and addressed. Physical exam was done as above through visual confirmation on WebEx. If it was felt that the patient should be evaluated in the office, they were directed there. The patient verbally consented to this visit. . Location of the patient: home . Location of the provider: work . Those involved with this call:  . Provider: Roosvelt Maser, PA-C . CMA: Elton Sin, CMA . Front Desk/Registration: Harriet Pho  . Time spent on call: 15 minutes with patient face to face via video conference. More than 50% of this time was spent in counseling and coordination of care. 5 minutes total spent in review of patient's record and preparation of their chart. I verified patient identity using two factors (patient name and date of birth). Patient consents verbally to being seen via telemedicine visit today.   Had an episode on Sunday of palpitations, visual floaters, panic attack. Episode lasted around 3 hours. Took 2 buspar when this came on. BP was 156/94 and HR of 88. Notes she hadn't taken her medication that day. Overall feels her anxiety has gotten a lot better on her current regimen and states this panic attack was out of the ordinary for her. Has not had any issues since this episode. Denies SI/HI.   Depression screen [facility] 2/9 10/08/2019 06/13/2019 01/19/2019  Decreased Interest 3 1 2   Down, Depressed, Hopeless 0 0 3  PHQ - 2 Score 3 1 5   Altered sleeping 2 1 2     Tired, decreased energy 2 1 2   Change in appetite 2 0 0  Feeling bad or failure about yourself  0 0 0  Trouble concentrating 3 1 3   Moving slowly or fidgety/restless 0 0 0  Suicidal thoughts 0 0 1  PHQ-9 Score 12 4 13   Difficult doing work/chores Not difficult at all Not difficult at all Very difficult   GAD 7 : Generalized Anxiety Score 10/08/2019 06/13/2019 01/19/2019 08/14/2018  Nervous, Anxious, on Edge 0 1 1 3   Control/stop worrying 1 0 2 3  Worry too much - different things 0 0 2 3  Trouble relaxing 1 1 1 3   Restless 0 1 0 2  Easily annoyed or irritable 1 0 0 2  Afraid - awful might happen 0 0 1 3  Total GAD 7 Score 3 3 7 19   Anxiety Difficulty Not difficult at all Somewhat difficult Somewhat difficult -   Relevant past medical, surgical, family and social history reviewed and updated as indicated. Interim medical history since our last visit reviewed. Allergies and medications reviewed and updated.  Review of Systems  Per HPI unless specifically indicated above     Objective:    BP (!) 146/94   Pulse (!) 58   Temp 97.9 F (36.6 C) (Oral)   Wt 215 lb (97.5 kg)   BMI  38.70 kg/m   Wt Readings from Last 3 Encounters:  10/08/19 215 lb (97.5 kg)  06/13/19 205 lb 6.4 oz (93.2 kg)  05/28/19 212 lb (96.2 kg)    Physical Exam Vitals and nursing note reviewed.  Constitutional:      General: She is not in acute distress.    Appearance: Normal appearance.  HENT:     Head: Atraumatic.     Right Ear: External ear normal.     Left Ear: External ear normal.     Nose: Nose normal. No congestion.     Mouth/Throat:     Mouth: Mucous membranes are moist.     Pharynx: Oropharynx is clear. No posterior oropharyngeal erythema.  Eyes:     Extraocular Movements: Extraocular movements intact.     Conjunctiva/sclera: Conjunctivae normal.  Cardiovascular:     Comments: Unable to assess via virtual visit Pulmonary:     Effort: Pulmonary effort is normal. No respiratory distress.   Musculoskeletal:        General: Normal range of motion.     Cervical back: Normal range of motion.  Skin:    General: Skin is dry.     Findings: No erythema.  Neurological:     Mental Status: She is alert and oriented to person, place, and time.  Psychiatric:        Mood and Affect: Mood normal.        Thought Content: Thought content normal.        Judgment: Judgment normal.     Results for orders placed or performed in visit on 09/11/19  Ferritin  Result Value Ref Range   Ferritin 43 15 - 150 ng/mL  CBC with Differential/Platelet out  Result Value Ref Range   WBC 6.4 3.4 - 10.8 x10E3/uL   RBC 4.42 3.77 - 5.28 x10E6/uL   Hemoglobin 13.7 11.1 - 15.9 g/dL   Hematocrit 56.440.3 33.234.0 - 46.6 %   MCV 91 79 - 97 fL   MCH 31.0 26.6 - 33.0 pg   MCHC 34.0 31.5 - 35.7 g/dL   RDW 95.111.9 88.411.7 - 16.615.4 %   Platelets 303 150 - 450 x10E3/uL   Neutrophils 67 Not Estab. %   Lymphs 23 Not Estab. %   Monocytes 7 Not Estab. %   Eos 2 Not Estab. %   Basos 1 Not Estab. %   Neutrophils Absolute 4.3 1.4 - 7.0 x10E3/uL   Lymphocytes Absolute 1.5 0.7 - 3.1 x10E3/uL   Monocytes Absolute 0.5 0.1 - 0.9 x10E3/uL   EOS (ABSOLUTE) 0.1 0.0 - 0.4 x10E3/uL   Basophils Absolute 0.0 0.0 - 0.2 x10E3/uL   Immature Granulocytes 0 Not Estab. %   Immature Grans (Abs) 0.0 0.0 - 0.1 x10E3/uL      Assessment & Plan:   Problem List Items Addressed This Visit      Cardiovascular and Mediastinum   Hypertension - Primary    Continue to monitor closely at home, has had two isolated episodes of elevated BPs but states typically low normal. WIll not increase medication at this time. F/u if persistent abnormal readings        Other   Anxiety    Isolated episode of panic, but overall states her anxiety is under god control. Will continue current regimen with close monitoring. F/u if continuing to have panic episodes      Heart palpitations    Associated with panic episode and not having taken her BP medications yet  that day. No sxs  since episode, HRs stable and WNL when checking at home. F/u if abnormal readings or further sxs          Follow up plan: Return for as scheduled.

## 2019-10-10 ENCOUNTER — Encounter: Payer: Self-pay | Admitting: Family Medicine

## 2019-10-11 DIAGNOSIS — M5442 Lumbago with sciatica, left side: Secondary | ICD-10-CM | POA: Diagnosis not present

## 2019-10-11 DIAGNOSIS — M5441 Lumbago with sciatica, right side: Secondary | ICD-10-CM | POA: Diagnosis not present

## 2019-10-11 DIAGNOSIS — G8929 Other chronic pain: Secondary | ICD-10-CM | POA: Diagnosis not present

## 2019-10-11 DIAGNOSIS — M5117 Intervertebral disc disorders with radiculopathy, lumbosacral region: Secondary | ICD-10-CM | POA: Diagnosis not present

## 2019-10-11 DIAGNOSIS — M5116 Intervertebral disc disorders with radiculopathy, lumbar region: Secondary | ICD-10-CM | POA: Diagnosis not present

## 2019-10-11 DIAGNOSIS — M9973 Connective tissue and disc stenosis of intervertebral foramina of lumbar region: Secondary | ICD-10-CM | POA: Diagnosis not present

## 2019-10-12 NOTE — Assessment & Plan Note (Signed)
Isolated episode of panic, but overall states her anxiety is under god control. Will continue current regimen with close monitoring. F/u if continuing to have panic episodes

## 2019-10-12 NOTE — Assessment & Plan Note (Signed)
Continue to monitor closely at home, has had two isolated episodes of elevated BPs but states typically low normal. WIll not increase medication at this time. F/u if persistent abnormal readings

## 2019-10-12 NOTE — Assessment & Plan Note (Signed)
Associated with panic episode and not having taken her BP medications yet that day. No sxs since episode, HRs stable and WNL when checking at home. F/u if abnormal readings or further sxs

## 2019-10-15 ENCOUNTER — Other Ambulatory Visit: Payer: Self-pay | Admitting: Family Medicine

## 2019-10-15 DIAGNOSIS — R2681 Unsteadiness on feet: Secondary | ICD-10-CM

## 2019-10-15 DIAGNOSIS — R296 Repeated falls: Secondary | ICD-10-CM

## 2019-11-11 ENCOUNTER — Other Ambulatory Visit: Payer: Self-pay

## 2019-11-11 ENCOUNTER — Ambulatory Visit: Payer: BC Managed Care – PPO | Admitting: Podiatry

## 2019-11-11 ENCOUNTER — Other Ambulatory Visit: Payer: Self-pay | Admitting: Nurse Practitioner

## 2019-11-11 DIAGNOSIS — M25571 Pain in right ankle and joints of right foot: Secondary | ICD-10-CM

## 2019-11-11 DIAGNOSIS — M25371 Other instability, right ankle: Secondary | ICD-10-CM

## 2019-11-11 DIAGNOSIS — G8929 Other chronic pain: Secondary | ICD-10-CM | POA: Diagnosis not present

## 2019-11-11 NOTE — Telephone Encounter (Signed)
Requested Prescriptions  Pending Prescriptions Disp Refills  . propranolol (INDERAL) 20 MG tablet [Pharmacy Med Name: PROPRANOLOL 20 MG TABLET] 120 tablet 0    Sig: Take 2 tablets (40 mg total) by mouth 2 (two) times daily.     Cardiovascular:  Beta Blockers Failed - 11/11/2019 10:44 AM      Failed - Last BP in normal range    BP Readings from Last 1 Encounters:  10/08/19 (!) 146/94         Passed - Last Heart Rate in normal range    Pulse Readings from Last 1 Encounters:  10/08/19 (!) 58         Passed - Valid encounter within last 6 months    Recent Outpatient Visits          1 month ago Essential hypertension   [facility] [place], [place], [place]   1 month ago Tinea corporis   [facility] [place], [place] T, NP   5 months ago Encounter for annual physical exam   [facility] [place], [place], [place]   5 months ago Fever, unspecified fever cause   [facility] Roosvelt Maser [place], [place]   6 months ago BV (bacterial vaginosis)   [facility] Dorcas Carrow, DO      Future Appointments            In 2 days Wells Guiles, NP [facility]

## 2019-11-12 ENCOUNTER — Encounter: Payer: Self-pay | Admitting: Podiatry

## 2019-11-12 NOTE — Progress Notes (Signed)
Return in about 1 week (around 11/18/2019).  Subjective:  Patient ID: Nancy Sanchez, female    DOB: 1971-07-24,  MRN: [id number]  Chief Complaint  Patient presents with  . Ankle Pain    pt is here for a f/u of the right ankle on, pt states that the right ankle continues to roll when not using a trilock brace, pt is looking to find a way to build her ankle strength as well.    49 y.o. female presents with the above complaint.  Patient presents with a follow-up of a new complaint to the right ankle.  Patient states that this ankle has been giving her a lot of problem and has essentially gives out when she is walking.  She did this has caused her to fall multiple times.  This has been going on since December.  Patient is looking to find a way to build up her ankle muscles.  Patient states that the brace has been helping some that she had gone from an outside source however the pain is still elevated when mom not wearing the brace.  Patient states her right ankle will roll without a brace.  Patient wants to know if there is an alternative method for her ankle movement.  She denies any other acute complaints.  She denies being treated for this previously.  Review of Systems: Negative except as noted in the HPI. Denies N/V/F/Ch.  Past Medical History:  Diagnosis Date  . ADHD   . Allergy   . Anemia   . Anxiety   . Arthritis   . Asthma   . Barrett's esophagus   . Bulging lumbar disc    L5-S1  . Depression   . Endometriosis   . GERD (gastroesophageal reflux disease)   . Hernia, hiatal    DDD  . HPV in female   . Hypertension   . Migraine   . Osteoporosis     Current Outpatient Medications:  .  acetaminophen (TYLENOL) 650 MG CR tablet, Take by mouth., Disp: , Rfl:  .  albuterol (PROVENTIL) (2.5 MG/3ML) 0.083% nebulizer solution, Take 3 mLs (2.5 mg total) by nebulization every 6 (six) hours as needed for wheezing or shortness of breath., Disp: 150 mL, Rfl: 1 .  albuterol (VENTOLIN  HFA) 108 (90 Base) MCG/ACT inhaler, 1-2 puffs by inhalation 4 times a day when needed, Disp: 18 g, Rfl: 3 .  BIOTIN PO, Take by mouth., Disp: , Rfl:  .  budesonide-formoterol (SYMBICORT) 160-4.5 MCG/ACT inhaler, Inhale 2 puffs into the lungs 2 (two) times daily., Disp: 10.2 g, Rfl: 3 .  buPROPion (WELLBUTRIN XL) 300 MG 24 hr tablet, Take 1 tablet (300 mg total) by mouth daily., Disp: 90 tablet, Rfl: 3 .  busPIRone (BUSPAR) 5 MG tablet, Take 1 tablet (5 mg total) by mouth as needed (Take twice a day as needed for increased anxiety.)., Disp: 90 tablet, Rfl: 1 .  Cholecalciferol (D3-50) 50000 units capsule, Take by mouth., Disp: , Rfl:  .  cyanocobalamin (,VITAMIN B-12,) 1000 MCG/ML injection, cyanocobalamin (vit B-12) 1,000 mcg/mL injection solution, Disp: , Rfl:  .  cyclobenzaprine (FLEXERIL) 5 MG tablet, Take 1 tablet (5 mg total) by mouth 2 (two) times daily as needed for muscle spasms., Disp: 30 tablet, Rfl: 0 .  ergocalciferol (VITAMIN D2) 1.25 MG (50000 UT) capsule, Vitamin D2 1,250 mcg (50,000 unit) capsule  Take 1 capsule every week by oral route., Disp: , Rfl:  .  escitalopram (LEXAPRO) 20 MG tablet, Take 1  tablet (20 mg total) by mouth daily., Disp: 90 tablet, Rfl: 1 .  ferrous sulfate 324 (65 Fe) MG TBEC, Take by mouth daily., Disp: , Rfl:  .  fexofenadine (ALLEGRA) 180 MG tablet, Take 1 tablet (180 mg total) by mouth daily., Disp: 90 tablet, Rfl: 2 .  fluticasone (FLONASE) 50 MCG/ACT nasal spray, Place into the nose., Disp: , Rfl:  .  gabapentin (NEURONTIN) 300 MG capsule, Take 2 capsules (600 mg total) by mouth 2 (two) times daily., Disp: 360 capsule, Rfl: 1 .  magnesium oxide (MAG-OX) 400 MG tablet, Take 400 mg by mouth 2 (two) times daily., Disp: , Rfl:  .  meclizine (ANTIVERT) 25 MG tablet, , Disp: , Rfl:  .  montelukast (SINGULAIR) 10 MG tablet, Take 1 tablet (10 mg total) by mouth daily., Disp: 90 tablet, Rfl: 1 .  nystatin (MYCOSTATIN/NYSTOP) powder, Apply topically 3 (three) times  daily., Disp: 15 g, Rfl: 4 .  Omega-3 1000 MG CAPS, Take by mouth., Disp: , Rfl:  .  omeprazole (PRILOSEC) 40 MG capsule, Take 1 capsule (40 mg total) by mouth 2 (two) times daily before a meal., Disp: 60 capsule, Rfl: 4 .  ondansetron (ZOFRAN ODT) 4 MG disintegrating tablet, Take 1 tablet (4 mg total) by mouth every 8 (eight) hours as needed for nausea or vomiting., Disp: 20 tablet, Rfl: 0 .  propranolol (INDERAL) 20 MG tablet, Take 2 tablets (40 mg total) by mouth 2 (two) times daily., Disp: 120 tablet, Rfl: 0 .  rizatriptan (MAXALT) 10 MG tablet, Take 1 tablet (10 mg total) by mouth as needed for migraine. May repeat after 2 hours if not better. No more than 2 pills in 24 hours, Disp: 10 tablet, Rfl: 6 .  TURMERIC PO, Take by mouth., Disp: , Rfl:   Social History   Tobacco Use  Smoking Status Former Smoker  . Quit date: 10/23/1998  . Years since quitting: 21.0  Smokeless Tobacco Never Used    Allergies  Allergen Reactions  . Metronidazole Hives, Itching and Rash  . Oxycodone-Acetaminophen Nausea And Vomiting    Other reaction(s): Nausea And Vomiting nausea nausea  . Prednisone Anxiety    Other reaction(s): Other (See Comments) "agitated"  . Tape Rash  . Aspirin     Unable to take aspirin products since [facility] Gastric Bypass  . Codeine     Other reaction(s): Other (See Comments)  . Nickel Itching    Itching from earrings -unsure of composition  . Nsaids Other (See Comments)    Gastric bypass  . Other Itching    Itching from earrings -unsure of composition  . Oxycodone Other (See Comments)    Other reaction(s): Vomiting  . Fluticasone-Salmeterol Anxiety    Other Reaction: jittery Other Reaction: jittery Other Reaction: jittery Other Reaction: jittery Other Reaction: jittery  . Latex Rash    Other Reaction: Rash on hands only Other Reaction: Rash on hands only  . Morphine Anxiety and Itching    Does well with dilaudid and toradol Does well with dilaudid and toradol    Objective:  There were no vitals filed for this visit. There is no height or weight on file to calculate BMI. Constitutional Well developed. Well nourished.  Vascular Dorsalis pedis pulses palpable bilaterally. Posterior tibial pulses palpable bilaterally. Capillary refill normal to all digits.  No cyanosis or clubbing noted. Pedal hair growth normal.  Neurologic Normal speech. Oriented to person, place, and time. Epicritic sensation to light touch grossly present bilaterally.  Dermatologic  no pain to the right lateral hallux nail border. No other open wounds. No skin lesions.  Orthopedic:  No pain on palpation to the right heel. Pain on palpation to the right lateral ATFL region.  There is mild ligament laxity noted when undergoing inversion at the ankle joint.  No talar tilt or positive anterior drawer sign.  No pain on dorsiflexion plantarflexion or eversion of the foot.   Radiographs: None Assessment:   1. Ankle instability, right   2. Chronic pain of right ankle    Plan:  Patient was evaluated and treated and all questions answered.  Right chronic ankle instability -I explained to the patient the etiology of chronic ankle instability especially in setting of multiple history of ankle sprains in the past and various treatment options associated with it.  I discussed with the patient that she may benefit from a different type of brace that can give more support to the ankle joint.  However long-term management of this may require surgical intervention to help prevent the ankle from rolling out/causing instability and therefore causing her to fall.  If her instability is not managed with the bracing alone I will consider discussing further surgical intervention to help address this chronic ankle instability with possible Arthrex internal brace. -Ankle brace was dispensed for the right foot  -Patient will also follow-up with Rick for adjustments to her custom-made orthotics.   Overall she is very happy with the orthotics.  However she would like to know if there is a wedge that Raiford Noble could place to the outside or lateral aspect of the foot to prevent the ankle from rolling in and therefore causing her to fall.   Ingrown Nail, right -Resolved  Plantar Fasciitis, right -Resolved.  Bilateral pes planus -Patient picked up her orthotics for pes planus deformity.  I believe this will help her control the plantar fascia as well as long-term management.   Return in about 1 week (around 11/18/2019).

## 2019-11-13 ENCOUNTER — Encounter: Payer: Self-pay | Admitting: Nurse Practitioner

## 2019-11-13 ENCOUNTER — Other Ambulatory Visit: Payer: Self-pay

## 2019-11-13 ENCOUNTER — Ambulatory Visit: Payer: BC Managed Care – PPO | Attending: Family Medicine | Admitting: Physical Therapy

## 2019-11-13 ENCOUNTER — Ambulatory Visit (INDEPENDENT_AMBULATORY_CARE_PROVIDER_SITE_OTHER): Payer: BC Managed Care – PPO | Admitting: Nurse Practitioner

## 2019-11-13 VITALS — BP 132/85 | HR 64 | Temp 98.4°F | Ht 62.0 in | Wt 220.0 lb

## 2019-11-13 DIAGNOSIS — N898 Other specified noninflammatory disorders of vagina: Secondary | ICD-10-CM | POA: Insufficient documentation

## 2019-11-13 DIAGNOSIS — N939 Abnormal uterine and vaginal bleeding, unspecified: Secondary | ICD-10-CM

## 2019-11-13 DIAGNOSIS — M25371 Other instability, right ankle: Secondary | ICD-10-CM | POA: Insufficient documentation

## 2019-11-13 DIAGNOSIS — M6281 Muscle weakness (generalized): Secondary | ICD-10-CM

## 2019-11-13 DIAGNOSIS — R269 Unspecified abnormalities of gait and mobility: Secondary | ICD-10-CM | POA: Diagnosis not present

## 2019-11-13 DIAGNOSIS — I10 Essential (primary) hypertension: Secondary | ICD-10-CM

## 2019-11-13 DIAGNOSIS — M25552 Pain in left hip: Secondary | ICD-10-CM | POA: Diagnosis not present

## 2019-11-13 DIAGNOSIS — D508 Other iron deficiency anemias: Secondary | ICD-10-CM

## 2019-11-13 LAB — WET PREP FOR TRICH, YEAST, CLUE
Clue Cell Exam: POSITIVE — AB
Trichomonas Exam: NEGATIVE
Yeast Exam: NEGATIVE

## 2019-11-13 MED ORDER — CLINDAMYCIN PHOSPHATE 2 % VA CREA: 1.0000 | TOPICAL_CREAM | Freq: Every day | VAGINAL | 0 refills | Status: DC

## 2019-11-13 NOTE — Assessment & Plan Note (Signed)
Patient with concerns about BP.  BP normal today in the office, discussed lifestyle changes with the patient.  Given isolated events of high blood pressure when patient is feeling anxious, will not adjust medication at this time.  Patient advised to keep BP log at home and monitor at regular times each day and bring log to next regular visit.

## 2019-11-13 NOTE — Patient Instructions (Signed)

## 2019-11-13 NOTE — Patient Instructions (Signed)
Access Code: [email or web address]  URL: [email or web address]  Date: 11/13/2019  Prepared by: Dorene Grebe   Exercises  Long Sitting Ankle Inversion with Anchored Resistance - 25 reps - 2 sets - 2 hold - 1x daily - 7x weekly  Long Sitting Ankle Eversion with Resistance - 25 reps - 2 sets - 2 hold - 1x daily - 7x weekly  Standing Heel Raise with Chair Support - 10 reps - 2 sets - 1x daily - 7x weekly  Sidestepping in Squat with Resistance and Arms Forward - 10 reps - 2 sets - 1x daily - 7x weekly

## 2019-11-13 NOTE — Assessment & Plan Note (Signed)
2.5 weeks of light vaginal bleeding is quite different from patient's normal menses. With symptoms like hot flashes and trouble sleeping, ddx includes perimenopause. Will check CBC today to be sure not anemic with bleeding. Patient advised to return to clinic if bleeding persists >4 weeks or worsens; GYN referral may be warranted.

## 2019-11-13 NOTE — Progress Notes (Signed)
BP 132/85 (BP Location: Left Arm, Patient Position: Sitting, Cuff Size: Normal)   Pulse 64   Temp 98.4 F (36.9 C) (Oral)   SpO2 100%    Subjective:    Patient ID: Nancy Sanchez, female    DOB: [birth year], 49 y.o.   MRN: [id number]  HPI: Nancy Sanchez is a 49 y.o. female  Chief Complaint  Patient presents with  . Vaginal Bleeding  . Abdominal Cramping  . Vaginal Irritation   VAGINAL IRRIATION Duration: days Discharge description: none  Pruritus: yes Dysuria: no Malodorous: no Urinary frequency: no Fevers: no Abdominal pain: yes , lower Sexual activity: monogamous History of sexually transmitted diseases: history of BV Recent antibiotic use: no Context:stable  Treatments attempted: none   MENOPAUSAL SYMPTOMS Bleeding x 2.5 weeks, comes and goes Typical periods are heavy with clots, x 4-5 days Gravida/Para: 1, 28 years Duration: stable Symptom severity: mild Hot flashes: yes Night sweats: no Sleep disturbances: yes Vaginal dryness: no  Sexually active: yes Dyspareunia:no Decreased libido: no Stress incontinence: no Previous HRT/pharmacotherapy: yes, OCP, Depo, Nuva ring, IUD, ablation Hysterectomy: no Average interval between menses: 28 days Length of menses: 4-5 days Flow: heavy days 2-3, then light Dysmenorrhea: no GYN surgery:  Absolute Contraindications to Hormonal Therapy:     Undiagnosed vaginal bleeding: no    Breast cancer: no    Endometrial cancer: no    Coronary disease: no    Cerebrovascular disease: no    Venous thromboembolic disease: no   Patient also reports concern about her blood pressure, reports that her baseline BP is 110s/70s, and does not monitor routinely at home.  Reports isolated blood pressure during panic attacks or when feeling anxious.    Allergies  Allergen Reactions  . Metronidazole Hives, Itching and Rash  . Oxycodone-Acetaminophen Nausea And Vomiting    Other reaction(s): Nausea And  Vomiting nausea nausea  . Prednisone Anxiety    Other reaction(s): Other (See Comments) "agitated"  . Tape Rash  . Aspirin     Unable to take aspirin products since [facility] Gastric Bypass  . Codeine     Other reaction(s): Other (See Comments)  . Nickel Itching    Itching from earrings -unsure of composition  . Nsaids Other (See Comments)    Gastric bypass  . Other Itching    Itching from earrings -unsure of composition  . Oxycodone Other (See Comments)    Other reaction(s): Vomiting  . Fluticasone-Salmeterol Anxiety    Other Reaction: jittery Other Reaction: jittery Other Reaction: jittery Other Reaction: jittery Other Reaction: jittery  . Latex Rash    Other Reaction: Rash on hands only Other Reaction: Rash on hands only  . Morphine Anxiety and Itching    Does well with dilaudid and toradol Does well with dilaudid and toradol    Outpatient Encounter Medications as of 11/13/2019  Medication Sig Note  . acetaminophen (TYLENOL) 650 MG CR tablet Take by mouth. 03/14/2019: As needed  . albuterol (PROVENTIL) (2.5 MG/3ML) 0.083% nebulizer solution Take 3 mLs (2.5 mg total) by nebulization every 6 (six) hours as needed for wheezing or shortness of breath.   Marland Kitchen albuterol (VENTOLIN HFA) 108 (90 Base) MCG/ACT inhaler 1-2 puffs by inhalation 4 times a day when needed   . BIOTIN PO Take by mouth.   . budesonide-formoterol (SYMBICORT) 160-4.5 MCG/ACT inhaler Inhale 2 puffs into the lungs 2 (two) times daily.   Marland Kitchen buPROPion (WELLBUTRIN XL) 300 MG 24 hr tablet Take 1 tablet (300 mg total)  by mouth daily.   . busPIRone (BUSPAR) 5 MG tablet Take 1 tablet (5 mg total) by mouth as needed (Take twice a day as needed for increased anxiety.).   Marland Kitchen Cholecalciferol (D3-50) 50000 units capsule Take by mouth.   . cyclobenzaprine (FLEXERIL) 5 MG tablet Take 1 tablet (5 mg total) by mouth 2 (two) times daily as needed for muscle spasms.   . ergocalciferol (VITAMIN D2) 1.25 MG (50000 UT) capsule Vitamin D2  1,250 mcg (50,000 unit) capsule  Take 1 capsule every week by oral route.   Marland Kitchen escitalopram (LEXAPRO) 20 MG tablet Take 1 tablet (20 mg total) by mouth daily.   . ferrous sulfate 324 (65 Fe) MG TBEC Take by mouth daily.   . fexofenadine (ALLEGRA) 180 MG tablet Take 1 tablet (180 mg total) by mouth daily.   . fluticasone (FLONASE) 50 MCG/ACT nasal spray Place into the nose.   . gabapentin (NEURONTIN) 300 MG capsule Take 2 capsules (600 mg total) by mouth 2 (two) times daily.   . magnesium oxide (MAG-OX) 400 MG tablet Take 400 mg by mouth 2 (two) times daily.   . meclizine (ANTIVERT) 25 MG tablet    . montelukast (SINGULAIR) 10 MG tablet Take 1 tablet (10 mg total) by mouth daily.   Marland Kitchen nystatin (MYCOSTATIN/NYSTOP) powder Apply topically 3 (three) times daily.   . Omega-3 1000 MG CAPS Take by mouth.   Marland Kitchen omeprazole (PRILOSEC) 40 MG capsule Take 1 capsule (40 mg total) by mouth 2 (two) times daily before a meal.   . ondansetron (ZOFRAN ODT) 4 MG disintegrating tablet Take 1 tablet (4 mg total) by mouth every 8 (eight) hours as needed for nausea or vomiting.   . propranolol (INDERAL) 20 MG tablet Take 2 tablets (40 mg total) by mouth 2 (two) times daily.   . rizatriptan (MAXALT) 10 MG tablet Take 1 tablet (10 mg total) by mouth as needed for migraine. May repeat after 2 hours if not better. No more than 2 pills in 24 hours   . TURMERIC PO Take by mouth.   . [DISCONTINUED] cyanocobalamin (,VITAMIN B-12,) 1000 MCG/ML injection cyanocobalamin (vit B-12) 1,000 mcg/mL injection solution    No facility-administered encounter medications on file as of 11/13/2019.    Patient Active Problem List   Diagnosis Date Noted  . Vaginal irritation 11/13/2019  . Tinea corporis 09/16/2019  . At risk for sepsis 05/29/2019  . Impingement syndrome of right shoulder 05/04/2019  . Heart palpitations 04/17/2019  . History of PSVT (paroxysmal supraventricular tachycardia) 04/17/2019  . Degenerative tear of medial  meniscus of right knee 04/08/2019  . Knee instability, right 04/07/2019  . Morbid obesity (HCC) 01/19/2019  . Sprain of calcaneofibular ligament of right ankle 12/12/2018  . Rotator cuff injury, initial encounter 12/10/2018  . S/P total knee arthroplasty, left 09/30/2018  . Dysuria 09/24/2018  . Abnormal uterine bleeding 05/16/2018  . Headache 05/16/2018  . Menorrhagia 05/16/2018  . Barrett's esophagus with dysplasia 04/07/2018  . Vitamin D deficiency 03/08/2018  . Vaginal low risk HPV DNA test positive 01/26/2018  . Left inguinal hernia 11/14/2017  . Posterior tibial tendon dysfunction (PTTD) of right lower extremity 11/14/2017  . Infection of skin due to methicillin resistant Staphylococcus aureus (MRSA) 11/09/2017  . Abscess of abdominal wall 11/06/2017  . Vaginal yeast infection 09/21/2017  . Abscess of right foot 07/18/2017  . Mild episode of recurrent major depressive disorder (HCC) 06/27/2017  . Hidradenitis axillaris 05/28/2017  . History of Roux-en-Y gastric  bypass 05/01/2017  . Migraines 05/01/2017  . Anxiety   . Arthritis   . Asthma   . Depression   . GERD (gastroesophageal reflux disease)   . Hypertension   . Osteoporosis   . Iron deficiency anemia 02/27/2017  . Paroxysmal supraventricular tachycardia (HCC) 02/27/2017  . Generalized abdominal pain 04/19/2016  . Seasonal allergic rhinitis 01/04/2016  . Primary osteoarthritis of left hip 01/29/2015  . Dietary counseling and surveillance 01/04/2015  . S/P gastric bypass 01/04/2015  . Chronic hip pain 09/24/2014  . Primary osteoarthritis of one knee, left 09/24/2014  . Pain in joint, ankle and foot 08/19/2013  . Dyspnea 06/30/2013  . Edema 06/30/2013  . Shoulder pain 06/30/2013  . Tongue lesion 06/19/2013  . Acquired pes planus of both feet 05/19/2013  . Calcific Achilles tendonitis 05/19/2013  . Neck muscle spasm 02/17/2013  . Carpal tunnel syndrome 06/14/2012    Past Medical History:  Diagnosis Date  .  ADHD   . Allergy   . Anemia   . Anxiety   . Arthritis   . Asthma   . Barrett's esophagus   . Bulging lumbar disc    L5-S1  . Depression   . Endometriosis   . GERD (gastroesophageal reflux disease)   . Hernia, hiatal    DDD  . HPV in female   . Hypertension   . Migraine   . Osteoporosis     Relevant past medical, surgical, family and social history reviewed and updated as indicated. Interim medical history since our last visit reviewed.  Review of Systems  Constitutional: Positive for fatigue. Negative for fever.  Respiratory: Negative.  Negative for cough and shortness of breath.   Cardiovascular: Negative.  Negative for chest pain and palpitations.  Gastrointestinal: Negative.  Negative for abdominal pain.  Endocrine: Positive for heat intolerance (hot flashes).  Genitourinary: Positive for menstrual problem (current menses for 2.5 weeks; light flow). Negative for decreased urine volume, difficulty urinating, dyspareunia, enuresis, frequency, pelvic pain, urgency, vaginal discharge and vaginal pain.       + for vaginal itching and irritation  Skin: Negative.  Negative for rash.  Neurological: Negative for dizziness, weakness, light-headedness and headaches.  Psychiatric/Behavioral: Positive for sleep disturbance. Negative for confusion. The patient is nervous/anxious.     Per HPI unless specifically indicated above     Objective:    BP 132/85 (BP Location: Left Arm, Patient Position: Sitting, Cuff Size: Normal)   Pulse 64   Temp 98.4 F (36.9 C) (Oral)   SpO2 100%   Wt Readings from Last 3 Encounters:  10/08/19 215 lb (97.5 kg)  06/13/19 205 lb 6.4 oz (93.2 kg)  05/28/19 212 lb (96.2 kg)    Physical Exam Vitals and nursing note reviewed.  Constitutional:      General: She is not in acute distress.    Appearance: Normal appearance. She is obese. She is not ill-appearing, toxic-appearing or diaphoretic.  Cardiovascular:     Rate and Rhythm: Normal rate and  regular rhythm.     Pulses: Normal pulses.     Heart sounds: No murmur.  Pulmonary:     Effort: Pulmonary effort is normal.     Breath sounds: Normal breath sounds. No wheezing, rhonchi or rales.  Abdominal:     General: Abdomen is flat. There is no distension.     Palpations: Abdomen is soft. There is no mass.     Tenderness: There is no abdominal tenderness. There is no guarding.  Skin:  General: Skin is warm and dry.     Capillary Refill: Capillary refill takes less than 2 seconds.     Coloration: Skin is not jaundiced or pale.     Findings: No erythema.  Neurological:     General: No focal deficit present.     Mental Status: She is alert and oriented to person, place, and time.     Motor: No weakness.     Gait: Gait normal.  Psychiatric:        Mood and Affect: Mood normal.        Behavior: Behavior normal.        Thought Content: Thought content normal.        Judgment: Judgment normal.       Assessment & Plan:   Problem List Items Addressed This Visit      Cardiovascular and Mediastinum   Hypertension    Patient with concerns about BP.  BP normal today in the office, discussed lifestyle changes with the patient.  Given isolated events of high blood pressure when patient is feeling anxious, will not adjust medication at this time.  Patient advised to keep BP log at home and monitor at regular times each day and bring log to next regular visit.        Genitourinary   Abnormal uterine bleeding    2.5 weeks of light vaginal bleeding is quite different from patient's normal menses. With symptoms like hot flashes and trouble sleeping, ddx includes perimenopause. Will check CBC today to be sure not anemic with bleeding. Patient advised to return to clinic if bleeding persists >4 weeks or worsens; GYN referral may be warranted.        Other   Iron deficiency anemia - Primary    Given history of anemia and over 2 weeks of light menses, will check CBC today to check on  blood count levels.      Relevant Orders   CBC with Differential/Platelet   Vaginal irritation    Ddx include yeast infection or BV.  Patient with history of both and similar symptoms today.  Patient preference to self-swab in office. If symptoms persist, return to clinic.      Relevant Orders   WET PREP FOR TRICH, YEAST, CLUE       Follow up plan: Return if symptoms worsen or fail to improve.

## 2019-11-13 NOTE — Addendum Note (Signed)
Addended by: Mardene Celeste I on: 11/13/2019 10:54 AM   Modules accepted: Orders

## 2019-11-13 NOTE — Assessment & Plan Note (Signed)
Given history of anemia and over 2 weeks of light menses, will check CBC today to check on blood count levels.

## 2019-11-13 NOTE — Assessment & Plan Note (Signed)
Ddx include yeast infection or BV.  Patient with history of both and similar symptoms today.  Patient preference to self-swab in office. If symptoms persist, return to clinic.

## 2019-11-14 ENCOUNTER — Encounter: Payer: Self-pay | Admitting: Physical Therapy

## 2019-11-14 LAB — CBC WITH DIFFERENTIAL/PLATELET
Basophils Absolute: 0 10*3/uL (ref 0.0–0.2)
Basos: 1 %
EOS (ABSOLUTE): 0.1 10*3/uL (ref 0.0–0.4)
Eos: 2 %
Hematocrit: 40.1 % (ref 34.0–46.6)
Hemoglobin: 13.5 g/dL (ref 11.1–15.9)
Immature Grans (Abs): 0 10*3/uL (ref 0.0–0.1)
Immature Granulocytes: 0 %
Lymphocytes Absolute: 1.3 10*3/uL (ref 0.7–3.1)
Lymphs: 21 %
MCH: 31.1 pg (ref 26.6–33.0)
MCHC: 33.7 g/dL (ref 31.5–35.7)
MCV: 92 fL (ref 79–97)
Monocytes Absolute: 0.6 10*3/uL (ref 0.1–0.9)
Monocytes: 10 %
Neutrophils Absolute: 4.3 10*3/uL (ref 1.4–7.0)
Neutrophils: 66 %
Platelets: 267 10*3/uL (ref 150–450)
RBC: 4.34 x10E6/uL (ref 3.77–5.28)
RDW: 11.4 % — ABNORMAL LOW (ref 11.7–15.4)
WBC: 6.4 10*3/uL (ref 3.4–10.8)

## 2019-11-14 NOTE — Therapy (Signed)
with gait.    Examination-Activity Limitations  Squat;Lift    Stability/Clinical Decision Making  Evolving/Moderate complexity    Clinical Decision Making  Moderate    Rehab Potential  Good     PT Frequency  Biweekly    PT Duration  8 weeks    PT Treatment/Interventions  ADLs/Self Care Home Management;Gait training;Functional mobility training;Therapeutic exercise;Neuromuscular re-education;Manual techniques;Energy conservation;Cryotherapy;Electrical Stimulation;Moist Heat;Stair training;Balance training;Orthotic Fit/Training;Patient/family education;Passive range of motion    PT Next Visit Plan  Progress ankle and LE strengthening exercises.  Check orthotics (discuss f/u appt. with [employer]).    PT Home Exercise Plan  Access Code: [email or web address]    Consulted and Agree with Plan of Care  Patient       Patient will benefit from skilled therapeutic intervention in order to improve the following deficits and impairments:  Abnormal gait, Decreased endurance, Decreased strength, Pain, Obesity, Difficulty walking, Decreased balance, Decreased mobility, Improper body mechanics, Decreased activity tolerance  Visit Diagnosis: Ankle instability, right  Gait difficulty  Pain in left hip  Muscle weakness (generalized)     Problem List Patient Active Problem List   Diagnosis Date Noted  . Vaginal irritation 11/13/2019  . Tinea corporis 09/16/2019  . At risk for sepsis 05/29/2019  . Impingement syndrome of right shoulder 05/04/2019  . Heart palpitations 04/17/2019  . History of PSVT (paroxysmal supraventricular tachycardia) 04/17/2019  . Degenerative tear of medial meniscus of right knee 04/08/2019  . Knee instability, right 04/07/2019  . Morbid obesity ([place]) 01/19/2019  . Sprain of calcaneofibular ligament of right ankle 12/12/2018  . Rotator cuff injury, initial encounter 12/10/2018  . S/P total knee arthroplasty, left 09/30/2018  . Dysuria 09/24/2018  . Abnormal uterine bleeding 05/16/2018  . Headache 05/16/2018  . Menorrhagia 05/16/2018  . Barrett's esophagus with dysplasia 04/07/2018  . Vitamin D deficiency 03/08/2018  . Vaginal low risk HPV DNA test positive 01/26/2018  . Left  inguinal hernia 11/14/2017  . Posterior tibial tendon dysfunction (PTTD) of right lower extremity 11/14/2017  . Infection of skin due to methicillin resistant Staphylococcus aureus (MRSA) 11/09/2017  . Abscess of abdominal wall 11/06/2017  . Vaginal yeast infection 09/21/2017  . Abscess of right foot 07/18/2017  . Mild episode of recurrent major depressive disorder ([place]) 06/27/2017  . Hidradenitis axillaris 05/28/2017  . History of Roux-en-Y gastric bypass 05/01/2017  . Migraines 05/01/2017  . Anxiety   . Arthritis   . Asthma   . Depression   . GERD (gastroesophageal reflux disease)   . Hypertension   . Osteoporosis   . Iron deficiency anemia 02/27/2017  . Paroxysmal supraventricular tachycardia ([place]) 02/27/2017  . Generalized abdominal pain 04/19/2016  . Seasonal allergic rhinitis 01/04/2016  . Primary osteoarthritis of left hip 01/29/2015  . Dietary counseling and surveillance 01/04/2015  . S/P gastric bypass 01/04/2015  . Chronic hip pain 09/24/2014  . Primary osteoarthritis of one knee, left 09/24/2014  . Pain in joint, ankle and foot 08/19/2013  . Dyspnea 06/30/2013  . Edema 06/30/2013  . Shoulder pain 06/30/2013  . Tongue lesion 06/19/2013  . Acquired pes planus of both feet 05/19/2013  . Calcific Achilles tendonitis 05/19/2013  . Neck muscle spasm 02/17/2013  . Carpal tunnel syndrome 06/14/2012   Pura Spice, PT, DPT # [place], SPT 11/14/2019, 3:53 PM  [facility] [address] [place], [place], [address] Phone: [phone]   Fax:  [phone]  Name: Nancy Sanchez MRN: [id number] Date of Birth: 11/03/1970  [place] [facility] [address] [place], [place], [address] Phone: [phone]   Fax:  [phone]  Physical Therapy Evaluation  Patient Details  Name: Nancy Sanchez MRN: [id number] Date of Birth: [birth year] Referring Provider (PT): Particia Nearing, [place]   Encounter Date: 11/13/2019  PT End of Session - 11/14/19 1156    Visit Number  1    Number of Visits  5    Date for PT Re-Evaluation  01/08/20    Authorization - Visit Number  1    Authorization - Number of Visits  10    PT Start Time  1546    PT Stop Time  1714    PT Time Calculation (min)  88 min    Activity Tolerance  Patient tolerated treatment well    Behavior During Therapy  [facility] for tasks assessed/performed       Past Medical History:  Diagnosis Date  . ADHD   . Allergy   . Anemia   . Anxiety   . Arthritis   . Asthma   . Barrett's esophagus   . Bulging lumbar disc    L5-S1  . Depression   . Endometriosis   . GERD (gastroesophageal reflux disease)   . Hernia, hiatal    DDD  . HPV in female   . Hypertension   . Migraine   . Osteoporosis     Past Surgical History:  Procedure Laterality Date  . ARTHROSCOPIC REPAIR ACL Left   . CARPAL TUNNEL RELEASE Bilateral   . ESSURE TUBAL LIGATION    . GASTRIC BYPASS    . HEEL SPUR EXCISION Left   . INGUINAL HERNIA REPAIR  12/18/2017  . JOINT REPLACEMENT Left    knee  . TONSILLECTOMY      There were no vitals filed for this visit.   Subjective Assessment - 11/14/19 1025    Subjective  Pt. comes to clinic with c/o chronic R ankle instability. Pt. reports that her R ankle is likely to roll in (inversion) during walking and the ankle brace she is wearing now is helping her to stabilize the ankle. She is trying to determine if she needs more support for the lateral aspect of her R ankle to help preventing ankle instability.  Pt. reports 0/10 pain at her R ankle at the start of today's session. Pt. reports "C"  sign pain at her L hip during gait assessment.    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 Fall with outstretched arm on her left arm on 09/25/2019 (work related injury) which causes L shoulder pain (MRI shows full rotator cuff thickness tear; pt wants to avoid surgery); Pt. falls multiple times since December 2020 due to R ankle instability.    Limitations  Walking;Standing;House hold activities    Patient Stated Goals  Increase L ankle strength/ improve gait/ prevent falls.    Currently in Pain?  No/denies         [facility] PT Assessment - 11/14/19 0001      Assessment   Medical Diagnosis  Frequent falls, Gait instability    Referring Provider (PT)  Particia Nearing, PA-C    Onset Date/Surgical Date  09/25/19    Prior Therapy  yes, not at this PT clinic      Precautions   Precautions  Fall      Restrictions   Weight Bearing Restrictions  with gait.    Examination-Activity Limitations  Squat;Lift    Stability/Clinical Decision Making  Evolving/Moderate complexity    Clinical Decision Making  Moderate    Rehab Potential  Good     PT Frequency  Biweekly    PT Duration  8 weeks    PT Treatment/Interventions  ADLs/Self Care Home Management;Gait training;Functional mobility training;Therapeutic exercise;Neuromuscular re-education;Manual techniques;Energy conservation;Cryotherapy;Electrical Stimulation;Moist Heat;Stair training;Balance training;Orthotic Fit/Training;Patient/family education;Passive range of motion    PT Next Visit Plan  Progress ankle and LE strengthening exercises.  Check orthotics (discuss f/u appt. with [employer]).    PT Home Exercise Plan  Access Code: [email or web address]    Consulted and Agree with Plan of Care  Patient       Patient will benefit from skilled therapeutic intervention in order to improve the following deficits and impairments:  Abnormal gait, Decreased endurance, Decreased strength, Pain, Obesity, Difficulty walking, Decreased balance, Decreased mobility, Improper body mechanics, Decreased activity tolerance  Visit Diagnosis: Ankle instability, right  Gait difficulty  Pain in left hip  Muscle weakness (generalized)     Problem List Patient Active Problem List   Diagnosis Date Noted  . Vaginal irritation 11/13/2019  . Tinea corporis 09/16/2019  . At risk for sepsis 05/29/2019  . Impingement syndrome of right shoulder 05/04/2019  . Heart palpitations 04/17/2019  . History of PSVT (paroxysmal supraventricular tachycardia) 04/17/2019  . Degenerative tear of medial meniscus of right knee 04/08/2019  . Knee instability, right 04/07/2019  . Morbid obesity ([place]) 01/19/2019  . Sprain of calcaneofibular ligament of right ankle 12/12/2018  . Rotator cuff injury, initial encounter 12/10/2018  . S/P total knee arthroplasty, left 09/30/2018  . Dysuria 09/24/2018  . Abnormal uterine bleeding 05/16/2018  . Headache 05/16/2018  . Menorrhagia 05/16/2018  . Barrett's esophagus with dysplasia 04/07/2018  . Vitamin D deficiency 03/08/2018  . Vaginal low risk HPV DNA test positive 01/26/2018  . Left  inguinal hernia 11/14/2017  . Posterior tibial tendon dysfunction (PTTD) of right lower extremity 11/14/2017  . Infection of skin due to methicillin resistant Staphylococcus aureus (MRSA) 11/09/2017  . Abscess of abdominal wall 11/06/2017  . Vaginal yeast infection 09/21/2017  . Abscess of right foot 07/18/2017  . Mild episode of recurrent major depressive disorder ([place]) 06/27/2017  . Hidradenitis axillaris 05/28/2017  . History of Roux-en-Y gastric bypass 05/01/2017  . Migraines 05/01/2017  . Anxiety   . Arthritis   . Asthma   . Depression   . GERD (gastroesophageal reflux disease)   . Hypertension   . Osteoporosis   . Iron deficiency anemia 02/27/2017  . Paroxysmal supraventricular tachycardia ([place]) 02/27/2017  . Generalized abdominal pain 04/19/2016  . Seasonal allergic rhinitis 01/04/2016  . Primary osteoarthritis of left hip 01/29/2015  . Dietary counseling and surveillance 01/04/2015  . S/P gastric bypass 01/04/2015  . Chronic hip pain 09/24/2014  . Primary osteoarthritis of one knee, left 09/24/2014  . Pain in joint, ankle and foot 08/19/2013  . Dyspnea 06/30/2013  . Edema 06/30/2013  . Shoulder pain 06/30/2013  . Tongue lesion 06/19/2013  . Acquired pes planus of both feet 05/19/2013  . Calcific Achilles tendonitis 05/19/2013  . Neck muscle spasm 02/17/2013  . Carpal tunnel syndrome 06/14/2012   Pura Spice, PT, DPT # [place], SPT 11/14/2019, 3:53 PM  [facility] [address] [place], [place], [address] Phone: [phone]   Fax:  [phone]  Name: Nancy Sanchez MRN: [id number] Date of Birth: 11/03/1970  [place] [facility] [address] [place], [place], [address] Phone: [phone]   Fax:  [phone]  Physical Therapy Evaluation  Patient Details  Name: Nancy Sanchez MRN: [id number] Date of Birth: [birth year] Referring Provider (PT): Particia Nearing, [place]   Encounter Date: 11/13/2019  PT End of Session - 11/14/19 1156    Visit Number  1    Number of Visits  5    Date for PT Re-Evaluation  01/08/20    Authorization - Visit Number  1    Authorization - Number of Visits  10    PT Start Time  1546    PT Stop Time  1714    PT Time Calculation (min)  88 min    Activity Tolerance  Patient tolerated treatment well    Behavior During Therapy  [facility] for tasks assessed/performed       Past Medical History:  Diagnosis Date  . ADHD   . Allergy   . Anemia   . Anxiety   . Arthritis   . Asthma   . Barrett's esophagus   . Bulging lumbar disc    L5-S1  . Depression   . Endometriosis   . GERD (gastroesophageal reflux disease)   . Hernia, hiatal    DDD  . HPV in female   . Hypertension   . Migraine   . Osteoporosis     Past Surgical History:  Procedure Laterality Date  . ARTHROSCOPIC REPAIR ACL Left   . CARPAL TUNNEL RELEASE Bilateral   . ESSURE TUBAL LIGATION    . GASTRIC BYPASS    . HEEL SPUR EXCISION Left   . INGUINAL HERNIA REPAIR  12/18/2017  . JOINT REPLACEMENT Left    knee  . TONSILLECTOMY      There were no vitals filed for this visit.   Subjective Assessment - 11/14/19 1025    Subjective  Pt. comes to clinic with c/o chronic R ankle instability. Pt. reports that her R ankle is likely to roll in (inversion) during walking and the ankle brace she is wearing now is helping her to stabilize the ankle. She is trying to determine if she needs more support for the lateral aspect of her R ankle to help preventing ankle instability.  Pt. reports 0/10 pain at her R ankle at the start of today's session. Pt. reports "C"  sign pain at her L hip during gait assessment.    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 Fall with outstretched arm on her left arm on 09/25/2019 (work related injury) which causes L shoulder pain (MRI shows full rotator cuff thickness tear; pt wants to avoid surgery); Pt. falls multiple times since December 2020 due to R ankle instability.    Limitations  Walking;Standing;House hold activities    Patient Stated Goals  Increase L ankle strength/ improve gait/ prevent falls.    Currently in Pain?  No/denies         [facility] PT Assessment - 11/14/19 0001      Assessment   Medical Diagnosis  Frequent falls, Gait instability    Referring Provider (PT)  Particia Nearing, PA-C    Onset Date/Surgical Date  09/25/19    Prior Therapy  yes, not at this PT clinic      Precautions   Precautions  Fall      Restrictions   Weight Bearing Restrictions

## 2019-11-20 ENCOUNTER — Ambulatory Visit: Payer: BC Managed Care – PPO | Admitting: Physical Therapy

## 2019-11-24 ENCOUNTER — Encounter: Payer: Self-pay | Admitting: Nurse Practitioner

## 2019-11-24 MED ORDER — CLINDAMYCIN PHOSPHATE 2 % VA CREA: 1.0000 | TOPICAL_CREAM | Freq: Every day | VAGINAL | 0 refills | Status: AC

## 2019-11-26 ENCOUNTER — Other Ambulatory Visit: Payer: Self-pay

## 2019-11-26 ENCOUNTER — Ambulatory Visit: Payer: BC Managed Care – PPO | Attending: Family Medicine | Admitting: Physical Therapy

## 2019-11-26 ENCOUNTER — Encounter: Payer: Self-pay | Admitting: Physical Therapy

## 2019-11-26 DIAGNOSIS — M25371 Other instability, right ankle: Secondary | ICD-10-CM | POA: Insufficient documentation

## 2019-11-26 DIAGNOSIS — R269 Unspecified abnormalities of gait and mobility: Secondary | ICD-10-CM

## 2019-11-26 DIAGNOSIS — M25552 Pain in left hip: Secondary | ICD-10-CM | POA: Insufficient documentation

## 2019-11-26 DIAGNOSIS — M6281 Muscle weakness (generalized): Secondary | ICD-10-CM | POA: Insufficient documentation

## 2019-11-26 NOTE — Patient Instructions (Signed)
Access Code: [email or web address]  URL: [email or web address]  Date: 11/26/2019  Prepared by: Dorene Grebe   Exercises  Arch Lifting - 10 reps - 3 sets - 5 sec hold - 1x daily - 7x weekly  Seated Great Toe Extension - 10 reps - 3 sets - 1x daily - 7x weekly  Seated Plantar Fascia Mobilization with Small Ball - 10 reps - 3 sets - 1x daily - 7x weekly  Seated Self Great Toe Stretch - 10 reps - 3 sets - 1x daily - 7x weekly  Standing Heel Raise with Chair Support - 10 reps - 2 sets - 1x daily - 7x weekly  Standing Ankle Dorsiflexion Stretch - 10 reps - 3 sets - 1x daily - 7x weekly  Seated Ankle Eversion with Anchored Resistance - 10 reps - 3 sets - 1x daily - 7x weekly  Seated Ankle Inversion with Anchored Resistance - 10 reps - 3 sets - 1x daily - 7x weekly

## 2019-11-26 NOTE — Therapy (Signed)
[facility] [address] [place], [place], [address] Phone: [phone]   Fax:  [phone]  Physical Therapy Treatment  Patient Details  Name: Nancy Sanchez MRN: [id number] Date of Birth: August 19, 1971 Referring Provider (PT): Volney American, [place]   Encounter Date: 11/26/2019  PT End of Session - 11/26/19 1705    Visit Number  2    Number of Visits  5    Date for PT Re-Evaluation  01/08/20    Authorization - Visit Number  2    Authorization - Number of Visits  10    PT Start Time  1512    PT Stop Time  1621    PT Time Calculation (min)  69 min    Activity Tolerance  Patient tolerated treatment well    Behavior During Therapy  [facility] for tasks assessed/performed       Past Medical History:  Diagnosis Date  . ADHD   . Allergy   . Anemia   . Anxiety   . Arthritis   . Asthma   . Barrett's esophagus   . Bulging lumbar disc    L5-S1  . Depression   . Endometriosis   . GERD (gastroesophageal reflux disease)   . Hernia, hiatal    DDD  . HPV in female   . Hypertension   . Migraine   . Osteoporosis     Past Surgical History:  Procedure Laterality Date  . ARTHROSCOPIC REPAIR ACL Left   . CARPAL TUNNEL RELEASE Bilateral   . ESSURE TUBAL LIGATION    . GASTRIC BYPASS    . HEEL SPUR EXCISION Left   . INGUINAL HERNIA REPAIR  12/18/2017  . JOINT REPLACEMENT Left    knee  . TONSILLECTOMY      There were no vitals filed for this visit.  Subjective Assessment - 11/26/19 1659    Subjective  Pt. reports 1-2/10 pain at TP tendon insertion point at navicular in R foot. Pt. reports that her B toes tend to curl (toes flexion) during walking. Pt. states her L hip pain is getting better sine the last visit. Pt. reports increased pain to 4-5/10 at TP insertion point in R foot during heel raises exercise. No recent falls reported.    Pertinent History  Pt. is a 49 yo female who is currently working as a Charity fundraiser at  [employer]. Pt. is right handed dominant. Fall with outstretched arm on her left arm on 09/25/2019 (work related injury) which causes L shoulder pain (MRI shows full rotator cuff thickness tear; pt wants to avoid surgery); Pt. falls multiple times since December 2020 due to R ankle instability.    Limitations  Walking;Standing;House hold activities    Patient Stated Goals  Increase L ankle strength/ improve gait/ prevent falls.    Currently in Pain?  Yes    Pain Score  2     Pain Location  Ankle    Pain Orientation  Right    Pain Type  Chronic pain    Pain Onset  More than a month ago    Pain Frequency  Intermittent        Therex: Seated Arch Lifting - 20 reps x 1 sets, verbal cueing for proper techniques Standing Heel Raise with //-bars light touching for UE assist 15 reps x 1 set Seated Ankle Inversion with SPT hold the RTB 15 reps x 1 set each side, verbal cueing for proper techniques Rock walking within //-bars 3 min,  verbal cueing for gait sequence Forward walking within //- bars 3 laps Discussed HEP (see handouts)  Manual Therapy: Supine MET for DF, 5 s x 10 reps x 1 set each side, verbal cueing for amount of effort used Supine AP talocrural joint mobs 60 s x 1 set each side Supine great toe AP mobs (Grade III) for great toe extension (R&L: 50 deg.) 2 mins on each side Supine B hamstring and gluts stretch 30 s x 1 set on each side       PT Education - 11/26/19 1704    Education Details  Add new therex into HEP    Person(s) Educated  Patient    Methods  Explanation;Demonstration;Handout    Comprehension  Verbalized understanding;Returned demonstration          PT Long Term Goals - 11/14/19 1537      PT LONG TERM GOAL #1   Title  Pt. will increase FOTO to 61 to improve pain-free ankle mobility/ walking.    Baseline  Current FOTO 49. Goal 61    Time  8    Period  Weeks    Status  New    Target Date  01/08/20      PT LONG TERM GOAL #2   Title  Pt. will increase  MMT by 1/2 grade in hip flexion, hip abd, DF and IV bilaterally to improve gait independence.    Baseline  hip flexion (R 4/5, L 3+/5), hip ABD (R 4+/5, L 4+/5), DF (R 4/5, L 4+/5), IV (R 3+/5, L 4+/5)    Time  8    Period  Weeks    Status  New    Target Date  01/08/20      PT LONG TERM GOAL #3   Title  Pt. is able to ambulate with normalized gait pattern on outside surfaces with no loss of balance to improve safety/ independence with walking.    Baseline  Pt. ambulated with L antalgic gait with trunk lean L>R, narrowed BOS, decreased hp flexion, hip extension, guarded pelvic rotation and decreased push off during gait assessment    Time  8    Period  Weeks    Status  New    Target Date  01/08/20      PT LONG TERM GOAL #4   Title  Pt. will be able to perform ADLs and IADLs without c/o R ankle instability and pain.    Baseline  Pt. has 0/10 pain in her R ankle at the beginning of the first evaluation and c/o R ankle is rolling in/giving out during amubulation.    Time  8    Period  Weeks    Status  New    Target Date  01/08/20            Plan - 11/26/19 1705    Clinical Impression Statement  Pt. demonstrates decreased ROM in DF during passive DF assessment. MET does not increase PROM in DF during this session. Pt. demonstrates equal ROM in B great toes (R&L:50 deg.) but more tightness is felt R > L during manual great toe flexion. Pt. demonstrates overall good B hamstring and gluts flexibility during supine manual stretches. Pt. presents decreased great toe extension R > L during rock walking within //- bars. Pt also presents with excessive pronation in B foot R > L during standing and walking within// bars. Pt. requires minimal verbal cueing during foot doming therex.    Examination-Activity Limitations  Squat;Lift  Stability/Clinical Decision Making  Evolving/Moderate complexity    Clinical Decision Making  Moderate    Rehab Potential  Good    PT Frequency  Biweekly    PT  Duration  8 weeks    PT Treatment/Interventions  ADLs/Self Care Home Management;Gait training;Functional mobility training;Therapeutic exercise;Neuromuscular re-education;Manual techniques;Energy conservation;Cryotherapy;Electrical Stimulation;Moist Heat;Stair training;Balance training;Orthotic Fit/Training;Patient/family education;Passive range of motion    PT Next Visit Plan  Reassess B great toe extension, DF, gait without shoes.  Check orthotics (discuss f/u appt. with [employer]).    PT Home Exercise Plan  Access Code: [email or web address]    Consulted and Agree with Plan of Care  Patient       Patient will benefit from skilled therapeutic intervention in order to improve the following deficits and impairments:  Abnormal gait, Decreased endurance, Decreased strength, Pain, Obesity, Difficulty walking, Decreased balance, Decreased mobility, Improper body mechanics, Decreased activity tolerance  Visit Diagnosis: Ankle instability, right  Gait difficulty  Pain in left hip  Muscle weakness (generalized)     Problem List Patient Active Problem List   Diagnosis Date Noted  . Vaginal irritation 11/13/2019  . Tinea corporis 09/16/2019  . At risk for sepsis 05/29/2019  . Impingement syndrome of right shoulder 05/04/2019  . Heart palpitations 04/17/2019  . History of PSVT (paroxysmal supraventricular tachycardia) 04/17/2019  . Degenerative tear of medial meniscus of right knee 04/08/2019  . Knee instability, right 04/07/2019  . Morbid obesity ([place]) 01/19/2019  . Sprain of calcaneofibular ligament of right ankle 12/12/2018  . Rotator cuff injury, initial encounter 12/10/2018  . S/P total knee arthroplasty, left 09/30/2018  . Dysuria 09/24/2018  . Abnormal uterine bleeding 05/16/2018  . Headache 05/16/2018  . Menorrhagia 05/16/2018  . Barrett's esophagus with dysplasia 04/07/2018  . Vitamin D deficiency 03/08/2018  . Vaginal low risk HPV DNA test positive 01/26/2018  . Left inguinal hernia  11/14/2017  . Posterior tibial tendon dysfunction (PTTD) of right lower extremity 11/14/2017  . Infection of skin due to methicillin resistant Staphylococcus aureus (MRSA) 11/09/2017  . Abscess of abdominal wall 11/06/2017  . Vaginal yeast infection 09/21/2017  . Abscess of right foot 07/18/2017  . Mild episode of recurrent major depressive disorder ([place]) 06/27/2017  . Hidradenitis axillaris 05/28/2017  . History of Roux-en-Y gastric bypass 05/01/2017  . Migraines 05/01/2017  . Anxiety   . Arthritis   . Asthma   . Depression   . GERD (gastroesophageal reflux disease)   . Hypertension   . Osteoporosis   . Iron deficiency anemia 02/27/2017  . Paroxysmal supraventricular tachycardia ([place]) 02/27/2017  . Generalized abdominal pain 04/19/2016  . Seasonal allergic rhinitis 01/04/2016  . Primary osteoarthritis of left hip 01/29/2015  . Dietary counseling and surveillance 01/04/2015  . S/P gastric bypass 01/04/2015  . Chronic hip pain 09/24/2014  . Primary osteoarthritis of one knee, left 09/24/2014  . Pain in joint, ankle and foot 08/19/2013  . Dyspnea 06/30/2013  . Edema 06/30/2013  . Shoulder pain 06/30/2013  . Tongue lesion 06/19/2013  . Acquired pes planus of both feet 05/19/2013  . Calcific Achilles tendonitis 05/19/2013  . Neck muscle spasm 02/17/2013  . Carpal tunnel syndrome 06/14/2012   Pura Spice, PT, DPT # [id number] Mittie Bodo, SPT 11/27/2019, 10:18 AM  [facility] [address] [place], [place], [address] Phone: [phone]   Fax:  [phone]  Name: Nancy Sanchez MRN: [id number] Date of Birth: 1971-06-05

## 2019-11-27 ENCOUNTER — Ambulatory Visit: Payer: BC Managed Care – PPO | Admitting: Physical Therapy

## 2019-12-04 ENCOUNTER — Encounter: Payer: BC Managed Care – PPO | Admitting: Physical Therapy

## 2019-12-11 ENCOUNTER — Ambulatory Visit: Payer: BC Managed Care – PPO | Admitting: Physical Therapy

## 2019-12-15 ENCOUNTER — Encounter: Payer: Self-pay | Admitting: Physical Therapy

## 2019-12-16 ENCOUNTER — Encounter: Payer: BC Managed Care – PPO | Admitting: Physical Therapy

## 2019-12-17 ENCOUNTER — Encounter: Payer: Self-pay | Admitting: Family Medicine

## 2019-12-17 ENCOUNTER — Encounter: Payer: Self-pay | Admitting: Physical Therapy

## 2019-12-18 ENCOUNTER — Other Ambulatory Visit: Payer: Self-pay

## 2019-12-18 ENCOUNTER — Encounter: Payer: BC Managed Care – PPO | Admitting: Physical Therapy

## 2019-12-18 ENCOUNTER — Ambulatory Visit: Payer: BC Managed Care – PPO | Admitting: Physical Therapy

## 2019-12-18 DIAGNOSIS — M25371 Other instability, right ankle: Secondary | ICD-10-CM | POA: Diagnosis not present

## 2019-12-18 DIAGNOSIS — M6281 Muscle weakness (generalized): Secondary | ICD-10-CM

## 2019-12-18 DIAGNOSIS — R269 Unspecified abnormalities of gait and mobility: Secondary | ICD-10-CM

## 2019-12-18 DIAGNOSIS — M25552 Pain in left hip: Secondary | ICD-10-CM | POA: Diagnosis not present

## 2019-12-18 NOTE — Patient Instructions (Signed)
Access Code: [email or web address]  URL: [email or web address]  Date: 12/18/2019  Prepared by: Dorene Grebe   Exercises  Arch Lifting - 10 reps - 3 sets - 5 sec hold - 1x daily - 7x weekly  Seated Plantar Fascia Mobilization with Small Ball - 10 reps - 3 sets - 1x daily - 7x weekly  Seated Self Great Toe Stretch - 10 reps - 3 sets - 1x daily - 7x weekly  Standing Ankle Dorsiflexion Stretch - 10 reps - 3 sets - 1x daily - 7x weekly  Seated Heel Raise - 10 reps - 3 sets - 1x daily - 7x weekly  Seated Ankle Eversion with Anchored Resistance - 10 reps - 3 sets - 1x daily - 7x weekly  Seated Ankle Inversion with Anchored Resistance - 10 reps - 3 sets - 1x daily - 7x weekly

## 2019-12-19 ENCOUNTER — Encounter: Payer: Self-pay | Admitting: Physical Therapy

## 2019-12-19 ENCOUNTER — Ambulatory Visit (INDEPENDENT_AMBULATORY_CARE_PROVIDER_SITE_OTHER): Payer: BC Managed Care – PPO | Admitting: Family Medicine

## 2019-12-19 ENCOUNTER — Encounter: Payer: Self-pay | Admitting: Family Medicine

## 2019-12-19 VITALS — BP 126/87 | HR 75 | Temp 98.3°F | Ht 62.0 in | Wt 210.2 lb

## 2019-12-19 DIAGNOSIS — Z7289 Other problems related to lifestyle: Secondary | ICD-10-CM | POA: Diagnosis not present

## 2019-12-19 DIAGNOSIS — F331 Major depressive disorder, recurrent, moderate: Secondary | ICD-10-CM | POA: Diagnosis not present

## 2019-12-19 DIAGNOSIS — F419 Anxiety disorder, unspecified: Secondary | ICD-10-CM

## 2019-12-19 DIAGNOSIS — Z789 Other specified health status: Secondary | ICD-10-CM

## 2019-12-19 MED ORDER — QUETIAPINE FUMARATE 50 MG PO TABS: 50.0000 mg | ORAL_TABLET | Freq: Every day | ORAL | 0 refills | Status: DC

## 2019-12-19 NOTE — Progress Notes (Signed)
BP 126/87   Pulse 75   Temp 98.3 F (36.8 C) (Oral)   Ht 5\' 2"  (1.575 m)   Wt 210 lb 3 oz (95.3 kg)   SpO2 97%   BMI 38.44 kg/m    Subjective:    Patient ID: , female    DOB: Aug 06, 1971, 49 y.o.   MRN: [id number]  HPI: Nancy Sanchez is a 49 y.o. female  Chief Complaint  Patient presents with  . Anxiety   Presenting today to discuss worsening moods, anxiety and sleep. Taking 2 buspar first thing in the morning in addition to her lexapro and wellbutrin regimen but still having consistent anxiousness, frequent panic episodes, anhedonia, and some suicidal thoughts several months ago at one point.   Has increased alcohol intake additionally, which has been worrisome for her. At least 2-3 beers at night and 6-8 oz of liquor and some beers on a bad day. This is for stress relief and sleep for her. Very interested in counseling. No current thoughts of self harm or HI.   Depression screen [facility] 2/9 12/19/2019 10/08/2019 06/13/2019  Decreased Interest 3 3 1   Down, Depressed, Hopeless 2 0 0  PHQ - 2 Score 5 3 1   Altered sleeping 3 2 1   Tired, decreased energy 3 2 1   Change in appetite 3 2 0  Feeling bad or failure about yourself  1 0 0  Trouble concentrating 3 3 1   Moving slowly or fidgety/restless 1 0 0  Suicidal thoughts 0 0 0  PHQ-9 Score 19 12 4   Difficult doing work/chores - Not difficult at all Not difficult at all   GAD 7 : Generalized Anxiety Score 12/19/2019 10/08/2019 06/13/2019 01/19/2019  Nervous, Anxious, on Edge 2 0 1 1  Control/stop worrying 2 1 0 2  Worry too much - different things 3 0 0 2  Trouble relaxing 3 1 1 1   Restless 1 0 1 0  Easily annoyed or irritable 1 1 0 0  Afraid - awful might happen 1 0 0 1  Total GAD 7 Score 13 3 3 7   Anxiety Difficulty Very difficult Not difficult at all Somewhat difficult Somewhat difficult   Relevant past medical, surgical, family and social history reviewed and updated as indicated. Interim medical  history since our last visit reviewed. Allergies and medications reviewed and updated.  Review of Systems  Per HPI unless specifically indicated above     Objective:    BP 126/87   Pulse 75   Temp 98.3 F (36.8 C) (Oral)   Ht 5\' 2"  (1.575 m)   Wt 210 lb 3 oz (95.3 kg)   SpO2 97%   BMI 38.44 kg/m   Wt Readings from Last 3 Encounters:  12/19/19 210 lb 3 oz (95.3 kg)  11/13/19 220 lb (99.8 kg)  10/08/19 215 lb (97.5 kg)    Physical Exam Vitals and nursing note reviewed.  Constitutional:      Appearance: Normal appearance. She is not ill-appearing.  HENT:     Head: Atraumatic.  Eyes:     Extraocular Movements: Extraocular movements intact.     Conjunctiva/sclera: Conjunctivae normal.  Cardiovascular:     Rate and Rhythm: Normal rate and regular rhythm.     Heart sounds: Normal heart sounds.  Pulmonary:     Effort: Pulmonary effort is normal.     Breath sounds: Normal breath sounds.  Musculoskeletal:        General: Normal range of motion.  Cervical back: Normal range of motion and neck supple.  Skin:    General: Skin is warm and dry.  Neurological:     Mental Status: She is alert and oriented to person, place, and time.  Psychiatric:        Mood and Affect: Mood normal.        Thought Content: Thought content normal.        Judgment: Judgment normal.     Results for orders placed or performed in visit on 11/13/19  WET PREP FOR [place], YEAST, CLUE   Specimen: Vaginal Fluid   VAGINAL FLUI  Result Value Ref Range   Trichomonas Exam Negative Negative   Yeast Exam Negative Negative   Clue Cell Exam Positive (A) Negative  CBC with Differential/Platelet  Result Value Ref Range   WBC 6.4 3.4 - 10.8 x10E3/uL   RBC 4.34 3.77 - 5.28 x10E6/uL   Hemoglobin 13.5 11.1 - 15.9 g/dL   Hematocrit 40.1 34.0 - 46.6 %   MCV 92 79 - 97 fL   MCH 31.1 26.6 - 33.0 pg   MCHC 33.7 31.5 - 35.7 g/dL   RDW 11.4 (L) 11.7 - 15.4 %   Platelets 267 150 - 450 x10E3/uL   Neutrophils  66 Not Estab. %   Lymphs 21 Not Estab. %   Monocytes 10 Not Estab. %   Eos 2 Not Estab. %   Basos 1 Not Estab. %   Neutrophils Absolute 4.3 1.4 - 7.0 x10E3/uL   Lymphocytes Absolute 1.3 0.7 - 3.1 x10E3/uL   Monocytes Absolute 0.6 0.1 - 0.9 x10E3/uL   EOS (ABSOLUTE) 0.1 0.0 - 0.4 x10E3/uL   Basophils Absolute 0.0 0.0 - 0.2 x10E3/uL   Immature Granulocytes 0 Not Estab. %   Immature Grans (Abs) 0.0 0.0 - 0.1 x10E3/uL      Assessment & Plan:   Problem List Items Addressed This Visit      Other   Anxiety - Primary    Add seroquel at bedtime to help with anxiety, moods and sleep. Work on decreasing alcohol intake, discussed safety risks as well as worsening moods and insomnia with alcohol use consistently. Referral placed to Psychiatry for further management.       Relevant Orders   Ambulatory referral to Psychiatry   Depression    Add seroquel at bedtime to help with anxiety, moods and sleep. Work on decreasing alcohol intake, discussed safety risks as well as worsening moods and insomnia with alcohol use consistently. Referral placed to Psychiatry for further management.       Relevant Orders   Ambulatory referral to Psychiatry    Other Visit Diagnoses    Alcohol use       Long discussion about reducing intake, pt agreeable. Trial seroquel at bedtime to help improve sleep which will hopefully reduce desire to drink to excess      25 minutes spent in direct patient care and counseling  Follow up plan: Return in about 4 weeks (around 01/16/2020) for Sleep f/u.

## 2019-12-19 NOTE — Assessment & Plan Note (Signed)
Add seroquel at bedtime to help with anxiety, moods and sleep. Work on decreasing alcohol intake, discussed safety risks as well as worsening moods and insomnia with alcohol use consistently. Referral placed to Psychiatry for further management.

## 2019-12-19 NOTE — Assessment & Plan Note (Signed)
Add seroquel at bedtime to help with anxiety, moods and sleep. Work on decreasing alcohol intake, discussed safety risks as well as worsening moods and insomnia with alcohol use consistently. Referral placed to Psychiatry for further management.  

## 2019-12-19 NOTE — Therapy (Signed)
[facility] [address] [place], [place], [address] Phone: [phone]   Fax:  [phone]  Physical Therapy Treatment  Patient Details  Name: Nancy Sanchez MRN: [id number] Date of Birth: [birth year] Referring Provider (PT): Particia Nearing, [place]   Encounter Date: 12/18/2019  PT End of Session - 12/19/19 0805    Visit Number  3    Number of Visits  5    Date for PT Re-Evaluation  01/08/20    Authorization - Visit Number  3    Authorization - Number of Visits  10    PT Start Time  1655    PT Stop Time  1749    PT Time Calculation (min)  54 min    Activity Tolerance  Patient tolerated treatment well    Behavior During Therapy  [facility] for tasks assessed/performed       Past Medical History:  Diagnosis Date  . ADHD   . Allergy   . Anemia   . Anxiety   . Arthritis   . Asthma   . Barrett's esophagus   . Bulging lumbar disc    L5-S1  . Depression   . Endometriosis   . GERD (gastroesophageal reflux disease)   . Hernia, hiatal    DDD  . HPV in female   . Hypertension   . Migraine   . Osteoporosis     Past Surgical History:  Procedure Laterality Date  . ARTHROSCOPIC REPAIR ACL Left   . CARPAL TUNNEL RELEASE Bilateral   . ESSURE TUBAL LIGATION    . GASTRIC BYPASS    . HEEL SPUR EXCISION Left   . INGUINAL HERNIA REPAIR  12/18/2017  . JOINT REPLACEMENT Left    knee  . TONSILLECTOMY      There were no vitals filed for this visit.  Subjective Assessment - 12/19/19 0759    Subjective  Pt. reports increased pain in TP tendon insertion point and the TP tendon posterior to the medial malleolus during walking and standing heel raises therex. Pt. also reports increased pain in heel of the R foot and in mid foot during work (pt has to stand 6 hour per day). No recent falls reported.    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 Fall  with outstretched arm on her left arm on 09/25/2019 (work related injury) which causes L shoulder pain (MRI shows full rotator cuff thickness tear; pt wants to avoid surgery); Pt. falls multiple times since December 2020 due to R ankle instability.    Limitations  Walking;Standing;House hold activities    Patient Stated Goals  Increase L ankle strength/ improve gait/ prevent falls.    Currently in Pain?  Yes   no objective number was given   Pain Location  Ankle    Pain Orientation  Right    Pain Type  Chronic pain    Pain Onset  More than a month ago    Pain Frequency  Intermittent        Therex: Standing Arch Lifting - 20 reps  - 5 sec hold  Seated/Standing Plantar Fascia Mobilization with Small Ball, hold for 10 s, total of 5 min (standing and seated) Seated Heel Raise - 20 reps Forward gait pattern training x 5 laps within //-bars, forward ambulation in hallway x 2 laps, verbal cueing and demonstration for exaggerated bilateral heel strike, great toe extension  and plantarflexion HEP (see handout)  Manual Therapy: Supine manual R plantar fascia stretching 15 s x 10 reps Supine R great toe extension stretching 8 s x 15 reps Supine manual plantar fascia trigger point release 4 min     PT Education - 12/19/19 0804    Education Details  discuss new therex added in HEP    Person(s) Educated  Patient    Methods  Explanation;Demonstration;Handout    Comprehension  Verbalized understanding;Returned demonstration          PT Long Term Goals - 11/14/19 1537      PT LONG TERM GOAL #1   Title  Pt. will increase FOTO to 61 to improve pain-free ankle mobility/ walking.    Baseline  Current FOTO 49. Goal 61    Time  8    Period  Weeks    Status  New    Target Date  01/08/20      PT LONG TERM GOAL #2   Title  Pt. will increase MMT by 1/2 grade in hip flexion, hip abd, DF and IV bilaterally to improve gait independence.    Baseline  hip flexion (R 4/5, L 3+/5), hip ABD (R 4+/5, L  4+/5), DF (R 4/5, L 4+/5), IV (R 3+/5, L 4+/5)    Time  8    Period  Weeks    Status  New    Target Date  01/08/20      PT LONG TERM GOAL #3   Title  Pt. is able to ambulate with normalized gait pattern on outside surfaces with no loss of balance to improve safety/ independence with walking.    Baseline  Pt. ambulated with L antalgic gait with trunk lean L>R, narrowed BOS, decreased hp flexion, hip extension, guarded pelvic rotation and decreased push off during gait assessment    Time  8    Period  Weeks    Status  New    Target Date  01/08/20      PT LONG TERM GOAL #4   Title  Pt. will be able to perform ADLs and IADLs without c/o R ankle instability and pain.    Baseline  Pt. has 0/10 pain in her R ankle at the beginning of the first evaluation and c/o R ankle is rolling in/giving out during amubulation.    Time  8    Period  Weeks    Status  New    Target Date  01/08/20            Plan - 12/19/19 0818    Clinical Impression Statement  Pt. has increased tenderness in R heel and mid foot (plantar fascia) during palpation and seated/standing plantar fascia mobilization with small ball. Pt has decreased pain in R heel and mid foot from ball mobilization. Pt. needs verbal and tactile cueing to perform standing arch lifting without toe flexion. Pt. demonstrates decreased bilaterally heel strike and toe extension during forward walking within //-bars but is able to perform with increased B heel strike and toe extension after verbal cueing/demonstration.    Examination-Activity Limitations  Squat;Lift    Stability/Clinical Decision Making  Evolving/Moderate complexity    Clinical Decision Making  Moderate    Rehab Potential  Good    PT Frequency  Biweekly    PT Duration  8 weeks    PT Treatment/Interventions  ADLs/Self Care Home Management;Gait training;Functional mobility training;Therapeutic exercise;Neuromuscular re-education;Manual techniques;Energy  conservation;Cryotherapy;Electrical Stimulation;Moist Heat;Stair training;Balance training;Orthotic Fit/Training;Patient/family education;Passive range of motion  PT Next Visit Plan  Reassess B great toe extension, DF, gait without shoes.  Check orthotics (discuss f/u appt. with [employer]).    PT Home Exercise Plan  Access Code: [email or web address]    Consulted and Agree with Plan of Care  Patient       Patient will benefit from skilled therapeutic intervention in order to improve the following deficits and impairments:  Abnormal gait, Decreased endurance, Decreased strength, Pain, Obesity, Difficulty walking, Decreased balance, Decreased mobility, Improper body mechanics, Decreased activity tolerance  Visit Diagnosis: Ankle instability, right  Gait difficulty  Pain in left hip  Muscle weakness (generalized)     Problem List Patient Active Problem List   Diagnosis Date Noted  . Vaginal irritation 11/13/2019  . Tinea corporis 09/16/2019  . At risk for sepsis 05/29/2019  . Impingement syndrome of right shoulder 05/04/2019  . Heart palpitations 04/17/2019  . History of PSVT (paroxysmal supraventricular tachycardia) 04/17/2019  . Degenerative tear of medial meniscus of right knee 04/08/2019  . Knee instability, right 04/07/2019  . Morbid obesity (HCC) 01/19/2019  . Sprain of calcaneofibular ligament of right ankle 12/12/2018  . Rotator cuff injury, initial encounter 12/10/2018  . S/P total knee arthroplasty, left 09/30/2018  . Dysuria 09/24/2018  . Abnormal uterine bleeding 05/16/2018  . Headache 05/16/2018  . Menorrhagia 05/16/2018  . Barrett's esophagus with dysplasia 04/07/2018  . Vitamin D deficiency 03/08/2018  . Vaginal low risk HPV DNA test positive 01/26/2018  . Left inguinal hernia 11/14/2017  . Posterior tibial tendon dysfunction (PTTD) of right lower extremity 11/14/2017  . Infection of skin due to methicillin resistant Staphylococcus aureus (MRSA) 11/09/2017  . Abscess of  abdominal wall 11/06/2017  . Vaginal yeast infection 09/21/2017  . Abscess of right foot 07/18/2017  . Mild episode of recurrent major depressive disorder (HCC) 06/27/2017  . Hidradenitis axillaris 05/28/2017  . History of Roux-en-Y gastric bypass 05/01/2017  . Migraines 05/01/2017  . Anxiety   . Arthritis   . Asthma   . Depression   . GERD (gastroesophageal reflux disease)   . Hypertension   . Osteoporosis   . Iron deficiency anemia 02/27/2017  . Paroxysmal supraventricular tachycardia (HCC) 02/27/2017  . Generalized abdominal pain 04/19/2016  . Seasonal allergic rhinitis 01/04/2016  . Primary osteoarthritis of left hip 01/29/2015  . Dietary counseling and surveillance 01/04/2015  . S/P gastric bypass 01/04/2015  . Chronic hip pain 09/24/2014  . Primary osteoarthritis of one knee, left 09/24/2014  . Pain in joint, ankle and foot 08/19/2013  . Dyspnea 06/30/2013  . Edema 06/30/2013  . Shoulder pain 06/30/2013  . Tongue lesion 06/19/2013  . Acquired pes planus of both feet 05/19/2013  . Calcific Achilles tendonitis 05/19/2013  . Neck muscle spasm 02/17/2013  . Carpal tunnel syndrome 06/14/2012   Cammie Mcgee, PT, DPT # [id number] Resa Miner, SPT 12/19/2019, 12:47 PM  [facility] [address] [place], [place], [address] Phone: [phone]   Fax:  [phone]  Name: Nancy Sanchez MRN: [id number] Date of Birth: [birth year]

## 2019-12-23 ENCOUNTER — Encounter: Payer: BC Managed Care – PPO | Admitting: Physical Therapy

## 2019-12-23 ENCOUNTER — Encounter: Payer: Self-pay | Admitting: Physical Therapy

## 2019-12-24 ENCOUNTER — Other Ambulatory Visit: Payer: BC Managed Care – PPO | Admitting: Orthotics

## 2019-12-25 ENCOUNTER — Ambulatory Visit: Payer: BC Managed Care – PPO | Attending: Family Medicine | Admitting: Physical Therapy

## 2019-12-25 ENCOUNTER — Encounter: Payer: BC Managed Care – PPO | Admitting: Physical Therapy

## 2019-12-25 ENCOUNTER — Other Ambulatory Visit: Payer: Self-pay

## 2019-12-25 DIAGNOSIS — R269 Unspecified abnormalities of gait and mobility: Secondary | ICD-10-CM

## 2019-12-25 DIAGNOSIS — M6281 Muscle weakness (generalized): Secondary | ICD-10-CM | POA: Insufficient documentation

## 2019-12-25 DIAGNOSIS — M25552 Pain in left hip: Secondary | ICD-10-CM | POA: Insufficient documentation

## 2019-12-25 DIAGNOSIS — M25371 Other instability, right ankle: Secondary | ICD-10-CM | POA: Insufficient documentation

## 2019-12-29 NOTE — Therapy (Signed)
[facility] [address] [place], [place], [address] Phone: [phone]   Fax:  [phone]  Physical Therapy Treatment  Patient Details  Name: Nancy Sanchez MRN: [id number] Date of Birth: [birth year] Referring Provider (PT): Volney American, [place]   Encounter Date: 12/25/2019  PT End of Session - 12/29/19 0834    Visit Number  3    Number of Visits  5    Date for PT Re-Evaluation  01/08/20    Authorization - Visit Number  3    Authorization - Number of Visits  10    Activity Tolerance  Patient tolerated treatment well    Behavior During Therapy  [facility] for tasks assessed/performed       Past Medical History:  Diagnosis Date  . ADHD   . Allergy   . Anemia   . Anxiety   . Arthritis   . Asthma   . Barrett's esophagus   . Bulging lumbar disc    L5-S1  . Depression   . Endometriosis   . GERD (gastroesophageal reflux disease)   . Hernia, hiatal    DDD  . HPV in female   . Hypertension   . Migraine   . Osteoporosis     Past Surgical History:  Procedure Laterality Date  . ARTHROSCOPIC REPAIR ACL Left   . CARPAL TUNNEL RELEASE Bilateral   . ESSURE TUBAL LIGATION    . GASTRIC BYPASS    . HEEL SPUR EXCISION Left   . INGUINAL HERNIA REPAIR  12/18/2017  . JOINT REPLACEMENT Left    knee  . TONSILLECTOMY      There were no vitals filed for this visit.  Subjective Assessment - 12/29/19 0833    Subjective  PT sent information for pt. to be assessed for shoe orthotics ([employer]).  No PT tx. today.    Pertinent History  Pt. is a 49 yo female who is currently working as a Charity fundraiser at [employer]. Pt. is right handed dominant. Fall with outstretched arm on her left arm on 09/25/2019 (work related injury) which causes L shoulder pain (MRI shows full rotator cuff thickness tear; pt wants to avoid surgery); Pt. falls multiple times since December 2020 due to R ankle instability.    Limitations  Walking;Standing;House hold  activities    Patient Stated Goals  Increase L ankle strength/ improve gait/ prevent falls.    Pain Onset  More than a month ago            No PT tx. Session today.      PT Long Term Goals - 11/14/19 1537      PT LONG TERM GOAL #1   Title  Pt. will increase FOTO to 61 to improve pain-free ankle mobility/ walking.    Baseline  Current FOTO 49. Goal 61    Time  8    Period  Weeks    Status  New    Target Date  01/08/20      PT LONG TERM GOAL #2   Title  Pt. will increase MMT by 1/2 grade in hip flexion, hip abd, DF and IV bilaterally to improve gait independence.    Baseline  hip flexion (R 4/5, L 3+/5), hip ABD (R 4+/5, L 4+/5), DF (R 4/5, L 4+/5), IV (R 3+/5, L 4+/5)    Time  8    Period  Weeks    Status  New    Target Date  01/08/20  PT LONG TERM GOAL #3   Title  Pt. is able to ambulate with normalized gait pattern on outside surfaces with no loss of balance to improve safety/ independence with walking.    Baseline  Pt. ambulated with L antalgic gait with trunk lean L>R, narrowed BOS, decreased hp flexion, hip extension, guarded pelvic rotation and decreased push off during gait assessment    Time  8    Period  Weeks    Status  New    Target Date  01/08/20      PT LONG TERM GOAL #4   Title  Pt. will be able to perform ADLs and IADLs without c/o R ankle instability and pain.    Baseline  Pt. has 0/10 pain in her R ankle at the beginning of the first evaluation and c/o R ankle is rolling in/giving out during amubulation.    Time  8    Period  Weeks    Status  New    Target Date  01/08/20            Plan - 12/29/19 0834    Clinical Impression Statement  No PT tx. today.  Pt. will contact PT after orthotist appt. to discuss.  Pt. rescheduled appt. for 01/01/2020.    Examination-Activity Limitations  Squat;Lift    Stability/Clinical Decision Making  Evolving/Moderate complexity    Rehab Potential  Good    PT Frequency  Biweekly    PT Duration  8 weeks     PT Treatment/Interventions  ADLs/Self Care Home Management;Gait training;Functional mobility training;Therapeutic exercise;Neuromuscular re-education;Manual techniques;Energy conservation;Cryotherapy;Electrical Stimulation;Moist Heat;Stair training;Balance training;Orthotic Fit/Training;Patient/family education;Passive range of motion    PT Next Visit Plan  Reassess B great toe extension, DF, gait without shoes.  Check orthotics (discuss f/u appt. with [employer]).    PT Home Exercise Plan  Access Code: [email or web address]    Consulted and Agree with Plan of Care  Patient       Patient will benefit from skilled therapeutic intervention in order to improve the following deficits and impairments:  Abnormal gait, Decreased endurance, Decreased strength, Pain, Obesity, Difficulty walking, Decreased balance, Decreased mobility, Improper body mechanics, Decreased activity tolerance  Visit Diagnosis: Ankle instability, right  Gait difficulty  Pain in left hip  Muscle weakness (generalized)     Problem List Patient Active Problem List   Diagnosis Date Noted  . Vaginal irritation 11/13/2019  . Tinea corporis 09/16/2019  . At risk for sepsis 05/29/2019  . Impingement syndrome of right shoulder 05/04/2019  . Heart palpitations 04/17/2019  . History of PSVT (paroxysmal supraventricular tachycardia) 04/17/2019  . Degenerative tear of medial meniscus of right knee 04/08/2019  . Knee instability, right 04/07/2019  . Morbid obesity (HCC) 01/19/2019  . Sprain of calcaneofibular ligament of right ankle 12/12/2018  . Rotator cuff injury, initial encounter 12/10/2018  . S/P total knee arthroplasty, left 09/30/2018  . Dysuria 09/24/2018  . Abnormal uterine bleeding 05/16/2018  . Headache 05/16/2018  . Menorrhagia 05/16/2018  . Barrett's esophagus with dysplasia 04/07/2018  . Vitamin D deficiency 03/08/2018  . Vaginal low risk HPV DNA test positive 01/26/2018  . Left inguinal hernia 11/14/2017  . Posterior  tibial tendon dysfunction (PTTD) of right lower extremity 11/14/2017  . Infection of skin due to methicillin resistant Staphylococcus aureus (MRSA) 11/09/2017  . Abscess of abdominal wall 11/06/2017  . Vaginal yeast infection 09/21/2017  . Abscess of right foot 07/18/2017  . Mild episode of recurrent major depressive disorder (HCC) 06/27/2017  . Hidradenitis  axillaris 05/28/2017  . History of Roux-en-Y gastric bypass 05/01/2017  . Migraines 05/01/2017  . Anxiety   . Arthritis   . Asthma   . Depression   . GERD (gastroesophageal reflux disease)   . Hypertension   . Osteoporosis   . Iron deficiency anemia 02/27/2017  . Paroxysmal supraventricular tachycardia (HCC) 02/27/2017  . Generalized abdominal pain 04/19/2016  . Seasonal allergic rhinitis 01/04/2016  . Primary osteoarthritis of left hip 01/29/2015  . Dietary counseling and surveillance 01/04/2015  . S/P gastric bypass 01/04/2015  . Chronic hip pain 09/24/2014  . Primary osteoarthritis of one knee, left 09/24/2014  . Pain in joint, ankle and foot 08/19/2013  . Dyspnea 06/30/2013  . Edema 06/30/2013  . Shoulder pain 06/30/2013  . Tongue lesion 06/19/2013  . Acquired pes planus of both feet 05/19/2013  . Calcific Achilles tendonitis 05/19/2013  . Neck muscle spasm 02/17/2013  . Carpal tunnel syndrome 06/14/2012   Cammie Mcgee, PT, DPT # [phone] 12/29/2019, 8:36 AM  [facility] [address] [place], [place], [address] Phone: [phone]   Fax:  [phone]  Name: Nancy Sanchez MRN: [id number] Date of Birth: Jan 02, 1971

## 2019-12-30 ENCOUNTER — Encounter: Payer: BC Managed Care – PPO | Admitting: Physical Therapy

## 2020-01-01 ENCOUNTER — Ambulatory Visit: Payer: BC Managed Care – PPO | Admitting: Physical Therapy

## 2020-01-01 ENCOUNTER — Encounter: Payer: Self-pay | Admitting: Physical Therapy

## 2020-01-01 ENCOUNTER — Other Ambulatory Visit: Payer: Self-pay

## 2020-01-01 ENCOUNTER — Encounter: Payer: BC Managed Care – PPO | Admitting: Physical Therapy

## 2020-01-01 DIAGNOSIS — M25552 Pain in left hip: Secondary | ICD-10-CM | POA: Diagnosis not present

## 2020-01-01 DIAGNOSIS — M6281 Muscle weakness (generalized): Secondary | ICD-10-CM | POA: Diagnosis not present

## 2020-01-01 DIAGNOSIS — R269 Unspecified abnormalities of gait and mobility: Secondary | ICD-10-CM | POA: Diagnosis not present

## 2020-01-01 DIAGNOSIS — M25371 Other instability, right ankle: Secondary | ICD-10-CM | POA: Diagnosis not present

## 2020-01-01 NOTE — Therapy (Signed)
[facility] [address]. [place], [place], [address] Phone: [phone]   Fax:  [phone]  Physical Therapy Treatment  Patient Details  Name: Nancy Sanchez MRN: [id number] Date of Birth: February 14, 1971 Referring Provider (PT): Particia Nearing, [place]   Encounter Date: 01/01/2020  PT End of Session - 01/03/20 1351    Visit Number  4    Number of Visits  5    Date for PT Re-Evaluation  01/08/20    Authorization - Visit Number  4    Authorization - Number of Visits  10    PT Start Time  1658    PT Stop Time  1748    PT Time Calculation (min)  50 min    Activity Tolerance  Patient tolerated treatment well    Behavior During Therapy  [facility] for tasks assessed/performed       Past Medical History:  Diagnosis Date  . ADHD   . Allergy   . Anemia   . Anxiety   . Arthritis   . Asthma   . Barrett's esophagus   . Bulging lumbar disc    L5-S1  . Depression   . Endometriosis   . GERD (gastroesophageal reflux disease)   . Hernia, hiatal    DDD  . HPV in female   . Hypertension   . Migraine   . Osteoporosis     Past Surgical History:  Procedure Laterality Date  . ARTHROSCOPIC REPAIR ACL Left   . CARPAL TUNNEL RELEASE Bilateral   . ESSURE TUBAL LIGATION    . GASTRIC BYPASS    . HEEL SPUR EXCISION Left   . INGUINAL HERNIA REPAIR  12/18/2017  . JOINT REPLACEMENT Left    knee  . TONSILLECTOMY      There were no vitals filed for this visit.          Manual Therapy:  Supine manual R plantar fascia stretching 15s x 10 reps Supine R great toe extension stretching 8s x 15 reps Supine manual plantar fascia trigger point release 4 min   Therex:  Standing Arch Lifting - 20 reps  - 5 sec hold  Seated/Standing Plantar Fascia Mobilization with Small Ball, hold for 10 s, total of 5 min (standing and seated) Seated Heel Raise - 20 reps Forward gait pattern training x 5 laps within //-bars, forward ambulation in  hallway x 2 laps, verbal cueing and demonstration for exaggerated bilateral heel strike, great toe extension and plantarflexion HEP (see handout)      PT Long Term Goals - 11/14/19 1537      PT LONG TERM GOAL #1   Title  Pt. will increase FOTO to 61 to improve pain-free ankle mobility/ walking.    Baseline  Current FOTO 49. Goal 61    Time  8    Period  Weeks    Status  New    Target Date  01/08/20      PT LONG TERM GOAL #2   Title  Pt. will increase MMT by 1/2 grade in hip flexion, hip abd, DF and IV bilaterally to improve gait independence.    Baseline  hip flexion (R 4/5, L 3+/5), hip ABD (R 4+/5, L 4+/5), DF (R 4/5, L 4+/5), IV (R 3+/5, L 4+/5)    Time  8    Period  Weeks    Status  New    Target Date  01/08/20      PT LONG TERM GOAL #  3   Title  Pt. is able to ambulate with normalized gait pattern on outside surfaces with no loss of balance to improve safety/ independence with walking.    Baseline  Pt. ambulated with L antalgic gait with trunk lean L>R, narrowed BOS, decreased hp flexion, hip extension, guarded pelvic rotation and decreased push off during gait assessment    Time  8    Period  Weeks    Status  New    Target Date  01/08/20      PT LONG TERM GOAL #4   Title  Pt. will be able to perform ADLs and IADLs without c/o R ankle instability and pain.    Baseline  Pt. has 0/10 pain in her R ankle at the beginning of the first evaluation and c/o R ankle is rolling in/giving out during amubulation.    Time  8    Period  Weeks    Status  New    Target Date  01/08/20              Patient will benefit from skilled therapeutic intervention in order to improve the following deficits and impairments:     Visit Diagnosis: Gait difficulty  Ankle instability, right  Pain in left hip  Muscle weakness (generalized)     Problem List Patient Active Problem List   Diagnosis Date Noted  . Vaginal irritation 11/13/2019  . Tinea corporis 09/16/2019  . At  risk for sepsis 05/29/2019  . Impingement syndrome of right shoulder 05/04/2019  . Heart palpitations 04/17/2019  . History of PSVT (paroxysmal supraventricular tachycardia) 04/17/2019  . Degenerative tear of medial meniscus of right knee 04/08/2019  . Knee instability, right 04/07/2019  . Morbid obesity (HCC) 01/19/2019  . Sprain of calcaneofibular ligament of right ankle 12/12/2018  . Rotator cuff injury, initial encounter 12/10/2018  . S/P total knee arthroplasty, left 09/30/2018  . Dysuria 09/24/2018  . Abnormal uterine bleeding 05/16/2018  . Headache 05/16/2018  . Menorrhagia 05/16/2018  . Barrett's esophagus with dysplasia 04/07/2018  . Vitamin D deficiency 03/08/2018  . Vaginal low risk HPV DNA test positive 01/26/2018  . Left inguinal hernia 11/14/2017  . Posterior tibial tendon dysfunction (PTTD) of right lower extremity 11/14/2017  . Infection of skin due to methicillin resistant Staphylococcus aureus (MRSA) 11/09/2017  . Abscess of abdominal wall 11/06/2017  . Vaginal yeast infection 09/21/2017  . Abscess of right foot 07/18/2017  . Mild episode of recurrent major depressive disorder (HCC) 06/27/2017  . Hidradenitis axillaris 05/28/2017  . History of Roux-en-Y gastric bypass 05/01/2017  . Migraines 05/01/2017  . Anxiety   . Arthritis   . Asthma   . Depression   . GERD (gastroesophageal reflux disease)   . Hypertension   . Osteoporosis   . Iron deficiency anemia 02/27/2017  . Paroxysmal supraventricular tachycardia (HCC) 02/27/2017  . Generalized abdominal pain 04/19/2016  . Seasonal allergic rhinitis 01/04/2016  . Primary osteoarthritis of left hip 01/29/2015  . Dietary counseling and surveillance 01/04/2015  . S/P gastric bypass 01/04/2015  . Chronic hip pain 09/24/2014  . Primary osteoarthritis of one knee, left 09/24/2014  . Pain in joint, ankle and foot 08/19/2013  . Dyspnea 06/30/2013  . Edema 06/30/2013  . Shoulder pain 06/30/2013  . Tongue lesion  06/19/2013  . Acquired pes planus of both feet 05/19/2013  . Calcific Achilles tendonitis 05/19/2013  . Neck muscle spasm 02/17/2013  . Carpal tunnel syndrome 06/14/2012    Cammie Mcgee 01/03/2020, 1:53 PM  [facility] [address] [place], [place], [address] Phone: [phone]   Fax:  [phone]  Name: Nancy Sanchez MRN: [id number] Date of Birth: 19-Jan-1971

## 2020-01-06 ENCOUNTER — Encounter: Payer: BC Managed Care – PPO | Admitting: Physical Therapy

## 2020-01-08 ENCOUNTER — Encounter: Payer: BC Managed Care – PPO | Admitting: Physical Therapy

## 2020-01-19 DIAGNOSIS — F419 Anxiety disorder, unspecified: Secondary | ICD-10-CM | POA: Diagnosis not present

## 2020-01-19 DIAGNOSIS — F331 Major depressive disorder, recurrent, moderate: Secondary | ICD-10-CM | POA: Diagnosis not present

## 2020-01-19 DIAGNOSIS — Z79899 Other long term (current) drug therapy: Secondary | ICD-10-CM | POA: Diagnosis not present

## 2020-01-20 ENCOUNTER — Other Ambulatory Visit: Payer: Self-pay | Admitting: Nurse Practitioner

## 2020-01-20 NOTE — Telephone Encounter (Signed)
Requested Prescriptions  Pending Prescriptions Disp Refills  . busPIRone (BUSPAR) 5 MG tablet [Pharmacy Med Name: BUSPIRONE HCL 5 MG TABLET] 60 tablet 1    Sig: Take 1 tablet (5 mg total) by mouth as needed (Take twice a day as needed for increased anxiety.).     Psychiatry: Anxiolytics/Hypnotics - Non-controlled Passed - 01/20/2020  4:43 PM      Passed - Valid encounter within last 6 months    Recent Outpatient Visits          1 month ago Anxiety   [facility] Roosvelt Maser [place], [place]   2 months ago Other iron deficiency anemia   [facility] Mardene Celeste [place], NP   3 months ago Essential hypertension   [facility] Roosvelt Maser [place], [place]   4 months ago Tinea corporis   [facility] [place], [place] T, NP   7 months ago Encounter for annual physical exam   [facility], [place] T, NP             . escitalopram (LEXAPRO) 20 MG tablet [Pharmacy Med Name: ESCITALOPRAM 20 MG TABLET] 90 tablet 1    Sig: Take 1 tablet (20 mg total) by mouth daily.     Psychiatry:  Antidepressants - SSRI Passed - 01/20/2020  4:43 PM      Passed - Completed PHQ-2 or PHQ-9 in the last 360 days.      Passed - Valid encounter within last 6 months    Recent Outpatient Visits          1 month ago Anxiety   [facility] Roosvelt Maser [place], [place]   2 months ago Other iron deficiency anemia   [facility] Mardene Celeste [place], NP   3 months ago Essential hypertension   [facility] Roosvelt Maser [place], [place]   4 months ago Tinea corporis   [facility] [place], [place] T, NP   7 months ago Encounter for annual physical exam   [facility] Marjie Skiff, NP

## 2020-01-21 ENCOUNTER — Other Ambulatory Visit: Payer: BC Managed Care – PPO | Admitting: Orthotics

## 2020-01-27 ENCOUNTER — Telehealth (INDEPENDENT_AMBULATORY_CARE_PROVIDER_SITE_OTHER): Payer: BC Managed Care – PPO | Admitting: Family Medicine

## 2020-01-27 ENCOUNTER — Encounter: Payer: Self-pay | Admitting: Family Medicine

## 2020-01-27 VITALS — Wt 212.0 lb

## 2020-01-27 DIAGNOSIS — R300 Dysuria: Secondary | ICD-10-CM

## 2020-01-27 DIAGNOSIS — Z113 Encounter for screening for infections with a predominantly sexual mode of transmission: Secondary | ICD-10-CM

## 2020-01-27 LAB — WET PREP FOR TRICH, YEAST, CLUE
Clue Cell Exam: POSITIVE — AB
Trichomonas Exam: NEGATIVE
Yeast Exam: NEGATIVE

## 2020-01-27 NOTE — Progress Notes (Signed)
Wt 212 lb (96.2 kg)   BMI 38.78 kg/m    Subjective:    Patient ID: Nancy Sanchez, female    DOB: 1971-04-05, 49 y.o.   MRN: [id number]  HPI: Nancy Sanchez is a 49 y.o. female  Chief Complaint  Patient presents with  . Urinary Frequency  . Dysuria    vaginal irritation x weeks  . Abdominal Pain    lower abdomen pressure    . This visit was completed via MyChart due to the restrictions of the COVID-19 pandemic. All issues as above were discussed and addressed. Physical exam was done as above through visual confirmation on MyChart. If it was felt that the patient should be evaluated in the office, they were directed there. The patient verbally consented to this visit. . Location of the patient: home . Location of the provider: home . Those involved with this call:  . Provider: Merrie Roof, PA-C . CMA: Lesle Chris, [place] . Front Desk/Registration: Jill Side  . Time spent on call: 15 minutes with patient face to face via video conference. More than 50% of this time was spent in counseling and coordination of care. 5 minutes total spent in review of patient's record and preparation of their chart. I verified patient identity using two factors (patient name and date of birth). Patient consents verbally to being seen via telemedicine visit today.   Currently dealing with lower abdominal pressure, irritation in vaginal area, urinary frequency and hesitancy. Denies fevers, chills, discharge, N/V, rashes. Has not been trying anything OTC for this. Multiple new sexual partners recently.   Relevant past medical, surgical, family and social history reviewed and updated as indicated. Interim medical history since our last visit reviewed. Allergies and medications reviewed and updated.  Review of Systems  Per HPI unless specifically indicated above     Objective:    Wt 212 lb (96.2 kg)   BMI 38.78 kg/m   Wt Readings from Last 3 Encounters:  01/27/20 212 lb (96.2 kg)    12/19/19 210 lb 3 oz (95.3 kg)  11/13/19 220 lb (99.8 kg)    Physical Exam Vitals and nursing note reviewed.  Constitutional:      General: She is not in acute distress.    Appearance: Normal appearance.  HENT:     Head: Atraumatic.     Right Ear: External ear normal.     Left Ear: External ear normal.     Nose: Nose normal. No congestion.     Mouth/Throat:     Mouth: Mucous membranes are moist.     Pharynx: Oropharynx is clear. No posterior oropharyngeal erythema.  Eyes:     Extraocular Movements: Extraocular movements intact.     Conjunctiva/sclera: Conjunctivae normal.  Cardiovascular:     Comments: Unable to assess via virtual visit Pulmonary:     Effort: Pulmonary effort is normal. No respiratory distress.  Musculoskeletal:        General: Normal range of motion.     Cervical back: Normal range of motion.  Skin:    General: Skin is dry.     Findings: No erythema.  Neurological:     Mental Status: She is alert and oriented to person, place, and time.  Psychiatric:        Mood and Affect: Mood normal.        Thought Content: Thought content normal.        Judgment: Judgment normal.     Results for orders placed or performed  in visit on 01/27/20  WET PREP FOR TRICH, YEAST, CLUE   Specimen: Urine   URINE  Result Value Ref Range   Trichomonas Exam Negative Negative   Yeast Exam Negative Negative   Clue Cell Exam Positive (A) Negative  GC/Chlamydia Probe Amp   Specimen: Urine   UR  Result Value Ref Range   Chlamydia trachomatis, NAA Negative Negative   Neisseria Gonorrhoeae by PCR Negative Negative  Microscopic Examination   URINE  Result Value Ref Range   WBC, UA 0-5 0 - 5 /hpf   RBC 11-30 (A) 0 - 2 /hpf   Epithelial Cells (non renal) 0-10 0 - 10 /hpf   Bacteria, UA None seen None seen/Few  Urine Culture, Reflex   URINE  Result Value Ref Range   Urine Culture, Routine Final report (A)    Organism ID, Bacteria Comment (A)    Antimicrobial  Susceptibility Comment   UA/M w/rflx Culture, Routine   Specimen: Urine   URINE  Result Value Ref Range   Specific Gravity, UA 1.010 1.005 - 1.030   pH, UA 5.0 5.0 - 7.5   Color, UA Yellow Yellow   Appearance Ur Hazy (A) Clear   Leukocytes,UA Trace (A) Negative   Protein,UA Negative Negative/Trace   Glucose, UA Negative Negative   Ketones, UA Negative Negative   RBC, UA 3+ (A) Negative   Bilirubin, UA Negative Negative   Urobilinogen, Ur 0.2 0.2 - 1.0 mg/dL   Nitrite, UA Negative Negative   Microscopic Examination See below:    Urinalysis Reflex Comment   HIV Antibody (routine testing w rflx)  Result Value Ref Range   HIV Screen 4th Generation wRfx Non Reactive Non Reactive  RPR  Result Value Ref Range   RPR Ser Ql Non Reactive Non Reactive  HSV(herpes simplex vrs) 1+2 ab-IgG  Result Value Ref Range   HSV 1 Glycoprotein G Ab, IgG 1.19 (H) 0.00 - 0.90 index   HSV 2 IgG, Type Spec 6.64 (H) 0.00 - 0.90 index      Assessment & Plan:   Problem List Items Addressed This Visit      Other   Dysuria - Primary    Will check U/A, STI panel, wet prep. Treat based on results. Discussed safe sexual practices, vaginal hygiene      Relevant Orders   UA/M w/rflx Culture, Routine (Completed)   WET PREP FOR TRICH, YEAST, CLUE (Completed)   GC/Chlamydia Probe Amp (Completed)    Other Visit Diagnoses    Routine screening for STI (sexually transmitted infection)       Relevant Orders   HIV Antibody (routine testing w rflx) (Completed)   RPR (Completed)   HSV(herpes simplex vrs) 1+2 ab-IgG (Completed)   GC/Chlamydia Probe Amp (Completed)       Follow up plan: Return if symptoms worsen or fail to improve.

## 2020-01-28 ENCOUNTER — Other Ambulatory Visit: Payer: Self-pay | Admitting: Family Medicine

## 2020-01-28 LAB — GC/CHLAMYDIA PROBE AMP
Chlamydia trachomatis, NAA: NEGATIVE
Neisseria Gonorrhoeae by PCR: NEGATIVE

## 2020-01-28 LAB — RPR: RPR Ser Ql: NONREACTIVE

## 2020-01-28 LAB — HIV ANTIBODY (ROUTINE TESTING W REFLEX): HIV Screen 4th Generation wRfx: NONREACTIVE

## 2020-01-28 LAB — HSV(HERPES SIMPLEX VRS) I + II AB-IGG
HSV 1 Glycoprotein G Ab, IgG: 1.19 index — ABNORMAL HIGH (ref 0.00–0.90)
HSV 2 IgG, Type Spec: 6.64 index — ABNORMAL HIGH (ref 0.00–0.90)

## 2020-01-28 MED ORDER — MUPIROCIN 2 % EX OINT: 1.0000 "application " | TOPICAL_OINTMENT | Freq: Two times a day (BID) | CUTANEOUS | 0 refills | Status: DC

## 2020-01-28 MED ORDER — CLINDAMYCIN HCL 150 MG PO CAPS: 150.0000 mg | ORAL_CAPSULE | Freq: Two times a day (BID) | ORAL | 0 refills | Status: DC

## 2020-01-31 LAB — UA/M W/RFLX CULTURE, ROUTINE
Bilirubin, UA: NEGATIVE
Glucose, UA: NEGATIVE
Ketones, UA: NEGATIVE
Nitrite, UA: NEGATIVE
Protein,UA: NEGATIVE
Specific Gravity, UA: 1.01 (ref 1.005–1.030)
Urobilinogen, Ur: 0.2 mg/dL (ref 0.2–1.0)
pH, UA: 5 (ref 5.0–7.5)

## 2020-01-31 LAB — MICROSCOPIC EXAMINATION: Bacteria, UA: NONE SEEN

## 2020-01-31 LAB — URINE CULTURE, REFLEX

## 2020-02-01 NOTE — Assessment & Plan Note (Signed)
Will check U/A, STI panel, wet prep. Treat based on results. Discussed safe sexual practices, vaginal hygiene

## 2020-02-02 ENCOUNTER — Other Ambulatory Visit: Payer: Self-pay | Admitting: Family Medicine

## 2020-02-02 MED ORDER — NITROFURANTOIN MONOHYD MACRO 100 MG PO CAPS: 100.0000 mg | ORAL_CAPSULE | Freq: Two times a day (BID) | ORAL | 0 refills | Status: DC

## 2020-02-03 ENCOUNTER — Ambulatory Visit: Payer: BC Managed Care – PPO | Admitting: Podiatry

## 2020-02-04 ENCOUNTER — Other Ambulatory Visit: Payer: Self-pay | Admitting: Nurse Practitioner

## 2020-02-04 NOTE — Telephone Encounter (Signed)
Requested medication (s) are due for refill today:   Not sure  Requested medication (s) are on the active medication list:   Yes  Future visit scheduled:   No   Last ordered: 09/16/2019  15 g  4 refills  Clinic note:   No protocol for this medication.   Requested Prescriptions  Pending Prescriptions Disp Refills   nystatin (MYCOSTATIN/NYSTOP) powder [Pharmacy Med Name: NYSTATIN 100,000 UNIT/GM POWD] 15 g 0    Sig: Apply topically 3 (three) times daily.      Off-Protocol Failed - 02/04/2020 11:20 AM      Failed - Medication not assigned to a protocol, review manually.      Passed - Valid encounter within last 12 months    Recent Outpatient Visits           1 week ago Dysuria   [facility] Particia Nearing, [place]   1 month ago Anxiety   [facility] Roosvelt Maser [place], [place]   2 months ago Other iron deficiency anemia   [facility] Mardene Celeste I, NP   3 months ago Essential hypertension   [facility] Roosvelt Maser [place], [place]   4 months ago Tinea corporis   [facility] [place], Dorie Rank, NP

## 2020-02-11 ENCOUNTER — Encounter: Payer: Self-pay | Admitting: Family Medicine

## 2020-02-12 ENCOUNTER — Other Ambulatory Visit: Payer: Self-pay | Admitting: Nurse Practitioner

## 2020-02-12 ENCOUNTER — Other Ambulatory Visit: Payer: Self-pay | Admitting: Family Medicine

## 2020-02-12 MED ORDER — QUETIAPINE FUMARATE 50 MG PO TABS: 50.0000 mg | ORAL_TABLET | Freq: Every day | ORAL | 0 refills | Status: DC

## 2020-02-13 MED ORDER — NYSTATIN 100000 UNIT/GM EX POWD: Freq: Three times a day (TID) | CUTANEOUS | 0 refills | Status: DC

## 2020-02-16 DIAGNOSIS — F419 Anxiety disorder, unspecified: Secondary | ICD-10-CM | POA: Diagnosis not present

## 2020-02-16 DIAGNOSIS — F331 Major depressive disorder, recurrent, moderate: Secondary | ICD-10-CM | POA: Diagnosis not present

## 2020-02-19 ENCOUNTER — Ambulatory Visit: Payer: BC Managed Care – PPO | Admitting: Podiatry

## 2020-02-20 ENCOUNTER — Other Ambulatory Visit: Payer: Self-pay | Admitting: Nurse Practitioner

## 2020-02-20 DIAGNOSIS — M2140 Flat foot [pes planus] (acquired), unspecified foot: Secondary | ICD-10-CM | POA: Diagnosis not present

## 2020-02-20 NOTE — Telephone Encounter (Signed)
Requested Prescriptions  Pending Prescriptions Disp Refills  . omeprazole (PRILOSEC) 40 MG capsule [Pharmacy Med Name: OMEPRAZOLE DR 40 MG CAPSULE] 180 capsule 1    Sig: Take 1 capsule (40 mg total) by mouth 2 (two) times daily before a meal.     Gastroenterology: Proton Pump Inhibitors Passed - 02/20/2020 12:47 PM      Passed - Valid encounter within last 12 months    Recent Outpatient Visits          3 weeks ago Dysuria   [facility] [place] Particia Nearing, [place]   2 months ago Anxiety   [facility] Roosvelt Maser [place], [place]   3 months ago Other iron deficiency anemia   [facility] Mardene Celeste I, NP   4 months ago Essential hypertension   [facility] Roosvelt Maser [place], [place]   5 months ago Tinea corporis   [facility] Marjie Skiff, NP      Future Appointments            In 3 days Maurice March, Salley Hews, PA-C [facility]

## 2020-02-23 ENCOUNTER — Ambulatory Visit: Payer: BC Managed Care – PPO | Admitting: Family Medicine

## 2020-02-23 ENCOUNTER — Encounter: Payer: Self-pay | Admitting: Family Medicine

## 2020-02-23 ENCOUNTER — Other Ambulatory Visit: Payer: Self-pay

## 2020-02-23 VITALS — BP 109/74 | HR 65 | Temp 98.8°F | Wt 209.4 lb

## 2020-02-23 DIAGNOSIS — L0292 Furuncle, unspecified: Secondary | ICD-10-CM

## 2020-02-23 DIAGNOSIS — G4700 Insomnia, unspecified: Secondary | ICD-10-CM

## 2020-02-23 DIAGNOSIS — B372 Candidiasis of skin and nail: Secondary | ICD-10-CM | POA: Diagnosis not present

## 2020-02-23 MED ORDER — NYSTATIN 100000 UNIT/GM EX POWD: Freq: Three times a day (TID) | CUTANEOUS | 5 refills | Status: DC

## 2020-02-23 MED ORDER — NYSTATIN 100000 UNIT/GM EX CREA: 1.0000 "application " | TOPICAL_CREAM | Freq: Two times a day (BID) | CUTANEOUS | 5 refills | Status: DC

## 2020-02-23 NOTE — Progress Notes (Signed)
BP 109/74   Pulse 65   Temp 98.8 F (37.1 C)   Wt 209 lb 6 oz (95 kg)   SpO2 99%   BMI 38.30 kg/m    Subjective:    Patient ID: Nancy Sanchez, female    DOB: April 04, 1971, 49 y.o.   MRN: [id number]  HPI: Nancy Sanchez is a 49 y.o. female  Chief Complaint  Patient presents with  . Insomnia  . Rash    under stomach, would like a refill on nystatin   Presenting today for 1 month insomnia f/u after adding seroquel. Working very well for her. States her Psychiatrist has agreed to manage this medication here on out, and it has been increased to 75 mg nightly which seems to work well for sleep without making her groggy.   Still dealing with chronic yeast infections under breasts and pannus, nystatin cream and powder seem to work well but she's having to use frequently and runs out quickly.   Has a boil lower abdomen for about a week. Draining some, red and itchy. Keeping clean and using triple antibiotic ointment. Denies fever, chills, sweats.   Relevant past medical, surgical, family and social history reviewed and updated as indicated. Interim medical history since our last visit reviewed. Allergies and medications reviewed and updated.  Review of Systems  Per HPI unless specifically indicated above     Objective:    BP 109/74   Pulse 65   Temp 98.8 F (37.1 C)   Wt 209 lb 6 oz (95 kg)   SpO2 99%   BMI 38.30 kg/m   Wt Readings from Last 3 Encounters:  02/23/20 209 lb 6 oz (95 kg)  01/27/20 212 lb (96.2 kg)  12/19/19 210 lb 3 oz (95.3 kg)    Physical Exam Vitals and nursing note reviewed.  Constitutional:      Appearance: Normal appearance. She is not ill-appearing.  HENT:     Head: Atraumatic.  Eyes:     Extraocular Movements: Extraocular movements intact.     Conjunctiva/sclera: Conjunctivae normal.  Cardiovascular:     Rate and Rhythm: Normal rate and regular rhythm.     Heart sounds: Normal heart sounds.  Pulmonary:     Effort: Pulmonary  effort is normal.     Breath sounds: Normal breath sounds.  Musculoskeletal:        General: Normal range of motion.     Cervical back: Normal range of motion and neck supple.  Skin:    General: Skin is warm and dry.     Findings: Erythema (macerated erythematous rash under breasts and pannus) present.     Comments: Mildly erythematous boil present center of lower abdomen in fold under pannus, no active drainage  Neurological:     Mental Status: She is alert and oriented to person, place, and time.  Psychiatric:        Mood and Affect: Mood normal.        Thought Content: Thought content normal.        Judgment: Judgment normal.     Results for orders placed or performed in visit on 01/27/20  WET PREP FOR TRICH, YEAST, CLUE   Specimen: Urine   URINE  Result Value Ref Range   Trichomonas Exam Negative Negative   Yeast Exam Negative Negative   Clue Cell Exam Positive (A) Negative  GC/Chlamydia Probe Amp   Specimen: Urine   UR  Result Value Ref Range   Chlamydia trachomatis, NAA Negative  Negative   Neisseria Gonorrhoeae by PCR Negative Negative  Microscopic Examination   URINE  Result Value Ref Range   WBC, UA 0-5 0 - 5 /hpf   RBC 11-30 (A) 0 - 2 /hpf   Epithelial Cells (non renal) 0-10 0 - 10 /hpf   Bacteria, UA None seen None seen/Few  Urine Culture, Reflex   URINE  Result Value Ref Range   Urine Culture, Routine Final report (A)    Organism ID, Bacteria Comment (A)    Antimicrobial Susceptibility Comment   UA/M w/rflx Culture, Routine   Specimen: Urine   URINE  Result Value Ref Range   Specific Gravity, UA 1.010 1.005 - 1.030   pH, UA 5.0 5.0 - 7.5   Color, UA Yellow Yellow   Appearance Ur Hazy (A) Clear   Leukocytes,UA Trace (A) Negative   Protein,UA Negative Negative/Trace   Glucose, UA Negative Negative   Ketones, UA Negative Negative   RBC, UA 3+ (A) Negative   Bilirubin, UA Negative Negative   Urobilinogen, Ur 0.2 0.2 - 1.0 mg/dL   Nitrite, UA Negative  Negative   Microscopic Examination See below:    Urinalysis Reflex Comment   HIV Antibody (routine testing w rflx)  Result Value Ref Range   HIV Screen 4th Generation wRfx Non Reactive Non Reactive  RPR  Result Value Ref Range   RPR Ser Ql Non Reactive Non Reactive  HSV(herpes simplex vrs) 1+2 ab-IgG  Result Value Ref Range   HSV 1 Glycoprotein G Ab, IgG 1.19 (H) 0.00 - 0.90 index   HSV 2 IgG, Type Spec 6.64 (H) 0.00 - 0.90 index      Assessment & Plan:   Problem List Items Addressed This Visit    None    Visit Diagnoses    Insomnia, unspecified type    -  Primary   Improved on seroquel, now being managed by Psychiatrist. Continue per their recommendations at increased dose   Cutaneous candidiasis       Continue treating with nystatin cream and powder, keep areas clean and dry   Relevant Medications   nystatin (MYCOSTATIN/NYSTOP) powder   nystatin cream (MYCOSTATIN)   Boil       Mild, will treat with mupirocin ointment, warm compresses, keep area clean and dry. Abx if worsening   Relevant Medications   nystatin (MYCOSTATIN/NYSTOP) powder   nystatin cream (MYCOSTATIN)       Follow up plan: Return if symptoms worsen or fail to improve.

## 2020-03-02 ENCOUNTER — Other Ambulatory Visit: Payer: Self-pay | Admitting: Nurse Practitioner

## 2020-03-04 ENCOUNTER — Encounter: Payer: Self-pay | Admitting: Family Medicine

## 2020-03-09 ENCOUNTER — Other Ambulatory Visit: Payer: BC Managed Care – PPO

## 2020-03-09 ENCOUNTER — Other Ambulatory Visit: Payer: Self-pay

## 2020-03-09 ENCOUNTER — Other Ambulatory Visit: Payer: Self-pay | Admitting: Family Medicine

## 2020-03-09 DIAGNOSIS — M79642 Pain in left hand: Secondary | ICD-10-CM

## 2020-03-09 DIAGNOSIS — D508 Other iron deficiency anemias: Secondary | ICD-10-CM | POA: Diagnosis not present

## 2020-03-09 DIAGNOSIS — M79641 Pain in right hand: Secondary | ICD-10-CM

## 2020-03-10 LAB — CBC WITH DIFFERENTIAL/PLATELET
Basophils Absolute: 0.1 10*3/uL (ref 0.0–0.2)
Basos: 1 %
EOS (ABSOLUTE): 0.2 10*3/uL (ref 0.0–0.4)
Eos: 3 %
Hematocrit: 40.7 % (ref 34.0–46.6)
Hemoglobin: 12.9 g/dL (ref 11.1–15.9)
Immature Grans (Abs): 0 10*3/uL (ref 0.0–0.1)
Immature Granulocytes: 0 %
Lymphocytes Absolute: 1.8 10*3/uL (ref 0.7–3.1)
Lymphs: 26 %
MCH: 29.5 pg (ref 26.6–33.0)
MCHC: 31.7 g/dL (ref 31.5–35.7)
MCV: 93 fL (ref 79–97)
Monocytes Absolute: 0.6 10*3/uL (ref 0.1–0.9)
Monocytes: 8 %
Neutrophils Absolute: 4.3 10*3/uL (ref 1.4–7.0)
Neutrophils: 62 %
Platelets: 288 10*3/uL (ref 150–450)
RBC: 4.38 x10E6/uL (ref 3.77–5.28)
RDW: 11.8 % (ref 11.7–15.4)
WBC: 7 10*3/uL (ref 3.4–10.8)

## 2020-03-10 LAB — ANA W/REFLEX: Anti Nuclear Antibody (ANA): NEGATIVE

## 2020-03-10 LAB — FERRITIN: Ferritin: 17 ng/mL (ref 15–150)

## 2020-03-10 LAB — SEDIMENTATION RATE: Sed Rate: 2 mm/hr (ref 0–32)

## 2020-03-10 LAB — RHEUMATOID FACTOR: Rheumatoid fact SerPl-aCnc: <10 IU/mL (ref 0.0–13.9)

## 2020-03-10 LAB — URIC ACID: Uric Acid: 7 mg/dL — ABNORMAL HIGH (ref 2.6–6.2)

## 2020-03-12 ENCOUNTER — Other Ambulatory Visit: Payer: Self-pay

## 2020-03-14 MED ORDER — ONDANSETRON 4 MG PO TBDP: 4.0000 mg | ORAL_TABLET | Freq: Three times a day (TID) | ORAL | 0 refills | Status: DC | PRN

## 2020-03-15 ENCOUNTER — Other Ambulatory Visit: Payer: Self-pay

## 2020-03-15 MED ORDER — CYCLOBENZAPRINE HCL 5 MG PO TABS: 5.0000 mg | ORAL_TABLET | Freq: Two times a day (BID) | ORAL | 0 refills | Status: DC | PRN

## 2020-03-15 NOTE — Telephone Encounter (Signed)
Routing to provider  

## 2020-03-18 ENCOUNTER — Ambulatory Visit: Payer: BC Managed Care – PPO | Admitting: Podiatry

## 2020-03-23 DIAGNOSIS — Z20822 Contact with and (suspected) exposure to covid-19: Secondary | ICD-10-CM | POA: Diagnosis not present

## 2020-03-23 DIAGNOSIS — F419 Anxiety disorder, unspecified: Secondary | ICD-10-CM | POA: Diagnosis not present

## 2020-03-23 DIAGNOSIS — Z01818 Encounter for other preprocedural examination: Secondary | ICD-10-CM | POA: Diagnosis not present

## 2020-03-23 DIAGNOSIS — F331 Major depressive disorder, recurrent, moderate: Secondary | ICD-10-CM | POA: Diagnosis not present

## 2020-03-23 HISTORY — PX: SHOULDER ARTHROSCOPY W/ LABRAL REPAIR: SHX2399

## 2020-04-22 DIAGNOSIS — L309 Dermatitis, unspecified: Secondary | ICD-10-CM | POA: Diagnosis not present

## 2020-04-22 DIAGNOSIS — B351 Tinea unguium: Secondary | ICD-10-CM | POA: Diagnosis not present

## 2020-04-28 ENCOUNTER — Encounter: Payer: Self-pay | Admitting: Family Medicine

## 2020-04-29 ENCOUNTER — Encounter: Payer: Self-pay | Admitting: Nurse Practitioner

## 2020-04-29 ENCOUNTER — Ambulatory Visit (INDEPENDENT_AMBULATORY_CARE_PROVIDER_SITE_OTHER): Payer: BC Managed Care – PPO | Admitting: Nurse Practitioner

## 2020-04-29 ENCOUNTER — Other Ambulatory Visit: Payer: Self-pay

## 2020-04-29 VITALS — BP 117/74 | HR 66 | Temp 98.5°F | Ht 62.0 in | Wt 214.0 lb

## 2020-04-29 DIAGNOSIS — L259 Unspecified contact dermatitis, unspecified cause: Secondary | ICD-10-CM | POA: Diagnosis not present

## 2020-04-29 NOTE — Patient Instructions (Signed)
Contact Dermatitis Dermatitis is redness, soreness, and swelling (inflammation) of the skin. Contact dermatitis is a reaction to certain substances that touch the skin. Many different substances can cause contact dermatitis. There are two types of contact dermatitis:  Irritant contact dermatitis. This type is caused by something that irritates your skin, such as having dry hands from washing them too often with soap. This type does not require previous exposure to the substance for a reaction to occur. This is the most common type.  Allergic contact dermatitis. This type is caused by a substance that you are allergic to, such as poison ivy. This type occurs when you have been exposed to the substance (allergen) and develop a sensitivity to it. Dermatitis may develop soon after your first exposure to the allergen, or it may not develop until the next time you are exposed and every time thereafter. What are the causes? Irritant contact dermatitis is most commonly caused by exposure to:  Makeup.  Soaps.  Detergents.  Bleaches.  Acids.  Metal salts, such as nickel. Allergic contact dermatitis is most commonly caused by exposure to:  Poisonous plants.  Chemicals.  Jewelry.  Latex.  Medicines.  Preservatives in products, such as clothing. What increases the risk? You are more likely to develop this condition if you have:  A job that exposes you to irritants or allergens.  Certain medical conditions, such as asthma or eczema. What are the signs or symptoms? Symptoms of this condition may occur on your body anywhere the irritant has touched you or is touched by you.  Symptoms include: ? Dryness or flaking. ? Redness. ? Cracks. ? Itching. ? Pain or a burning feeling. ? Blisters. ? Drainage of small amounts of blood or clear fluid from skin cracks. With allergic contact dermatitis, there may also be swelling in areas such as the eyelids, mouth, or genitals. How is this  diagnosed? This condition is diagnosed with a medical history and physical exam.  A patch skin test may be performed to help determine the cause.  If the condition is related to your job, you may need to see an occupational medicine specialist. How is this treated? This condition is treated by checking for the cause of the reaction and protecting your skin from further contact. Treatment may also include:  Steroid creams or ointments. Oral steroid medicines may be needed in more severe cases.  Antibiotic medicines or antibacterial ointments, if a skin infection is present.  Antihistamine lotion or an antihistamine taken by mouth to ease itching.  A bandage (dressing). Follow these instructions at home: Skin care  Moisturize your skin as needed.  Apply cool compresses to the affected areas.  Try applying baking soda paste to your skin. Stir water into baking soda until it reaches a paste-like consistency.  Do not scratch your skin, and avoid friction to the affected area.  Avoid the use of soaps, perfumes, and dyes. Medicines  Take or apply over-the-counter and prescription medicines only as told by your health care provider.  If you were prescribed an antibiotic medicine, take or apply the antibiotic as told by your health care provider. Do not stop using the antibiotic even if your condition improves. Bathing  Try taking a bath with: ? Epsom salts. Follow the instructions on the packaging. You can get these at your local pharmacy or grocery store. ? Baking soda. Pour a small amount into the bath as directed by your health care provider. ? Colloidal oatmeal. Follow the instructions on the   packaging. You can get this at your local pharmacy or grocery store.  Bathe less frequently, such as every other day.  Bathe in lukewarm water. Avoid using hot water. Bandage care  If you were given a bandage (dressing), change it as told by your health care provider.  Wash your hands  with soap and water before and after you change your dressing. If soap and water are not available, use hand sanitizer. General instructions  Avoid the substance that caused your reaction. If you do not know what caused it, keep a journal to try to track what caused it. Write down: ? What you eat. ? What cosmetic products you use. ? What you drink. ? What you wear in the affected area. This includes jewelry.  Check the affected areas every day for signs of infection. Check for: ? More redness, swelling, or pain. ? More fluid or blood. ? Warmth. ? Pus or a bad smell.  Keep all follow-up visits as told by your health care provider. This is important. Contact a health care provider if:  Your condition does not improve with treatment.  Your condition gets worse.  You have signs of infection such as swelling, tenderness, redness, soreness, or warmth in the affected area.  You have a fever.  You have new symptoms. Get help right away if:  You have a severe headache, neck pain, or neck stiffness.  You vomit.  You feel very sleepy.  You notice red streaks coming from the affected area.  Your bone or joint underneath the affected area becomes painful after the skin has healed.  The affected area turns darker.  You have difficulty breathing. Summary  Dermatitis is redness, soreness, and swelling (inflammation) of the skin. Contact dermatitis is a reaction to certain substances that touch the skin.  Symptoms of this condition may occur on your body anywhere the irritant has touched you or is touched by you.  This condition is treated by figuring out what caused the reaction and protecting your skin from further contact. Treatment may also include medicines and skin care.  Avoid the substance that caused your reaction. If you do not know what caused it, keep a journal to try to track what caused it.  Contact a health care provider if your condition gets worse or you have signs  of infection such as swelling, tenderness, redness, soreness, or warmth in the affected area. This information is not intended to replace advice given to you by your health care provider. Make sure you discuss any questions you have with your health care provider. Document Revised: 01/29/2019 Document Reviewed: 04/24/2018 Elsevier Patient Education  2020 [employer]  

## 2020-04-29 NOTE — Progress Notes (Signed)
BP 117/74 (BP Location: Left Arm, Patient Position: Sitting, Cuff Size: Normal)   Pulse 66   Temp 98.5 F (36.9 C) (Oral)   Ht 5\' 2"  (1.575 m)   Wt 214 lb (97.1 kg)   SpO2 97%   BMI 39.14 kg/m    Subjective:    Patient ID: , female    DOB: 1971/01/15, 49 y.o.   MRN: [id number]  HPI: Nancy Sanchez is a 49 y.o. female presenting for rash.  Chief Complaint  Patient presents with  . Rash    Rash on leg, ongoing 4 days   RASH Rash to bilateral tops of toes started on 04/17/20 after she walked to the mailbox; rash later progressed to middle of feet.  Went to the foot doctor on 04/22/20 and they gave her steroid ointment - fluticasone 0.005%.  On 04/26/20, went outside and felt a ping to her left calf.  A rash started to developed up her left leg after this.  Rash on foot has been getting better.  Has had cellulitis in the past and is worried that this could be early cellulitis Duration:  days  Location: left leg  Itching: no Burning: no Redness: yes - small red bumps Oozing: no Scaling: no Blisters: no Painful: no Fevers: no Change in detergents/soaps/personal care products: no Recent illness: no Recent travel:no History of same: no Context: same Alleviating factors: nothing Treatments attempted: fluticasone cream, nystatin Shortness of breath: no  Throat/tongue swelling: no Myalgias/arthralgias: no  Allergies  Allergen Reactions  . Metronidazole Hives, Itching and Rash  . Oxycodone-Acetaminophen Nausea And Vomiting    Other reaction(s): Nausea And Vomiting nausea nausea  . Prednisone Anxiety    Other reaction(s): Other (See Comments) "agitated"  . Tape Rash  . Aspirin     Unable to take aspirin products since RNY Gastric Bypass  . Codeine     Other reaction(s): Other (See Comments)  . Nickel Itching    Itching from earrings -unsure of composition  . Nsaids Other (See Comments)    Gastric bypass  . Other Itching    Itching from  earrings -unsure of composition  . Oxycodone Other (See Comments)    Other reaction(s): Vomiting  . Fluticasone-Salmeterol Anxiety    Other Reaction: jittery Other Reaction: jittery Other Reaction: jittery Other Reaction: jittery Other Reaction: jittery  . Latex Rash    Other Reaction: Rash on hands only Other Reaction: Rash on hands only  . Morphine Anxiety and Itching    Does well with dilaudid and toradol Does well with dilaudid and toradol   Outpatient Encounter Medications as of 04/29/2020  Medication Sig Note  . acetaminophen (TYLENOL) 650 MG CR tablet Take by mouth. 03/14/2019: As needed  . albuterol (PROVENTIL) (2.5 MG/3ML) 0.083% nebulizer solution Take 3 mLs (2.5 mg total) by nebulization every 6 (six) hours as needed for wheezing or shortness of breath.   03/16/2019 albuterol (VENTOLIN HFA) 108 (90 Base) MCG/ACT inhaler 1-2 puffs by inhalation 4 times a day when needed   . BIOTIN PO Take by mouth.   . budesonide-formoterol (SYMBICORT) 160-4.5 MCG/ACT inhaler Inhale 2 puffs into the lungs 2 (two) times daily.   Marland Kitchen buPROPion (WELLBUTRIN XL) 150 MG 24 hr tablet Take 150 mg by mouth every morning.   Marland Kitchen buPROPion (WELLBUTRIN XL) 300 MG 24 hr tablet Take 1 tablet (300 mg total) by mouth daily.   . busPIRone (BUSPAR) 5 MG tablet Take 1 tablet (5 mg total) by mouth as  needed (Take twice a day as needed for increased anxiety.).   Marland Kitchen. Cholecalciferol (D3-50) 50000 units capsule Take by mouth. Once a week   . cyclobenzaprine (FLEXERIL) 5 MG tablet Take 1 tablet (5 mg total) by mouth 2 (two) times daily as needed for muscle spasms.   Marland Kitchen. escitalopram (LEXAPRO) 20 MG tablet Take 1 tablet (20 mg total) by mouth daily.   . ferrous sulfate 324 (65 Fe) MG TBEC Take by mouth daily.   . fexofenadine (ALLEGRA) 180 MG tablet Take 1 tablet (180 mg total) by mouth daily.   . fluticasone (FLONASE) 50 MCG/ACT nasal spray Place into the nose as needed.    . gabapentin (NEURONTIN) 300 MG capsule Take 2 capsules (600  mg total) by mouth 2 (two) times daily.   Marland Kitchen. ketorolac (TORADOL) 10 MG tablet Take 10 mg by mouth every 6 (six) hours as needed.   . magnesium oxide (MAG-OX) 400 MG tablet Take 400 mg by mouth 2 (two) times daily.   . meclizine (ANTIVERT) 25 MG tablet Take by mouth as needed.    . montelukast (SINGULAIR) 10 MG tablet Take 1 tablet (10 mg total) by mouth daily.   . mupirocin ointment (BACTROBAN) 2 % Place 1 application into the nose 2 (two) times daily.   Marland Kitchen. nystatin (MYCOSTATIN/NYSTOP) powder Apply topically 3 (three) times daily.   Marland Kitchen. nystatin cream (MYCOSTATIN) Apply 1 application topically 2 (two) times daily.   . Omega-3 1000 MG CAPS Take by mouth.   Marland Kitchen. omeprazole (PRILOSEC) 40 MG capsule Take 1 capsule (40 mg total) by mouth 2 (two) times daily before a meal.   . ondansetron (ZOFRAN ODT) 4 MG disintegrating tablet Take 1 tablet (4 mg total) by mouth every 8 (eight) hours as needed for nausea or vomiting.   . propranolol (INDERAL) 20 MG tablet Take 2 tablets (40 mg total) by mouth 2 (two) times daily.   . QUEtiapine (SEROQUEL) 25 MG tablet Take 25 mg by mouth at bedtime.   Marland Kitchen. QUEtiapine (SEROQUEL) 50 MG tablet Take 1 tablet (50 mg total) by mouth at bedtime.   . rizatriptan (MAXALT) 10 MG tablet Take 1 tablet (10 mg total) by mouth as needed for migraine. May repeat after 2 hours if not better. No more than 2 pills in 24 hours   . TURMERIC PO Take by mouth daily.    . fluticasone (CUTIVATE) 0.005 % ointment Apply topically.    No facility-administered encounter medications on file as of 04/29/2020.   Patient Active Problem List   Diagnosis Date Noted  . Contact dermatitis 04/29/2020  . Vaginal irritation 11/13/2019  . Tinea corporis 09/16/2019  . At risk for sepsis 05/29/2019  . Impingement syndrome of right shoulder 05/04/2019  . Heart palpitations 04/17/2019  . History of PSVT (paroxysmal supraventricular tachycardia) 04/17/2019  . Degenerative tear of medial meniscus of right knee  04/08/2019  . Knee instability, right 04/07/2019  . Morbid obesity (HCC) 01/19/2019  . Sprain of calcaneofibular ligament of right ankle 12/12/2018  . Rotator cuff injury, initial encounter 12/10/2018  . S/P total knee arthroplasty, left 09/30/2018  . Dysuria 09/24/2018  . Abnormal uterine bleeding 05/16/2018  . Headache 05/16/2018  . Menorrhagia 05/16/2018  . Barrett's esophagus with dysplasia 04/07/2018  . Vitamin D deficiency 03/08/2018  . Vaginal low risk HPV DNA test positive 01/26/2018  . Left inguinal hernia 11/14/2017  . Posterior tibial tendon dysfunction (PTTD) of right lower extremity 11/14/2017  . Infection of skin due to methicillin  resistant Staphylococcus aureus (MRSA) 11/09/2017  . Abscess of abdominal wall 11/06/2017  . Vaginal yeast infection 09/21/2017  . Abscess of right foot 07/18/2017  . Mild episode of recurrent major depressive disorder (HCC) 06/27/2017  . Hidradenitis axillaris 05/28/2017  . History of Roux-en-Y gastric bypass 05/01/2017  . Migraines 05/01/2017  . Anxiety   . Arthritis   . Asthma   . Depression   . GERD (gastroesophageal reflux disease)   . Hypertension   . Osteoporosis   . Iron deficiency anemia 02/27/2017  . Paroxysmal supraventricular tachycardia (HCC) 02/27/2017  . Generalized abdominal pain 04/19/2016  . Seasonal allergic rhinitis 01/04/2016  . Primary osteoarthritis of left hip 01/29/2015  . Dietary counseling and surveillance 01/04/2015  . S/P gastric bypass 01/04/2015  . Chronic hip pain 09/24/2014  . Primary osteoarthritis of one knee, left 09/24/2014  . Pain in joint, ankle and foot 08/19/2013  . Dyspnea 06/30/2013  . Edema 06/30/2013  . Shoulder pain 06/30/2013  . Tongue lesion 06/19/2013  . Acquired pes planus of both feet 05/19/2013  . Calcific Achilles tendonitis 05/19/2013  . Neck muscle spasm 02/17/2013  . Carpal tunnel syndrome 06/14/2012   Past Medical History:  Diagnosis Date  . ADHD   . Allergy   .  Anemia   . Anxiety   . Arthritis   . Asthma   . Barrett's esophagus   . Bulging lumbar disc    L5-S1  . Depression   . Endometriosis   . GERD (gastroesophageal reflux disease)   . Hernia, hiatal    DDD  . HPV in female   . Hypertension   . Migraine   . Osteoporosis    Relevant past medical, surgical, family and social history reviewed and updated as indicated. Interim medical history since our last visit reviewed.  Review of Systems  Constitutional: Negative.  Negative for activity change, appetite change, chills, fatigue and fever.  Gastrointestinal: Negative.  Negative for diarrhea, nausea and vomiting.  Musculoskeletal: Negative.   Skin: Positive for rash. Negative for wound.  Neurological: Negative.   Psychiatric/Behavioral: Negative.     Per HPI unless specifically indicated above     Objective:    BP 117/74 (BP Location: Left Arm, Patient Position: Sitting, Cuff Size: Normal)   Pulse 66   Temp 98.5 F (36.9 C) (Oral)   Ht 5\' 2"  (1.575 m)   Wt 214 lb (97.1 kg)   SpO2 97%   BMI 39.14 kg/m   Wt Readings from Last 3 Encounters:  04/29/20 214 lb (97.1 kg)  02/23/20 209 lb 6 oz (95 kg)  01/27/20 212 lb (96.2 kg)    Physical Exam Vitals and nursing note reviewed.  Constitutional:      General: She is not in acute distress.    Appearance: Normal appearance. She is not toxic-appearing.  Abdominal:     General: Abdomen is flat. There is no distension.  Musculoskeletal:        General: Normal range of motion.     Right lower leg: No edema.     Left lower leg: No edema.  Skin:    General: Skin is warm and dry.     Coloration: Skin is not ashen.     Findings: Rash present. Rash is macular. Rash is not urticarial or vesicular.       Neurological:     General: No focal deficit present.     Mental Status: She is alert and oriented to person, place, and time.  Motor: No weakness.     Gait: Gait normal.  Psychiatric:        Mood and Affect: Mood normal.         Behavior: Behavior normal.        Thought Content: Thought content normal.        Judgment: Judgment normal.       Assessment & Plan:   Problem List Items Addressed This Visit      Musculoskeletal and Integument   Contact dermatitis - Primary    Acute, ongoing.  Appears to be a contact dermatitis, no s/s cellulitis or systemic infection on examination today.  With history of cellulitis in other lower extremity and history of knee replacement to same extremity, low threshold to prescribe antibiotics.  Patient educated to call or return to clinic if symptoms of cellulitis develop: warmth to extremity, fever, pain, nausea/vomiting.          Follow up plan: Return if symptoms worsen or fail to improve.

## 2020-04-29 NOTE — Assessment & Plan Note (Signed)
Acute, ongoing.  Appears to be a contact dermatitis, no s/s cellulitis or systemic infection on examination today.  With history of cellulitis in other lower extremity and history of knee replacement to same extremity, low threshold to prescribe antibiotics.  Patient educated to call or return to clinic if symptoms of cellulitis develop: warmth to extremity, fever, pain, nausea/vomiting.

## 2020-05-03 ENCOUNTER — Encounter: Payer: Self-pay | Admitting: Podiatry

## 2020-05-03 ENCOUNTER — Ambulatory Visit (INDEPENDENT_AMBULATORY_CARE_PROVIDER_SITE_OTHER): Payer: BC Managed Care – PPO | Admitting: Podiatry

## 2020-05-03 ENCOUNTER — Other Ambulatory Visit: Payer: Self-pay

## 2020-05-03 DIAGNOSIS — M79671 Pain in right foot: Secondary | ICD-10-CM | POA: Diagnosis not present

## 2020-05-03 DIAGNOSIS — M2141 Flat foot [pes planus] (acquired), right foot: Secondary | ICD-10-CM

## 2020-05-03 DIAGNOSIS — M722 Plantar fascial fibromatosis: Secondary | ICD-10-CM

## 2020-05-03 DIAGNOSIS — M2142 Flat foot [pes planus] (acquired), left foot: Secondary | ICD-10-CM

## 2020-05-04 ENCOUNTER — Encounter: Payer: Self-pay | Admitting: Podiatry

## 2020-05-04 NOTE — Progress Notes (Signed)
Subjective:  Patient ID: Nancy Sanchez, female    DOB: Nov 28, 1970,  MRN: [id number]  Chief Complaint  Patient presents with  . Rash    Patient presents today for rash on bilat feet x 2-3 weeks    49 y.o. female presents with the above complaint. Patient presents with a complaint of right heel pain that has been going on for quite some time. Patient states is starting to act back up again. Patient has received and being treated for plantar fasciitis to the right side. She states that usually injection helps with this. She states that she normally gets injection once a yearWhich tends to keep it away for a while. She would like to get another injection. She denies any other acute complaints. She has a very pes planovalgus foot deformity.   Review of Systems: Negative except as noted in the HPI. Denies N/V/F/Ch.  Past Medical History:  Diagnosis Date  . ADHD   . Allergy   . Anemia   . Anxiety   . Arthritis   . Asthma   . Barrett's esophagus   . Bulging lumbar disc    L5-S1  . Depression   . Endometriosis   . GERD (gastroesophageal reflux disease)   . Hernia, hiatal    DDD  . HPV in female   . Hypertension   . Migraine   . Osteoporosis     Current Outpatient Medications:  .  acetaminophen (TYLENOL) 650 MG CR tablet, Take by mouth., Disp: , Rfl:  .  albuterol (PROVENTIL) (2.5 MG/3ML) 0.083% nebulizer solution, Take 3 mLs (2.5 mg total) by nebulization every 6 (six) hours as needed for wheezing or shortness of breath., Disp: 150 mL, Rfl: 1 .  albuterol (VENTOLIN HFA) 108 (90 Base) MCG/ACT inhaler, 1-2 puffs by inhalation 4 times a day when needed, Disp: 18 g, Rfl: 3 .  BIOTIN PO, Take by mouth., Disp: , Rfl:  .  budesonide-formoterol (SYMBICORT) 160-4.5 MCG/ACT inhaler, Inhale 2 puffs into the lungs 2 (two) times daily., Disp: 10.2 g, Rfl: 3 .  buPROPion (WELLBUTRIN XL) 150 MG 24 hr tablet, Take 150 mg by mouth every morning., Disp: , Rfl:  .  buPROPion (WELLBUTRIN XL)  300 MG 24 hr tablet, Take 1 tablet (300 mg total) by mouth daily., Disp: 90 tablet, Rfl: 3 .  busPIRone (BUSPAR) 5 MG tablet, Take 1 tablet (5 mg total) by mouth as needed (Take twice a day as needed for increased anxiety.)., Disp: 60 tablet, Rfl: 1 .  Cholecalciferol (D3-50) 50000 units capsule, Take by mouth. Once a week, Disp: , Rfl:  .  cyclobenzaprine (FLEXERIL) 5 MG tablet, Take 1 tablet (5 mg total) by mouth 2 (two) times daily as needed for muscle spasms., Disp: 30 tablet, Rfl: 0 .  escitalopram (LEXAPRO) 20 MG tablet, Take 1 tablet (20 mg total) by mouth daily., Disp: 90 tablet, Rfl: 1 .  ferrous sulfate 324 (65 Fe) MG TBEC, Take by mouth daily., Disp: , Rfl:  .  fexofenadine (ALLEGRA) 180 MG tablet, Take 1 tablet (180 mg total) by mouth daily., Disp: 90 tablet, Rfl: 2 .  fluticasone (CUTIVATE) 0.005 % ointment, Apply topically., Disp: , Rfl:  .  fluticasone (FLONASE) 50 MCG/ACT nasal spray, Place into the nose as needed. , Disp: , Rfl:  .  gabapentin (NEURONTIN) 300 MG capsule, Take 2 capsules (600 mg total) by mouth 2 (two) times daily., Disp: 360 capsule, Rfl: 1 .  ketorolac (TORADOL) 10 MG tablet, Take 10 mg  by mouth every 6 (six) hours as needed., Disp: , Rfl:  .  magnesium oxide (MAG-OX) 400 MG tablet, Take 400 mg by mouth 2 (two) times daily., Disp: , Rfl:  .  meclizine (ANTIVERT) 25 MG tablet, Take by mouth as needed. , Disp: , Rfl:  .  montelukast (SINGULAIR) 10 MG tablet, Take 1 tablet (10 mg total) by mouth daily., Disp: 90 tablet, Rfl: 1 .  mupirocin ointment (BACTROBAN) 2 %, Place 1 application into the nose 2 (two) times daily., Disp: 22 g, Rfl: 0 .  nystatin (MYCOSTATIN/NYSTOP) powder, Apply topically 3 (three) times daily., Disp: 90 g, Rfl: 5 .  nystatin cream (MYCOSTATIN), Apply 1 application topically 2 (two) times daily., Disp: 90 g, Rfl: 5 .  Omega-3 1000 MG CAPS, Take by mouth., Disp: , Rfl:  .  omeprazole (PRILOSEC) 40 MG capsule, Take 1 capsule (40 mg total) by mouth  2 (two) times daily before a meal., Disp: 180 capsule, Rfl: 1 .  ondansetron (ZOFRAN ODT) 4 MG disintegrating tablet, Take 1 tablet (4 mg total) by mouth every 8 (eight) hours as needed for nausea or vomiting., Disp: 20 tablet, Rfl: 0 .  propranolol (INDERAL) 20 MG tablet, Take 2 tablets (40 mg total) by mouth 2 (two) times daily., Disp: 120 tablet, Rfl: 0 .  QUEtiapine (SEROQUEL) 25 MG tablet, Take 25 mg by mouth at bedtime., Disp: , Rfl:  .  QUEtiapine (SEROQUEL) 50 MG tablet, Take 1 tablet (50 mg total) by mouth at bedtime., Disp: 30 tablet, Rfl: 0 .  rizatriptan (MAXALT) 10 MG tablet, Take 1 tablet (10 mg total) by mouth as needed for migraine. May repeat after 2 hours if not better. No more than 2 pills in 24 hours, Disp: 10 tablet, Rfl: 3 .  TURMERIC PO, Take by mouth daily. , Disp: , Rfl:   Social History   Tobacco Use  Smoking Status Former Smoker  . Quit date: 10/23/1998  . Years since quitting: 21.5  Smokeless Tobacco Never Used    Allergies  Allergen Reactions  . Metronidazole Hives, Itching and Rash  . Oxycodone-Acetaminophen Nausea And Vomiting    Other reaction(s): Nausea And Vomiting nausea nausea  . Prednisone Anxiety    Other reaction(s): Other (See Comments) "agitated"  . Tape Rash  . Aspirin     Unable to take aspirin products since [facility] Gastric Bypass  . Codeine     Other reaction(s): Other (See Comments)  . Nickel Itching    Itching from earrings -unsure of composition  . Nsaids Other (See Comments)    Gastric bypass  . Other Itching    Itching from earrings -unsure of composition  . Oxycodone Other (See Comments)    Other reaction(s): Vomiting  . Fluticasone-Salmeterol Anxiety    Other Reaction: jittery Other Reaction: jittery Other Reaction: jittery Other Reaction: jittery Other Reaction: jittery  . Latex Rash    Other Reaction: Rash on hands only Other Reaction: Rash on hands only  . Morphine Anxiety and Itching    Does well with dilaudid and  toradol Does well with dilaudid and toradol   Objective:  There were no vitals filed for this visit. There is no height or weight on file to calculate BMI. Constitutional Well developed. Well nourished.  Vascular Dorsalis pedis pulses palpable bilaterally. Posterior tibial pulses palpable bilaterally. Capillary refill normal to all digits.  No cyanosis or clubbing noted. Pedal hair growth normal.  Neurologic Normal speech. Oriented to person, place, and time. Epicritic sensation  to light touch grossly present bilaterally.  Dermatologic Nails well groomed and normal in appearance. No open wounds. No skin lesions.  Orthopedic: Normal joint ROM without pain or crepitus bilaterally. No visible deformities. Tender to palpation at the calcaneal tuber right. No pain with calcaneal squeeze right. Ankle ROM diminished range of motion right. Silfverskiold Test: positive right.   Radiographs: None  Assessment:   1. Plantar fasciitis of right foot   2. Pain in right foot   3. Pes planus of both feet    Plan:  Patient was evaluated and treated and all questions answered.  Plantar Fasciitis, right - XR reviewed as above.  - Educated on icing and stretching. Instructions given.  - Injection delivered to the plantar fascia as below. - DME: I will hold off on any brace at this time. - Pharmacologic management: Meloxicam/Medrol Dose Pak. Educated on risks/benefits and proper taking of medication.  Pes planovalgus both feet -I explained patient the etiology of pes planovalgus and various treatment options were extensively discussed. I believe that patient will benefit from custom-made orthotics to help address her pes planovalgus. I will discuss this with her in the near future.  Procedure: Injection Tendon/Ligament Location: Right plantar fascia at the glabrous junction; medial approach. Skin Prep: alcohol Injectate: 0.5 cc 0.5% marcaine plain, 0.5 cc of 1% Lidocaine, 0.5 cc kenalog  10. Disposition: Patient tolerated procedure well. Injection site dressed with a band-aid.  No follow-ups on file.

## 2020-06-09 ENCOUNTER — Encounter: Payer: Self-pay | Admitting: Family Medicine

## 2020-06-14 ENCOUNTER — Encounter: Payer: Self-pay | Admitting: Family Medicine

## 2020-06-14 ENCOUNTER — Ambulatory Visit (INDEPENDENT_AMBULATORY_CARE_PROVIDER_SITE_OTHER): Payer: BC Managed Care – PPO | Admitting: Family Medicine

## 2020-06-14 ENCOUNTER — Other Ambulatory Visit: Payer: Self-pay

## 2020-06-14 VITALS — BP 125/80 | HR 78 | Temp 98.4°F | Ht 63.0 in | Wt 219.2 lb

## 2020-06-14 DIAGNOSIS — Z124 Encounter for screening for malignant neoplasm of cervix: Secondary | ICD-10-CM | POA: Diagnosis not present

## 2020-06-14 DIAGNOSIS — E559 Vitamin D deficiency, unspecified: Secondary | ICD-10-CM | POA: Diagnosis not present

## 2020-06-14 DIAGNOSIS — Z1231 Encounter for screening mammogram for malignant neoplasm of breast: Secondary | ICD-10-CM | POA: Diagnosis not present

## 2020-06-14 DIAGNOSIS — Z0000 Encounter for general adult medical examination without abnormal findings: Secondary | ICD-10-CM | POA: Diagnosis not present

## 2020-06-14 LAB — URINALYSIS, ROUTINE W REFLEX MICROSCOPIC
Bilirubin, UA: NEGATIVE
Glucose, UA: NEGATIVE
Ketones, UA: NEGATIVE
Leukocytes,UA: NEGATIVE
Nitrite, UA: NEGATIVE
Protein,UA: NEGATIVE
RBC, UA: NEGATIVE
Specific Gravity, UA: <1.005 — ABNORMAL LOW (ref 1.005–1.030)
Urobilinogen, Ur: 0.2 mg/dL (ref 0.2–1.0)
pH, UA: 6 (ref 5.0–7.5)

## 2020-06-14 LAB — BAYER DCA HB A1C WAIVED: HB A1C (BAYER DCA - WAIVED): 4.8 % (ref <7.0)

## 2020-06-14 MED ORDER — ONDANSETRON 4 MG PO TBDP: 4.0000 mg | ORAL_TABLET | Freq: Three times a day (TID) | ORAL | 0 refills | Status: DC | PRN

## 2020-06-14 MED ORDER — CYCLOBENZAPRINE HCL 5 MG PO TABS: 5.0000 mg | ORAL_TABLET | Freq: Two times a day (BID) | ORAL | 0 refills | Status: DC | PRN

## 2020-06-14 NOTE — Progress Notes (Signed)
BP 125/80 (BP Location: Left Arm, Patient Position: Sitting, Cuff Size: Normal)   Pulse 78   Temp 98.4 F (36.9 C) (Oral)   Ht _0  (1.6 m)   Wt 219 lb 3.2 oz (99.4 kg)   LMP 06/01/2020 (Exact Date)   SpO2 99%   BMI 38.83 kg/m    Subjective:    Patient ID: Nancy Sanchez, female    DOB: [birth year], 49 y.o.   MRN: [id number]  HPI: Nancy Sanchez is a 49 y.o. female presenting on 06/14/2020 for comprehensive medical examination. Current medical complaints include:  Has been seeing a psychiatrist. Feeling a little bit more stable.   Menopausal Symptoms: no  Depression Screen done today and results listed below:  Depression screen [facility] 2/9 06/14/2020 12/19/2019 10/08/2019 06/13/2019 01/19/2019  Decreased Interest _1 Down, Depressed, Hopeless 0 2 0 0 3  PHQ - 2 Score _2 Altered sleeping _3 Tired, decreased energy _4 Change in appetite _5 0 0  Feeling bad or failure about yourself  0 1 0 0 0  Trouble concentrating _6 Moving slowly or fidgety/restless 0 1 0 0 0  Suicidal thoughts 0 0 0 0 1  PHQ-9 Score _7 Difficult doing work/chores - - Not difficult at all Not difficult at all Very difficult  Some recent data might be hidden     Past Medical History:  Past Medical History:  Diagnosis Date  . ADHD   . Allergy   . Anemia   . Anxiety   . Arthritis   . Asthma   . Barrett's esophagus   . Bulging lumbar disc    L5-S1  . Depression   . Endometriosis   . GERD (gastroesophageal reflux disease)   . Hernia, hiatal    DDD  . HPV in female   . Hypertension   . Migraine   . Osteoporosis     Surgical History:  Past Surgical History:  Procedure Laterality Date  . ARTHROSCOPIC REPAIR ACL Left   . CARPAL TUNNEL RELEASE Bilateral   . ESSURE TUBAL LIGATION    . GASTRIC BYPASS    . HEEL SPUR EXCISION Left   . INGUINAL HERNIA REPAIR  12/18/2017  . JOINT REPLACEMENT Left    knee  . SHOULDER ARTHROSCOPY W/  LABRAL REPAIR  03/2020  . TONSILLECTOMY      Medications:  Current Outpatient Medications on File Prior to Visit  Medication Sig  . acetaminophen (TYLENOL) 650 MG CR tablet Take by mouth.  Marland Kitchen albuterol (PROVENTIL) (2.5 MG/3ML) 0.083% nebulizer solution Take 3 mLs (2.5 mg total) by nebulization every 6 (six) hours as needed for wheezing or shortness of breath.  Marland Kitchen albuterol (VENTOLIN HFA) 108 (90 Base) MCG/ACT inhaler 1-2 puffs by inhalation 4 times a day when needed  . ascorbic acid (VITAMIN C) 500 MG tablet Take by mouth.  . Biotin 1 MG CAPS Take by mouth.  Marland Kitchen buPROPion (WELLBUTRIN XL) 150 MG 24 hr tablet Take 150 mg by mouth every morning.  Marland Kitchen buPROPion (WELLBUTRIN XL) 300 MG 24 hr tablet Take 1 tablet (300 mg total) by mouth daily.  . busPIRone (BUSPAR) 5 MG tablet Take 1 tablet (5 mg total) by mouth as needed (Take twice a day as needed for increased anxiety.).  Marland Kitchen Cholecalciferol (D3-50) 50000 units capsule Take by mouth.  Once a week  . escitalopram (LEXAPRO) 20 MG tablet Take 1 tablet (20 mg total) by mouth daily.  . ferrous sulfate 324 (65 Fe) MG TBEC Take by mouth daily.  . fexofenadine (ALLEGRA) 180 MG tablet Take 1 tablet (180 mg total) by mouth daily.  . fluticasone (CUTIVATE) 0.005 % ointment Apply topically.  . fluticasone (FLONASE) 50 MCG/ACT nasal spray Place into the nose as needed.   . gabapentin (NEURONTIN) 300 MG capsule Take 2 capsules (600 mg total) by mouth 2 (two) times daily.  . magnesium oxide (MAG-OX) 400 MG tablet Take 400 mg by mouth 2 (two) times daily.  . meclizine (ANTIVERT) 25 MG tablet Take by mouth as needed.   . Misc Natural Products (IN-FLA-MEND) CAPS Take by mouth.  . nystatin (MYCOSTATIN/NYSTOP) powder Apply topically 3 (three) times daily.  Marland Kitchen nystatin cream (MYCOSTATIN) Apply 1 application topically 2 (two) times daily.  . Omega-3 1000 MG CAPS Take by mouth.  Marland Kitchen omeprazole (PRILOSEC) 40 MG capsule Take 1 capsule (40 mg total) by mouth 2 (two) times daily  before a meal.  . propranolol (INDERAL) 20 MG tablet Take 2 tablets (40 mg total) by mouth 2 (two) times daily.  . QUEtiapine (SEROQUEL) 25 MG tablet Take 25 mg by mouth at bedtime.  Marland Kitchen QUEtiapine (SEROQUEL) 50 MG tablet Take 1 tablet (50 mg total) by mouth at bedtime.  . rizatriptan (MAXALT) 10 MG tablet Take 1 tablet (10 mg total) by mouth as needed for migraine. May repeat after 2 hours if not better. No more than 2 pills in 24 hours  . TURMERIC PO Take by mouth daily.   . budesonide-formoterol (SYMBICORT) 160-4.5 MCG/ACT inhaler Inhale 2 puffs into the lungs 2 (two) times daily. (Patient not taking: Reported on 06/14/2020)  . calcium carbonate (OSCAL) 1500 (600 Ca) MG TABS tablet Take by mouth. (Patient not taking: Reported on 06/14/2020)  . ketorolac (TORADOL) 10 MG tablet Take 10 mg by mouth every 6 (six) hours as needed. (Patient not taking: Reported on 06/14/2020)  . montelukast (SINGULAIR) 10 MG tablet Take 1 tablet (10 mg total) by mouth daily. (Patient not taking: Reported on 06/14/2020)  . mupirocin ointment (BACTROBAN) 2 % Place 1 application into the nose 2 (two) times daily. (Patient not taking: Reported on 06/14/2020)   No current facility-administered medications on file prior to visit.    Allergies:  Allergies  Allergen Reactions  . Metronidazole Hives, Itching and Rash  . Oxycodone-Acetaminophen Nausea And Vomiting    Other reaction(s): Nausea And Vomiting nausea nausea  . Prednisone Anxiety    Other reaction(s): Other (See Comments) "agitated"  . Tape Rash  . Aspirin     Unable to take aspirin products since [facility] Gastric Bypass  . Codeine     Other reaction(s): Other (See Comments)  . Nickel Itching    Itching from earrings -unsure of composition  . Nsaids Other (See Comments)    Gastric bypass  . Other Itching    Itching from earrings -unsure of composition  . Oxycodone Other (See Comments)    Other reaction(s): Vomiting  . Fluticasone-Salmeterol Anxiety    Other  Reaction: jittery Other Reaction: jittery Other Reaction: jittery Other Reaction: jittery Other Reaction: jittery  . Latex Rash    Other Reaction: Rash on hands only Other Reaction: Rash on hands only  . Morphine Anxiety and Itching    Does well with dilaudid and toradol Does well with dilaudid and toradol    Social History:  Social  History   Socioeconomic History  . Marital status: Single    Spouse name: Not on file  . Number of children: Not on file  . Years of education: Not on file  . Highest education level: Not on file  Occupational History  . Not on file  Tobacco Use  . Smoking status: Former Smoker    Quit date: 10/23/1998    Years since quitting: 21.6  . Smokeless tobacco: Never Used  Vaping Use  . Vaping Use: Never used  Substance and Sexual Activity  . Alcohol use: No  . Drug use: No  . Sexual activity: Yes    Birth control/protection: Surgical  Other Topics Concern  . Not on file  Social History Narrative  . Not on file   Social Determinants of Health   Financial Resource Strain:   . Difficulty of Paying Living Expenses: Not on file  Food Insecurity:   . Worried About Charity fundraiser in the Last Year: Not on file  . Ran Out of Food in the Last Year: Not on file  Transportation Needs:   . Lack of Transportation (Medical): Not on file  . Lack of Transportation (Non-Medical): Not on file  Physical Activity:   . Days of Exercise per Week: Not on file  . Minutes of Exercise per Session: Not on file  Stress:   . Feeling of Stress : Not on file  Social Connections:   . Frequency of Communication with Friends and Family: Not on file  . Frequency of Social Gatherings with Friends and Family: Not on file  . Attends Religious Services: Not on file  . Active Member of Clubs or Organizations: Not on file  . Attends Archivist Meetings: Not on file  . Marital Status: Not on file  Intimate Partner Violence:   . Fear of Current or Ex-Partner:  Not on file  . Emotionally Abused: Not on file  . Physically Abused: Not on file  . Sexually Abused: Not on file   Social History   Tobacco Use  Smoking Status Former Smoker  . Quit date: 10/23/1998  . Years since quitting: 21.6  Smokeless Tobacco Never Used   Social History   Substance and Sexual Activity  Alcohol Use No    Family History:  Family History  Adopted: Yes  Problem Relation Age of Onset  . Heart disease Father   . Breast cancer Neg Hx     Past medical history, surgical history, medications, allergies, family history and social history reviewed with patient today and changes made to appropriate areas of the chart.   Review of Systems  Constitutional: Positive for diaphoresis. Negative for chills, fever, malaise/fatigue and weight loss.  HENT: Positive for hearing loss. Negative for congestion, ear discharge, ear pain, nosebleeds, sinus pain, sore throat and tinnitus.   Eyes: Negative.   Respiratory: Negative.  Negative for stridor.   Cardiovascular: Negative.   Gastrointestinal: Positive for heartburn, nausea and vomiting. Negative for abdominal pain, blood in stool, constipation, diarrhea and melena.  Genitourinary: Negative.   Musculoskeletal: Positive for back pain, joint pain and myalgias. Negative for falls and neck pain.  Skin: Negative.   Neurological: Negative.   Endo/Heme/Allergies: Positive for environmental allergies. Negative for polydipsia. Does not bruise/bleed easily.  Psychiatric/Behavioral: Positive for depression. Negative for hallucinations, memory loss, substance abuse and suicidal ideas. The patient is nervous/anxious and has insomnia.     All other ROS negative except what is listed above and in the HPI.  Objective:    BP 125/80 (BP Location: Left Arm, Patient Position: Sitting, Cuff Size: Normal)   Pulse 78   Temp 98.4 F (36.9 C) (Oral)   Ht _0  (1.6 m)   Wt 219 lb 3.2 oz (99.4 kg)   LMP 06/01/2020 (Exact Date)   SpO2  99%   BMI 38.83 kg/m   Wt Readings from Last 3 Encounters:  06/14/20 219 lb 3.2 oz (99.4 kg)  04/29/20 214 lb (97.1 kg)  02/23/20 209 lb 6 oz (95 kg)    Physical Exam Vitals and nursing note reviewed. Exam conducted with a chaperone present.  Constitutional:      General: She is not in acute distress.    Appearance: Normal appearance. She is not ill-appearing, toxic-appearing or diaphoretic.  HENT:     Head: Normocephalic and atraumatic.     Right Ear: Tympanic membrane, ear canal and external ear normal. There is no impacted cerumen.     Left Ear: Tympanic membrane, ear canal and external ear normal. There is no impacted cerumen.     Nose: Nose normal. No congestion or rhinorrhea.     Mouth/Throat:     Mouth: Mucous membranes are moist.     Pharynx: Oropharynx is clear. No oropharyngeal exudate or posterior oropharyngeal erythema.  Eyes:     General: No scleral icterus.       Right eye: No discharge.        Left eye: No discharge.     Extraocular Movements: Extraocular movements intact.     Conjunctiva/sclera: Conjunctivae normal.     Pupils: Pupils are equal, round, and reactive to light.  Neck:     Vascular: No carotid bruit.  Cardiovascular:     Rate and Rhythm: Normal rate and regular rhythm.     Pulses: Normal pulses.     Heart sounds: No murmur heard.  No friction rub. No gallop.   Pulmonary:     Effort: Pulmonary effort is normal. No respiratory distress.     Breath sounds: Normal breath sounds. No stridor. No wheezing, rhonchi or rales.  Chest:     Chest wall: No tenderness.     Breasts:        Right: Normal. No swelling, bleeding, inverted nipple, mass, nipple discharge, skin change or tenderness.        Left: Normal. No swelling, bleeding, inverted nipple, mass, nipple discharge, skin change or tenderness.  Abdominal:     General: Abdomen is flat. Bowel sounds are normal. There is no distension.     Palpations: Abdomen is soft. There is no mass.      Tenderness: There is no abdominal tenderness. There is no right CVA tenderness, left CVA tenderness, guarding or rebound.     Hernia: No hernia is present.  Genitourinary:    Labia:        Right: No rash, tenderness, lesion or injury.        Left: No rash, tenderness, lesion or injury.      Urethra: No prolapse, urethral pain, urethral swelling or urethral lesion.     Vagina: Normal.     Cervix: Normal.     Uterus: Normal.      Adnexa: Right adnexa normal.  Musculoskeletal:        General: No swelling, tenderness, deformity or signs of injury.     Cervical back: Normal range of motion and neck supple. No rigidity. No muscular tenderness.     Right lower leg: No edema.  Left lower leg: No edema.  Lymphadenopathy:     Cervical: No cervical adenopathy.     Upper Body:     Right upper body: No supraclavicular, axillary or pectoral adenopathy.     Left upper body: No supraclavicular, axillary or pectoral adenopathy.  Skin:    General: Skin is warm and dry.     Capillary Refill: Capillary refill takes less than 2 seconds.     Coloration: Skin is not jaundiced or pale.     Findings: No bruising, erythema, lesion or rash.  Neurological:     General: No focal deficit present.     Mental Status: She is alert and oriented to person, place, and time. Mental status is at baseline.     Cranial Nerves: No cranial nerve deficit.     Sensory: No sensory deficit.     Motor: No weakness.     Coordination: Coordination normal.     Gait: Gait normal.     Deep Tendon Reflexes: Reflexes normal.  Psychiatric:        Mood and Affect: Mood normal.        Behavior: Behavior normal.        Thought Content: Thought content normal.        Judgment: Judgment normal.     Results for orders placed or performed in visit on 03/09/20  Sed Rate (ESR)  Result Value Ref Range   Sed Rate 2 0 - 32 mm/hr  Rheumatoid Factor  Result Value Ref Range   Rhuematoid fact SerPl-aCnc <10.0 0.0 - 13.9 IU/mL  ANA  w/Reflex  Result Value Ref Range   Anti Nuclear Antibody (ANA) Negative Negative  Ferritin  Result Value Ref Range   Ferritin 17 15.0 - 150.0 ng/mL  CBC with Differential/Platelet  Result Value Ref Range   WBC 7.0 3.4 - 10.8 x10E3/uL   RBC 4.38 3.77 - 5.28 x10E6/uL   Hemoglobin 12.9 11.1 - 15.9 g/dL   Hematocrit 40.7 34.0 - 46.6 %   MCV 93 79 - 97 fL   MCH 29.5 26.6 - 33.0 pg   MCHC 31.7 31 - 35 g/dL   RDW 11.8 11.7 - 15.4 %   Platelets 288 150 - 450 x10E3/uL   Neutrophils 62 Not Estab. %   Lymphs 26 Not Estab. %   Monocytes 8 Not Estab. %   Eos 3 Not Estab. %   Basos 1 Not Estab. %   Neutrophils Absolute 4.3 1 - 7 x10E3/uL   Lymphocytes Absolute 1.8 0 - 3 x10E3/uL   Monocytes Absolute 0.6 0 - 0 x10E3/uL   EOS (ABSOLUTE) 0.2 0.0 - 0.4 x10E3/uL   Basophils Absolute 0.1 0 - 0 x10E3/uL   Immature Granulocytes 0 Not Estab. %   Immature Grans (Abs) 0.0 0.0 - 0.1 x10E3/uL  Uric acid  Result Value Ref Range   Uric Acid 7.0 (H) 2.6 - 6.2 mg/dL      Assessment & Plan:   Problem List Items Addressed This Visit      Other   Vitamin D deficiency    Rechecking labs today. Await results.       Relevant Orders   VITAMIN D 25 Hydroxy (Vit-D Deficiency, Fractures)    Other Visit Diagnoses    Routine general medical examination at a health care facility    -  Primary   Vaccines up to date/declined. Screening labs checked today. Pap done. Mammogram ordered. Colonoscopy up to date. Continue diet and exercise. Call with concerns.  Relevant Orders   Bayer DCA Hb A1c Waived   CBC with Differential/Platelet   Comprehensive metabolic panel   Lipid Panel w/o Chol/HDL Ratio   TSH   Urinalysis, Routine w reflex microscopic   HIV Antibody (routine testing w rflx)   GC/Chlamydia Probe Amp   Hepatitis, Acute   RPR   HSV(herpes simplex vrs) 1+2 ab-IgG   SARS-CoV-2 Semi-Quantitative Total Antibody, Spike   Screening for cervical cancer       Pap done today.   Relevant Orders   Pap IG  (Image Guided)   HPV Aptima   Encounter for screening mammogram for malignant neoplasm of breast       Mammogram ordered today.   Relevant Orders   MM 3D SCREEN BREAST BILATERAL       Follow up plan: Return ASAP Follow up.   LABORATORY TESTING:  - Pap smear: pap done  IMMUNIZATIONS:   - Tdap: Tetanus vaccination status reviewed: last tetanus booster within 10 years. - Influenza: Postponed to flu season - Pneumovax: Up to date - Prevnar: Not applicable - COVID: Refused  SCREENING: -Mammogram: Ordered today  - Colonoscopy: Up to date- done in 2017  - Bone Density: Not applicable   PATIENT COUNSELING:   Advised to take 1 mg of folate supplement per day if capable of pregnancy.   Sexuality: Discussed sexually transmitted diseases, partner selection, use of condoms, avoidance of unintended pregnancy  and contraceptive alternatives.   Advised to avoid cigarette smoking.  I discussed with the patient that most people either abstain from alcohol or drink within safe limits (<=14/week and <=4 drinks/occasion for males, <=7/weeks and <= 3 drinks/occasion for females) and that the risk for alcohol disorders and other health effects rises proportionally with the number of drinks per week and how often a drinker exceeds daily limits.  Discussed cessation/primary prevention of drug use and availability of treatment for abuse.   Diet: Encouraged to adjust caloric intake to maintain  or achieve ideal body weight, to reduce intake of dietary saturated fat and total fat, to limit sodium intake by avoiding high sodium foods and not adding table salt, and to maintain adequate dietary potassium and calcium preferably from fresh fruits, vegetables, and low-fat dairy products.    stressed the importance of regular exercise  Injury prevention: Discussed safety belts, safety helmets, smoke detector, smoking near bedding or upholstery.   Dental health: Discussed importance of regular tooth  brushing, flossing, and dental visits.    NEXT PREVENTATIVE PHYSICAL DUE IN 1 YEAR. Return ASAP Follow up.

## 2020-06-14 NOTE — Assessment & Plan Note (Signed)
Rechecking labs today. Await results.  

## 2020-06-14 NOTE — Patient Instructions (Signed)
Health Maintenance, Female Adopting a healthy lifestyle and getting preventive care are important in promoting health and wellness. Ask your health care provider about:  The right schedule for you to have regular tests and exams.  Things you can do on your own to prevent diseases and keep yourself healthy. What should I know about diet, weight, and exercise? Eat a healthy diet   Eat a diet that includes plenty of vegetables, fruits, low-fat dairy products, and lean protein.  Do not eat a lot of foods that are high in solid fats, added sugars, or sodium. Maintain a healthy weight Body mass index (BMI) is used to identify weight problems. It estimates body fat based on height and weight. Your health care provider can help determine your BMI and help you achieve or maintain a healthy weight. Get regular exercise Get regular exercise. This is one of the most important things you can do for your health. Most adults should:  Exercise for at least 150 minutes each week. The exercise should increase your heart rate and make you sweat (moderate-intensity exercise).  Do strengthening exercises at least twice a week. This is in addition to the moderate-intensity exercise.  Spend less time sitting. Even light physical activity can be beneficial. Watch cholesterol and blood lipids Have your blood tested for lipids and cholesterol at 49 years of age, then have this test every 5 years. Have your cholesterol levels checked more often if:  Your lipid or cholesterol levels are high.  You are older than 49 years of age.  You are at high risk for heart disease. What should I know about cancer screening? Depending on your health history and family history, you may need to have cancer screening at various ages. This may include screening for:  Breast cancer.  Cervical cancer.  Colorectal cancer.  Skin cancer.  Lung cancer. What should I know about heart disease, diabetes, and high blood  pressure? Blood pressure and heart disease  High blood pressure causes heart disease and increases the risk of stroke. This is more likely to develop in people who have high blood pressure readings, are of African descent, or are overweight.  Have your blood pressure checked: ? Every 3-5 years if you are 18-39 years of age. ? Every year if you are 40 years old or older. Diabetes Have regular diabetes screenings. This checks your fasting blood sugar level. Have the screening done:  Once every three years after age 40 if you are at a normal weight and have a low risk for diabetes.  More often and at a younger age if you are overweight or have a high risk for diabetes. What should I know about preventing infection? Hepatitis B If you have a higher risk for hepatitis B, you should be screened for this virus. Talk with your health care provider to find out if you are at risk for hepatitis B infection. Hepatitis C Testing is recommended for:  Everyone born from 1945 through 1965.  Anyone with known risk factors for hepatitis C. Sexually transmitted infections (STIs)  Get screened for STIs, including gonorrhea and chlamydia, if: ? You are sexually active and are younger than 49 years of age. ? You are older than 49 years of age and your health care provider tells you that you are at risk for this type of infection. ? Your sexual activity has changed since you were last screened, and you are at increased risk for chlamydia or gonorrhea. Ask your health care provider if   you are at risk.  Ask your health care provider about whether you are at high risk for HIV. Your health care provider may recommend a prescription medicine to help prevent HIV infection. If you choose to take medicine to prevent HIV, you should first get tested for HIV. You should then be tested every 3 months for as long as you are taking the medicine. Pregnancy  If you are about to stop having your period (premenopausal) and  you may become pregnant, seek counseling before you get pregnant.  Take 400 to 800 micrograms (mcg) of folic acid every day if you become pregnant.  Ask for birth control (contraception) if you want to prevent pregnancy. Osteoporosis and menopause Osteoporosis is a disease in which the bones lose minerals and strength with aging. This can result in bone fractures. If you are 65 years old or older, or if you are at risk for osteoporosis and fractures, ask your health care provider if you should:  Be screened for bone loss.  Take a calcium or vitamin D supplement to lower your risk of fractures.  Be given hormone replacement therapy (HRT) to treat symptoms of menopause. Follow these instructions at home: Lifestyle  Do not use any products that contain nicotine or tobacco, such as cigarettes, e-cigarettes, and chewing tobacco. If you need help quitting, ask your health care provider.  Do not use street drugs.  Do not share needles.  Ask your health care provider for help if you need support or information about quitting drugs. Alcohol use  Do not drink alcohol if: ? Your health care provider tells you not to drink. ? You are pregnant, may be pregnant, or are planning to become pregnant.  If you drink alcohol: ? Limit how much you use to 0-1 drink a day. ? Limit intake if you are breastfeeding.  Be aware of how much alcohol is in your drink. In the [place] one drink equals one 12 oz bottle of beer (355 mL), one 5 oz glass of wine (148 mL), or one 1 oz glass of hard liquor (44 mL). General instructions  Schedule regular health, dental, and eye exams.  Stay current with your vaccines.  Tell your health care provider if: ? You often feel depressed. ? You have ever been abused or do not feel safe at home. Summary  Adopting a healthy lifestyle and getting preventive care are important in promoting health and wellness.  Follow your health care provider's instructions about healthy  diet, exercising, and getting tested or screened for diseases.  Follow your health care provider's instructions on monitoring your cholesterol and blood pressure. This information is not intended to replace advice given to you by your health care provider. Make sure you discuss any questions you have with your health care provider. Document Revised: 10/02/2018 Document Reviewed: 10/02/2018 Elsevier Patient Education  2020 [employer]  

## 2020-06-15 ENCOUNTER — Encounter: Payer: BC Managed Care – PPO | Admitting: Family Medicine

## 2020-06-15 DIAGNOSIS — F331 Major depressive disorder, recurrent, moderate: Secondary | ICD-10-CM | POA: Diagnosis not present

## 2020-06-15 DIAGNOSIS — F419 Anxiety disorder, unspecified: Secondary | ICD-10-CM | POA: Diagnosis not present

## 2020-06-16 LAB — COMPREHENSIVE METABOLIC PANEL
ALT: 14 IU/L (ref 0–32)
AST: 17 IU/L (ref 0–40)
Albumin/Globulin Ratio: 1.6 (ref 1.2–2.2)
Albumin: 4.4 g/dL (ref 3.8–4.8)
Alkaline Phosphatase: 75 IU/L (ref 48–121)
BUN/Creatinine Ratio: 25 — ABNORMAL HIGH (ref 9–23)
BUN: 17 mg/dL (ref 6–24)
Bilirubin Total: 0.4 mg/dL (ref 0.0–1.2)
CO2: 26 mmol/L (ref 20–29)
Calcium: 9.2 mg/dL (ref 8.7–10.2)
Chloride: 101 mmol/L (ref 96–106)
Creatinine, Ser: 0.68 mg/dL (ref 0.57–1.00)
GFR calc Af Amer: 119 mL/min/{1.73_m2} (ref >59)
GFR calc non Af Amer: 103 mL/min/{1.73_m2} (ref >59)
Globulin, Total: 2.7 g/dL (ref 1.5–4.5)
Glucose: 79 mg/dL (ref 65–99)
Potassium: 4.6 mmol/L (ref 3.5–5.2)
Sodium: 140 mmol/L (ref 134–144)
Total Protein: 7.1 g/dL (ref 6.0–8.5)

## 2020-06-16 LAB — HSV(HERPES SIMPLEX VRS) I + II AB-IGG
HSV 1 Glycoprotein G Ab, IgG: 1.07 index — ABNORMAL HIGH (ref 0.00–0.90)
HSV 2 IgG, Type Spec: 10.7 index — ABNORMAL HIGH (ref 0.00–0.90)

## 2020-06-16 LAB — CBC WITH DIFFERENTIAL/PLATELET
Basophils Absolute: 0 10*3/uL (ref 0.0–0.2)
Basos: 1 %
EOS (ABSOLUTE): 0.1 10*3/uL (ref 0.0–0.4)
Eos: 2 %
Hematocrit: 39.7 % (ref 34.0–46.6)
Hemoglobin: 12.2 g/dL (ref 11.1–15.9)
Immature Grans (Abs): 0 10*3/uL (ref 0.0–0.1)
Immature Granulocytes: 0 %
Lymphocytes Absolute: 1.7 10*3/uL (ref 0.7–3.1)
Lymphs: 28 %
MCH: 27.1 pg (ref 26.6–33.0)
MCHC: 30.7 g/dL — ABNORMAL LOW (ref 31.5–35.7)
MCV: 88 fL (ref 79–97)
Monocytes Absolute: 0.5 10*3/uL (ref 0.1–0.9)
Monocytes: 8 %
Neutrophils Absolute: 3.6 10*3/uL (ref 1.4–7.0)
Neutrophils: 61 %
Platelets: 336 10*3/uL (ref 150–450)
RBC: 4.5 x10E6/uL (ref 3.77–5.28)
RDW: 12.2 % (ref 11.7–15.4)
WBC: 5.9 10*3/uL (ref 3.4–10.8)

## 2020-06-16 LAB — GC/CHLAMYDIA PROBE AMP
Chlamydia trachomatis, NAA: NEGATIVE
Neisseria Gonorrhoeae by PCR: NEGATIVE

## 2020-06-16 LAB — LIPID PANEL W/O CHOL/HDL RATIO
Cholesterol, Total: 203 mg/dL — ABNORMAL HIGH (ref 100–199)
HDL: 72 mg/dL (ref >39)
LDL Chol Calc (NIH): 106 mg/dL — ABNORMAL HIGH (ref 0–99)
Triglycerides: 147 mg/dL (ref 0–149)
VLDL Cholesterol Cal: 25 mg/dL (ref 5–40)

## 2020-06-16 LAB — SARS-COV-2 SEMI-QUANTITATIVE TOTAL ANTIBODY, SPIKE
SARS-CoV-2 Semi-Quant Total Ab: <0.4 U/mL (ref <0.8)
SARS-CoV-2 Spike Ab Interp: NEGATIVE

## 2020-06-16 LAB — RPR: RPR Ser Ql: NONREACTIVE

## 2020-06-16 LAB — HEPATITIS PANEL, ACUTE
Hep A IgM: NEGATIVE
Hep B C IgM: NEGATIVE
Hep C Virus Ab: <0.1 s/co ratio (ref 0.0–0.9)
Hepatitis B Surface Ag: NEGATIVE

## 2020-06-16 LAB — TSH: TSH: 1.15 u[IU]/mL (ref 0.450–4.500)

## 2020-06-16 LAB — VITAMIN D 25 HYDROXY (VIT D DEFICIENCY, FRACTURES): Vit D, 25-Hydroxy: 38.4 ng/mL (ref 30.0–100.0)

## 2020-06-16 LAB — HIV ANTIBODY (ROUTINE TESTING W REFLEX): HIV Screen 4th Generation wRfx: NONREACTIVE

## 2020-06-17 LAB — PAP IG (IMAGE GUIDED)

## 2020-06-17 LAB — HPV APTIMA: HPV Aptima: POSITIVE — AB

## 2020-06-18 ENCOUNTER — Ambulatory Visit (INDEPENDENT_AMBULATORY_CARE_PROVIDER_SITE_OTHER): Payer: BC Managed Care – PPO | Admitting: Family Medicine

## 2020-06-18 ENCOUNTER — Other Ambulatory Visit: Payer: Self-pay

## 2020-06-18 ENCOUNTER — Encounter: Payer: Self-pay | Admitting: Family Medicine

## 2020-06-18 VITALS — BP 111/75 | HR 65 | Temp 98.2°F

## 2020-06-18 DIAGNOSIS — I10 Essential (primary) hypertension: Secondary | ICD-10-CM | POA: Diagnosis not present

## 2020-06-18 DIAGNOSIS — K219 Gastro-esophageal reflux disease without esophagitis: Secondary | ICD-10-CM

## 2020-06-18 DIAGNOSIS — I471 Supraventricular tachycardia: Secondary | ICD-10-CM | POA: Diagnosis not present

## 2020-06-18 DIAGNOSIS — G43709 Chronic migraine without aura, not intractable, without status migrainosus: Secondary | ICD-10-CM

## 2020-06-18 DIAGNOSIS — R87821 Vaginal low risk human papillomavirus (HPV) DNA test positive: Secondary | ICD-10-CM

## 2020-06-18 DIAGNOSIS — F419 Anxiety disorder, unspecified: Secondary | ICD-10-CM

## 2020-06-18 DIAGNOSIS — D508 Other iron deficiency anemias: Secondary | ICD-10-CM

## 2020-06-18 DIAGNOSIS — F331 Major depressive disorder, recurrent, moderate: Secondary | ICD-10-CM | POA: Diagnosis not present

## 2020-06-18 DIAGNOSIS — J45909 Unspecified asthma, uncomplicated: Secondary | ICD-10-CM

## 2020-06-18 DIAGNOSIS — R87622 Low grade squamous intraepithelial lesion on cytologic smear of vagina (LGSIL): Secondary | ICD-10-CM

## 2020-06-18 DIAGNOSIS — F330 Major depressive disorder, recurrent, mild: Secondary | ICD-10-CM

## 2020-06-18 DIAGNOSIS — J301 Allergic rhinitis due to pollen: Secondary | ICD-10-CM

## 2020-06-18 MED ORDER — RIZATRIPTAN BENZOATE 10 MG PO TABS: 10.0000 mg | ORAL_TABLET | ORAL | 3 refills | Status: DC | PRN

## 2020-06-18 MED ORDER — GABAPENTIN 300 MG PO CAPS
600.0000 mg | ORAL_CAPSULE | Freq: Two times a day (BID) | ORAL | 1 refills | Status: DC
Start: 2020-06-18 — End: 2020-12-20

## 2020-06-18 MED ORDER — FEXOFENADINE HCL 180 MG PO TABS
180.0000 mg | ORAL_TABLET | Freq: Every day | ORAL | 3 refills | Status: DC
Start: 2020-06-18 — End: 2021-02-11

## 2020-06-18 MED ORDER — ALBUTEROL SULFATE (2.5 MG/3ML) 0.083% IN NEBU: 2.5000 mg | INHALATION_SOLUTION | Freq: Four times a day (QID) | RESPIRATORY_TRACT | 6 refills | Status: DC | PRN

## 2020-06-18 MED ORDER — NORTRIPTYLINE HCL 25 MG PO CAPS: 25.0000 mg | ORAL_CAPSULE | Freq: Every day | ORAL | 3 refills | Status: DC

## 2020-06-18 MED ORDER — ALBUTEROL SULFATE HFA 108 (90 BASE) MCG/ACT IN AERS
INHALATION_SPRAY | RESPIRATORY_TRACT | 6 refills | Status: DC
Start: 2020-06-18 — End: 2020-12-20

## 2020-06-18 MED ORDER — OMEPRAZOLE 40 MG PO CPDR
40.0000 mg | DELAYED_RELEASE_CAPSULE | Freq: Two times a day (BID) | ORAL | 1 refills | Status: DC
Start: 2020-06-18 — End: 2020-12-20

## 2020-06-18 MED ORDER — MONTELUKAST SODIUM 10 MG PO TABS
10.0000 mg | ORAL_TABLET | Freq: Every day | ORAL | 1 refills | Status: DC
Start: 2020-06-18 — End: 2020-12-20

## 2020-06-18 MED ORDER — PROPRANOLOL HCL 40 MG PO TABS
40.0000 mg | ORAL_TABLET | Freq: Two times a day (BID) | ORAL | 1 refills | Status: DC
Start: 2020-06-18 — End: 2020-12-20

## 2020-06-18 NOTE — Assessment & Plan Note (Signed)
Following with psychiatry. Call with any concerns. Stable. Continue to follow with them.  °

## 2020-06-18 NOTE — Assessment & Plan Note (Signed)
Under good control on current regimen. Continue current regimen. Continue to monitor. Call with any concerns. Refills given. Labs drawn last time.  

## 2020-06-18 NOTE — Assessment & Plan Note (Signed)
Following with psychiatry. Call with any concerns. Stable. Continue to follow with them.

## 2020-06-18 NOTE — Assessment & Plan Note (Signed)
Will get her into GYN for likely colpo. Call with any concerns. Continue to monitor.

## 2020-06-18 NOTE — Assessment & Plan Note (Signed)
Under good control on current regimen. Continue current regimen. Continue to monitor. Call with any concerns. Refills given. Labs drawn last time.

## 2020-06-18 NOTE — Assessment & Plan Note (Signed)
Not under good control with migraines weekly. Will start nortriptyline and recheck 1 month. Call with any concerns. Continue to monitor.

## 2020-06-18 NOTE — Progress Notes (Signed)
BP 111/75 (BP Location: Left Arm, Patient Position: Sitting, Cuff Size: Normal)   Pulse 65   Temp 98.2 F (36.8 C) (Oral)   LMP 06/01/2020 (Exact Date)   SpO2 97%    Subjective:    Patient ID: Nancy Sanchez, female    DOB: Sep 01, 1971, 49 y.o.   MRN: [id number]  HPI: Nancy Sanchez is a 49 y.o. female  Chief Complaint  Patient presents with  . Hypertension  . Depression  . Anemia   HYPERTENSION Hypertension status: controlled  Satisfied with current treatment? yes Duration of hypertension: chronic BP monitoring frequency:  not checking BP medication side effects:  no Medication compliance: good compliance Previous BP meds: propranolol Aspirin: no Recurrent headaches: yes Visual changes: no Palpitations: yes Dyspnea: no Chest pain: no Lower extremity edema: no Dizzy/lightheaded: no  Migraines have been doing well. Generally a headache about 1x a week. Notes that it is incapacitating and located on the L side of her head behind her eye. + photophobia and nausea. Propranolol not decreasing them.   ANXIETY/DEPRESSION- seeing a psychiatrist, has been under good control.  Duration:better Anxious mood: yes  Excessive worrying: no Irritability: yes  Sweating: yes Nausea: yes Palpitations:yes Hyperventilation: no Panic attacks: yes Agoraphobia: no  Obscessions/compulsions: no Depressed mood: yes Depression screen [facility] 2/9 06/14/2020 12/19/2019 10/08/2019 06/13/2019 01/19/2019  Decreased Interest Down, Depressed, Hopeless 0 2 0 0 3  PHQ - 2 Score Altered sleeping Tired, decreased energy Change in appetite 0 0  Feeling bad or failure about yourself  0 1 0 0 0  Trouble concentrating Moving slowly or fidgety/restless 0 1 0 0 0  Suicidal thoughts 0 0 0 0 1  PHQ-9 Score Difficult doing work/chores - - Not difficult at all Not difficult at all Very difficult  Some recent data might be  hidden   Anhedonia: no Weight changes: no Insomnia: no   Hypersomnia: yes Fatigue/loss of energy: yes Feelings of worthlessness: yes Feelings of guilt: yes Impaired concentration/indecisiveness: yes Suicidal ideations: no  Crying spells: no Recent Stressors/Life Changes: yes   Relationship problems: yes   Family stress: no     Financial stress: yes    Job stress: yes    Recent death/loss: no  GERD GERD control status: controlled  Satisfied with current treatment? yes Heartburn frequency: not with the medicine Medication side effects: no  Medication compliance: excellent Dysphagia: no Odynophagia:  no Hematemesis: no Blood in stool: no  Relevant past medical, surgical, family and social history reviewed and updated as indicated. Interim medical history since our last visit reviewed. Allergies and medications reviewed and updated.  Review of Systems  Constitutional: Negative.   HENT: Negative.   Respiratory: Negative.   Cardiovascular: Negative.   Gastrointestinal: Negative.   Musculoskeletal: Positive for arthralgias, joint swelling and myalgias. Negative for back pain, gait problem, neck pain and neck stiffness.  Skin: Negative.   Neurological: Negative.   Psychiatric/Behavioral: Positive for dysphoric mood. Negative for agitation, behavioral problems, confusion, decreased concentration, hallucinations, self-injury, sleep disturbance and suicidal ideas. The patient is nervous/anxious. The patient is not hyperactive.     Per HPI unless specifically indicated above     Objective:    BP 111/75 (BP Location: Left Arm, Patient Position: Sitting, Cuff Size: Normal)  Pulse 65   Temp 98.2 F (36.8 C) (Oral)   LMP 06/01/2020 (Exact Date)   SpO2 97%   Wt Readings from Last 3 Encounters:  06/14/20 219 lb 3.2 oz (99.4 kg)  04/29/20 214 lb (97.1 kg)  02/23/20 209 lb 6 oz (95 kg)    Physical Exam Vitals and nursing note reviewed.  Constitutional:      General: She  is not in acute distress.    Appearance: Normal appearance. She is not ill-appearing, toxic-appearing or diaphoretic.  HENT:     Head: Normocephalic and atraumatic.     Right Ear: External ear normal.     Left Ear: External ear normal.     Nose: Nose normal.     Mouth/Throat:     Mouth: Mucous membranes are moist.     Pharynx: Oropharynx is clear.  Eyes:     General: No scleral icterus.       Right eye: No discharge.        Left eye: No discharge.     Extraocular Movements: Extraocular movements intact.     Conjunctiva/sclera: Conjunctivae normal.     Pupils: Pupils are equal, round, and reactive to light.  Cardiovascular:     Rate and Rhythm: Normal rate and regular rhythm.     Pulses: Normal pulses.     Heart sounds: Normal heart sounds. No murmur heard.  No friction rub. No gallop.   Pulmonary:     Effort: Pulmonary effort is normal. No respiratory distress.     Breath sounds: Normal breath sounds. No stridor. No wheezing, rhonchi or rales.  Chest:     Chest wall: No tenderness.  Musculoskeletal:        General: Normal range of motion.     Cervical back: Normal range of motion and neck supple.  Skin:    General: Skin is warm and dry.     Capillary Refill: Capillary refill takes less than 2 seconds.     Coloration: Skin is not jaundiced or pale.     Findings: No bruising, erythema, lesion or rash.  Neurological:     General: No focal deficit present.     Mental Status: She is alert and oriented to person, place, and time. Mental status is at baseline.  Psychiatric:        Mood and Affect: Mood normal.        Behavior: Behavior normal.        Thought Content: Thought content normal.        Judgment: Judgment normal.     Results for orders placed or performed in visit on 06/14/20  GC/Chlamydia Probe Amp   Specimen: Urine   UR  Result Value Ref Range   Chlamydia trachomatis, NAA Negative Negative   Neisseria Gonorrhoeae by PCR Negative Negative  Bayer DCA Hb A1c  Waived  Result Value Ref Range   HB A1C (BAYER DCA - WAIVED) 4.8 <7.0 %  CBC with Differential/Platelet  Result Value Ref Range   WBC 5.9 3.4 - 10.8 x10E3/uL   RBC 4.50 3.77 - 5.28 x10E6/uL   Hemoglobin 12.2 11.1 - 15.9 g/dL   Hematocrit 01.0 27.2 - 46.6 %   MCV 88 79 - 97 fL   MCH 27.1 26.6 - 33.0 pg   MCHC 30.7 (L) 31 - 35 g/dL   RDW 53.6 64.4 - 03.4 %   Platelets 336 150 - 450 x10E3/uL   Neutrophils 61 Not Estab. %   Lymphs 28 Not Estab. %  Monocytes 8 Not Estab. %   Eos 2 Not Estab. %   Basos 1 Not Estab. %   Neutrophils Absolute 3.6 1 - 7 x10E3/uL   Lymphocytes Absolute 1.7 0 - 3 x10E3/uL   Monocytes Absolute 0.5 0 - 0 x10E3/uL   EOS (ABSOLUTE) 0.1 0.0 - 0.4 x10E3/uL   Basophils Absolute 0.0 0 - 0 x10E3/uL   Immature Granulocytes 0 Not Estab. %   Immature Grans (Abs) 0.0 0.0 - 0.1 x10E3/uL  Comprehensive metabolic panel  Result Value Ref Range   Glucose 79 65 - 99 mg/dL   BUN 17 6 - 24 mg/dL   Creatinine, Ser 1.61 0.57 - 1.00 mg/dL   GFR calc non Af Amer 103 >59 mL/min/1.73   GFR calc Af Amer 119 >59 mL/min/1.73   BUN/Creatinine Ratio 25 (H) 9 - 23   Sodium 140 134 - 144 mmol/L   Potassium 4.6 3.5 - 5.2 mmol/L   Chloride 101 96 - 106 mmol/L   CO2 26 20 - 29 mmol/L   Calcium 9.2 8.7 - 10.2 mg/dL   Total Protein 7.1 6.0 - 8.5 g/dL   Albumin 4.4 3.8 - 4.8 g/dL   Globulin, Total 2.7 1.5 - 4.5 g/dL   Albumin/Globulin Ratio 1.6 1.2 - 2.2   Bilirubin Total 0.4 0.0 - 1.2 mg/dL   Alkaline Phosphatase 75 48 - 121 IU/L   AST 17 0 - 40 IU/L   ALT 14 0 - 32 IU/L  Lipid Panel w/o Chol/HDL Ratio  Result Value Ref Range   Cholesterol, Total 203 (H) 100 - 199 mg/dL   Triglycerides 096 0 - 149 mg/dL   HDL 72 >04 mg/dL   VLDL Cholesterol Cal 25 5 - 40 mg/dL   LDL Chol Calc (NIH) 540 (H) 0 - 99 mg/dL  TSH  Result Value Ref Range   TSH 1.150 0.450 - 4.500 uIU/mL  Urinalysis, Routine w reflex microscopic  Result Value Ref Range   Specific Gravity, UA <1.005 (L) 1.005 -  1.030   pH, UA 6.0 5.0 - 7.5   Color, UA Yellow Yellow   Appearance Ur Clear Clear   Leukocytes,UA Negative Negative   Protein,UA Negative Negative/Trace   Glucose, UA Negative Negative   Ketones, UA Negative Negative   RBC, UA Negative Negative   Bilirubin, UA Negative Negative   Urobilinogen, Ur 0.2 0.2 - 1.0 mg/dL   Nitrite, UA Negative Negative  HIV Antibody (routine testing w rflx)  Result Value Ref Range   HIV Screen 4th Generation wRfx Non Reactive Non Reactive  Hepatitis, Acute  Result Value Ref Range   Hep A IgM Negative Negative   Hepatitis B Surface Ag Negative Negative   Hep B C IgM Negative Negative   Hep C Virus Ab <0.1 0.0 - 0.9 s/co ratio  RPR  Result Value Ref Range   RPR Ser Ql Non Reactive Non Reactive  HSV(herpes simplex vrs) 1+2 ab-IgG  Result Value Ref Range   HSV 1 Glycoprotein G Ab, IgG 1.07 (H) 0.00 - 0.90 index   HSV 2 IgG, Type Spec 10.70 (H) 0.00 - 0.90 index  SARS-CoV-2 Semi-Quantitative Total Antibody, Spike  Result Value Ref Range   SARS-CoV-2 Semi-Quant Total Ab <0.4 Negative<0.8 U/mL   SARS-CoV-2 Spike Ab Interp Negative   VITAMIN D 25 Hydroxy (Vit-D Deficiency, Fractures)  Result Value Ref Range   Vit D, 25-Hydroxy 38.4 30.0 - 100.0 ng/mL  Pap IG (Image Guided)  Result Value Ref Range   Interpretation EPCA,CHVIRB (  A)    Category LSIL (A)    Adequacy ENDO    Clinician Provided ICD10 Comment    Performed by: Comment    Electronically signed by: Comment    PATHOLOGIST PROVIDED ICD10: Comment    Note: Comment    Test Methodology Comment   HPV Aptima  Result Value Ref Range   HPV Aptima Positive (A) Negative      Assessment & Plan:   Problem List Items Addressed This Visit      Cardiovascular and Mediastinum   Paroxysmal supraventricular tachycardia (HCC)    Under good control on current regimen. Continue current regimen. Continue to monitor. Call with any concerns. Refills given. Labs drawn last time.       Relevant Medications    propranolol (INDERAL) 40 MG tablet   Hypertension - Primary    Under good control on current regimen. Continue current regimen. Continue to monitor. Call with any concerns. Refills given. Labs drawn last time.       Relevant Medications   propranolol (INDERAL) 40 MG tablet   Migraines    Not under good control with migraines weekly. Will start nortriptyline and recheck 1 month. Call with any concerns. Continue to monitor.       Relevant Medications   gabapentin (NEURONTIN) 300 MG capsule   propranolol (INDERAL) 40 MG tablet   rizatriptan (MAXALT) 10 MG tablet   nortriptyline (PAMELOR) 25 MG capsule     Respiratory   Asthma    Under good control on current regimen. Continue current regimen. Continue to monitor. Call with any concerns. Refills given. Labs drawn last time.       Relevant Medications   montelukast (SINGULAIR) 10 MG tablet   albuterol (PROVENTIL) (2.5 MG/3ML) 0.083% nebulizer solution   albuterol (VENTOLIN HFA) 108 (90 Base) MCG/ACT inhaler   Seasonal allergic rhinitis    Under good control on current regimen. Continue current regimen. Continue to monitor. Call with any concerns. Refills given. Labs drawn last time.         Digestive   GERD (gastroesophageal reflux disease)    Under good control on current regimen. Continue current regimen. Continue to monitor. Call with any concerns. Refills given. Labs drawn last time.        Relevant Medications   omeprazole (PRILOSEC) 40 MG capsule     Other   Iron deficiency anemia    Under good control on current regimen. Continue current regimen. Continue to monitor. Call with any concerns. Refills given. Labs drawn last time.       Anxiety    Following with psychiatry. Call with any concerns. Stable. Continue to follow with them.       Relevant Medications   nortriptyline (PAMELOR) 25 MG capsule   Vaginal low risk HPV DNA test positive    Will get her into GYN for likely colpo. Call with any concerns.  Continue to monitor.       Relevant Orders   Ambulatory referral to Obstetrics / Gynecology   Mild episode of recurrent major depressive disorder [facility])    Following with psychiatry. Call with any concerns. Stable. Continue to follow with them.       Relevant Medications   nortriptyline (PAMELOR) 25 MG capsule   RESOLVED: Depression   Relevant Medications   nortriptyline (PAMELOR) 25 MG capsule    Other Visit Diagnoses    LGSIL Pap smear of vagina       Will get her into GYN for likely colpo. Call  with any concerns. Continue to monitor.    Relevant Orders   Ambulatory referral to Obstetrics / Gynecology       Follow up plan: Return in about 4 weeks (around 07/16/2020) for follow up migraine, video OK.

## 2020-07-02 ENCOUNTER — Other Ambulatory Visit: Payer: Self-pay | Admitting: Nurse Practitioner

## 2020-07-10 ENCOUNTER — Encounter: Payer: Self-pay | Admitting: Family Medicine

## 2020-07-14 DIAGNOSIS — M25559 Pain in unspecified hip: Secondary | ICD-10-CM | POA: Diagnosis not present

## 2020-07-14 DIAGNOSIS — M25561 Pain in right knee: Secondary | ICD-10-CM | POA: Diagnosis not present

## 2020-07-14 DIAGNOSIS — Z87891 Personal history of nicotine dependence: Secondary | ICD-10-CM | POA: Diagnosis not present

## 2020-07-14 DIAGNOSIS — M160 Bilateral primary osteoarthritis of hip: Secondary | ICD-10-CM | POA: Diagnosis not present

## 2020-07-14 DIAGNOSIS — M25752 Osteophyte, left hip: Secondary | ICD-10-CM | POA: Diagnosis not present

## 2020-07-14 DIAGNOSIS — Z96652 Presence of left artificial knee joint: Secondary | ICD-10-CM | POA: Diagnosis not present

## 2020-07-14 DIAGNOSIS — M1711 Unilateral primary osteoarthritis, right knee: Secondary | ICD-10-CM | POA: Diagnosis not present

## 2020-07-14 DIAGNOSIS — M1612 Unilateral primary osteoarthritis, left hip: Secondary | ICD-10-CM | POA: Diagnosis not present

## 2020-07-15 DIAGNOSIS — R87612 Low grade squamous intraepithelial lesion on cytologic smear of cervix (LGSIL): Secondary | ICD-10-CM | POA: Diagnosis not present

## 2020-07-15 DIAGNOSIS — Z886 Allergy status to analgesic agent status: Secondary | ICD-10-CM | POA: Diagnosis not present

## 2020-07-15 DIAGNOSIS — Z9884 Bariatric surgery status: Secondary | ICD-10-CM | POA: Diagnosis not present

## 2020-07-15 DIAGNOSIS — Z87891 Personal history of nicotine dependence: Secondary | ICD-10-CM | POA: Diagnosis not present

## 2020-07-15 DIAGNOSIS — I10 Essential (primary) hypertension: Secondary | ICD-10-CM | POA: Diagnosis not present

## 2020-07-15 DIAGNOSIS — F419 Anxiety disorder, unspecified: Secondary | ICD-10-CM | POA: Diagnosis not present

## 2020-07-15 DIAGNOSIS — N72 Inflammatory disease of cervix uteri: Secondary | ICD-10-CM | POA: Diagnosis not present

## 2020-07-15 DIAGNOSIS — Z3202 Encounter for pregnancy test, result negative: Secondary | ICD-10-CM | POA: Diagnosis not present

## 2020-07-19 ENCOUNTER — Encounter: Payer: Self-pay | Admitting: Family Medicine

## 2020-07-19 ENCOUNTER — Telehealth (INDEPENDENT_AMBULATORY_CARE_PROVIDER_SITE_OTHER): Payer: BC Managed Care – PPO | Admitting: Family Medicine

## 2020-07-19 VITALS — Wt 213.0 lb

## 2020-07-19 DIAGNOSIS — M1990 Unspecified osteoarthritis, unspecified site: Secondary | ICD-10-CM

## 2020-07-19 DIAGNOSIS — M2550 Pain in unspecified joint: Secondary | ICD-10-CM | POA: Diagnosis not present

## 2020-07-19 DIAGNOSIS — G43709 Chronic migraine without aura, not intractable, without status migrainosus: Secondary | ICD-10-CM

## 2020-07-19 NOTE — Progress Notes (Signed)
Wt 213 lb (96.6 kg)   BMI 37.73 kg/m    Subjective:    Patient ID: Nancy Sanchez, female    DOB: 1971-09-25, 49 y.o.   MRN: [id number]  HPI: Nancy Sanchez is a 50 y.o. female  Chief Complaint  Patient presents with  . Migraine    pt states stopped taking the nortriptilyne about a week ago due to side effect joints pain   Stopped the nortriptyline for about a week due to joint pain. Hasn't had any headaches in the last 2 weeks.   ARTHRALGIAS / JOINT ACHES Duration: chronic Pain: yes Symmetric: yes Severity: severe Quality: dull, aching and burning Frequency: constant Context:  worse Decreased function/range of motion: yes  Erythema: no Swelling: no Heat or warmth: no Morning stiffness: yes Relief with NSAIDs?: moderate Treatments attempted:  rest, ice, heat, APAP, ibuprofen, aleve and physical therapy  Involved Joints:     Hands: yes right    Wrists: yes right     Elbows: no     Shoulders: yes bilateral    Back: yes     Hips: yes bilateral    Knees: yes bilateral    Ankles: yes right    Feet: yes bilateral   Relevant past medical, surgical, family and social history reviewed and updated as indicated. Interim medical history since our last visit reviewed. Allergies and medications reviewed and updated.  Review of Systems  Constitutional: Negative.   HENT: Negative.   Respiratory: Negative.   Cardiovascular: Negative.   Gastrointestinal: Negative.   Musculoskeletal: Positive for arthralgias, back pain, gait problem and myalgias. Negative for joint swelling, neck pain and neck stiffness.  Skin: Negative.   Neurological: Negative for dizziness, tremors, seizures, syncope, facial asymmetry, speech difficulty, weakness, light-headedness, numbness and headaches.  Psychiatric/Behavioral: Negative.     Per HPI unless specifically indicated above     Objective:    Wt 213 lb (96.6 kg)   BMI 37.73 kg/m   Wt Readings from Last 3 Encounters:    07/19/20 213 lb (96.6 kg)  06/14/20 219 lb 3.2 oz (99.4 kg)  04/29/20 214 lb (97.1 kg)    Physical Exam Vitals and nursing note reviewed.  Constitutional:      General: She is not in acute distress.    Appearance: Normal appearance. She is not ill-appearing, toxic-appearing or diaphoretic.  HENT:     Head: Normocephalic and atraumatic.     Right Ear: External ear normal.     Left Ear: External ear normal.     Nose: Nose normal.     Mouth/Throat:     Mouth: Mucous membranes are moist.     Pharynx: Oropharynx is clear.  Eyes:     General: No scleral icterus.       Right eye: No discharge.        Left eye: No discharge.     Conjunctiva/sclera: Conjunctivae normal.     Pupils: Pupils are equal, round, and reactive to light.  Pulmonary:     Effort: Pulmonary effort is normal. No respiratory distress.     Comments: Speaking in full sentences Musculoskeletal:        General: Normal range of motion.     Cervical back: Normal range of motion.  Skin:    Coloration: Skin is not jaundiced or pale.     Findings: No bruising, erythema, lesion or rash.  Neurological:     Mental Status: She is alert and oriented to person, place, and time. Mental  status is at baseline.  Psychiatric:        Mood and Affect: Mood normal.        Behavior: Behavior normal.        Thought Content: Thought content normal.        Judgment: Judgment normal.     Results for orders placed or performed in visit on 06/14/20  GC/Chlamydia Probe Amp   Specimen: Urine   UR  Result Value Ref Range   Chlamydia trachomatis, NAA Negative Negative   Neisseria Gonorrhoeae by PCR Negative Negative  Bayer DCA Hb A1c Waived  Result Value Ref Range   HB A1C (BAYER DCA - WAIVED) 4.8 <7.0 %  CBC with Differential/Platelet  Result Value Ref Range   WBC 5.9 3.4 - 10.8 x10E3/uL   RBC 4.50 3.77 - 5.28 x10E6/uL   Hemoglobin 12.2 11.1 - 15.9 g/dL   Hematocrit 03.5 00.9 - 46.6 %   MCV 88 79 - 97 fL   MCH 27.1 26.6 - 33.0  pg   MCHC 30.7 (L) 31 - 35 g/dL   RDW 38.1 82.9 - 93.7 %   Platelets 336 150 - 450 x10E3/uL   Neutrophils 61 Not Estab. %   Lymphs 28 Not Estab. %   Monocytes 8 Not Estab. %   Eos 2 Not Estab. %   Basos 1 Not Estab. %   Neutrophils Absolute 3.6 1 - 7 x10E3/uL   Lymphocytes Absolute 1.7 0 - 3 x10E3/uL   Monocytes Absolute 0.5 0 - 0 x10E3/uL   EOS (ABSOLUTE) 0.1 0.0 - 0.4 x10E3/uL   Basophils Absolute 0.0 0 - 0 x10E3/uL   Immature Granulocytes 0 Not Estab. %   Immature Grans (Abs) 0.0 0.0 - 0.1 x10E3/uL  Comprehensive metabolic panel  Result Value Ref Range   Glucose 79 65 - 99 mg/dL   BUN 17 6 - 24 mg/dL   Creatinine, Ser 1.69 0.57 - 1.00 mg/dL   GFR calc non Af Amer 103 >59 mL/min/1.73   GFR calc Af Amer 119 >59 mL/min/1.73   BUN/Creatinine Ratio 25 (H) 9 - 23   Sodium 140 134 - 144 mmol/L   Potassium 4.6 3.5 - 5.2 mmol/L   Chloride 101 96 - 106 mmol/L   CO2 26 20 - 29 mmol/L   Calcium 9.2 8.7 - 10.2 mg/dL   Total Protein 7.1 6.0 - 8.5 g/dL   Albumin 4.4 3.8 - 4.8 g/dL   Globulin, Total 2.7 1.5 - 4.5 g/dL   Albumin/Globulin Ratio 1.6 1.2 - 2.2   Bilirubin Total 0.4 0.0 - 1.2 mg/dL   Alkaline Phosphatase 75 48 - 121 IU/L   AST 17 0 - 40 IU/L   ALT 14 0 - 32 IU/L  Lipid Panel w/o Chol/HDL Ratio  Result Value Ref Range   Cholesterol, Total 203 (H) 100 - 199 mg/dL   Triglycerides 678 0 - 149 mg/dL   HDL 72 >93 mg/dL   VLDL Cholesterol Cal 25 5 - 40 mg/dL   LDL Chol Calc (NIH) 810 (H) 0 - 99 mg/dL  TSH  Result Value Ref Range   TSH 1.150 0.450 - 4.500 uIU/mL  Urinalysis, Routine w reflex microscopic  Result Value Ref Range   Specific Gravity, UA <1.005 (L) 1.005 - 1.030   pH, UA 6.0 5.0 - 7.5   Color, UA Yellow Yellow   Appearance Ur Clear Clear   Leukocytes,UA Negative Negative   Protein,UA Negative Negative/Trace   Glucose, UA Negative Negative   Ketones,  UA Negative Negative   RBC, UA Negative Negative   Bilirubin, UA Negative Negative   Urobilinogen, Ur 0.2  0.2 - 1.0 mg/dL   Nitrite, UA Negative Negative  HIV Antibody (routine testing w rflx)  Result Value Ref Range   HIV Screen 4th Generation wRfx Non Reactive Non Reactive  Hepatitis, Acute  Result Value Ref Range   Hep A IgM Negative Negative   Hepatitis B Surface Ag Negative Negative   Hep B C IgM Negative Negative   Hep C Virus Ab <0.1 0.0 - 0.9 s/co ratio  RPR  Result Value Ref Range   RPR Ser Ql Non Reactive Non Reactive  HSV(herpes simplex vrs) 1+2 ab-IgG  Result Value Ref Range   HSV 1 Glycoprotein G Ab, IgG 1.07 (H) 0.00 - 0.90 index   HSV 2 IgG, Type Spec 10.70 (H) 0.00 - 0.90 index  SARS-CoV-2 Semi-Quantitative Total Antibody, Spike  Result Value Ref Range   SARS-CoV-2 Semi-Quant Total Ab <0.4 Negative<0.8 U/mL   SARS-CoV-2 Spike Ab Interp Negative   VITAMIN D 25 Hydroxy (Vit-D Deficiency, Fractures)  Result Value Ref Range   Vit D, 25-Hydroxy 38.4 30.0 - 100.0 ng/mL  Pap IG (Image Guided)  Result Value Ref Range   Interpretation EPCA,CHVIRB (A)    Category LSIL (A)    Adequacy ENDO    Clinician Provided ICD10 Comment    Performed by: Comment    Electronically signed by: Comment    PATHOLOGIST PROVIDED ICD10: Comment    Note: Comment    Test Methodology Comment   HPV Aptima  Result Value Ref Range   HPV Aptima Positive (A) Negative      Assessment & Plan:   Problem List Items Addressed This Visit      Cardiovascular and Mediastinum   Migraines - Primary    Doing better. Off her nortriptyline. Will continue off of it. Call if starting to getting bad again and we will consider restarting.        Other Visit Diagnoses    Arthralgia, unspecified joint       Likely due to osteoarthritis. RA labs negative in May. Will recheck uric acid. Encouraged follow up with ortho. Offered referral to rheumatology- declined.   Relevant Orders   Uric acid   Osteoarthritis, unspecified osteoarthritis type, unspecified site       Extensive with joint replacements.  Encouraged follow up with ortho and offered referral to rheumatology which was declined. Continue to monitor.        Follow up plan: Return if symptoms worsen or fail to improve.   . This visit was completed via MyChart due to the restrictions of the COVID-19 pandemic. All issues as above were discussed and addressed. Physical exam was done as above through visual confirmation on MyChart. If it was felt that the patient should be evaluated in the office, they were directed there. The patient verbally consented to this visit. . Location of the patient: parking lot . Location of the provider: work . Those involved with this call:  . Provider: Olevia Perches, DO . CMA: Elton Sin, CMA . Front Desk/Registration: Adela Ports  . Time spent on call: 25 minutes with patient face to face via video conference. More than 50% of this time was spent in counseling and coordination of care. 40 minutes total spent in review of patient's record and preparation of their chart.

## 2020-07-19 NOTE — Assessment & Plan Note (Signed)
Doing better. Off her nortriptyline. Will continue off of it. Call if starting to getting bad again and we will consider restarting.

## 2020-07-21 DIAGNOSIS — F419 Anxiety disorder, unspecified: Secondary | ICD-10-CM | POA: Diagnosis not present

## 2020-07-21 DIAGNOSIS — M810 Age-related osteoporosis without current pathological fracture: Secondary | ICD-10-CM | POA: Diagnosis not present

## 2020-07-21 DIAGNOSIS — S59911A Unspecified injury of right forearm, initial encounter: Secondary | ICD-10-CM | POA: Diagnosis not present

## 2020-07-21 DIAGNOSIS — M1990 Unspecified osteoarthritis, unspecified site: Secondary | ICD-10-CM | POA: Diagnosis not present

## 2020-07-21 DIAGNOSIS — S4991XA Unspecified injury of right shoulder and upper arm, initial encounter: Secondary | ICD-10-CM | POA: Diagnosis not present

## 2020-07-21 DIAGNOSIS — J45909 Unspecified asthma, uncomplicated: Secondary | ICD-10-CM | POA: Diagnosis not present

## 2020-07-21 DIAGNOSIS — D649 Anemia, unspecified: Secondary | ICD-10-CM | POA: Diagnosis not present

## 2020-07-21 DIAGNOSIS — K219 Gastro-esophageal reflux disease without esophagitis: Secondary | ICD-10-CM | POA: Diagnosis not present

## 2020-07-21 DIAGNOSIS — I10 Essential (primary) hypertension: Secondary | ICD-10-CM | POA: Diagnosis not present

## 2020-07-21 DIAGNOSIS — Z8742 Personal history of other diseases of the female genital tract: Secondary | ICD-10-CM | POA: Diagnosis not present

## 2020-07-21 DIAGNOSIS — G43909 Migraine, unspecified, not intractable, without status migrainosus: Secondary | ICD-10-CM | POA: Diagnosis not present

## 2020-07-21 DIAGNOSIS — Z87891 Personal history of nicotine dependence: Secondary | ICD-10-CM | POA: Diagnosis not present

## 2020-07-21 DIAGNOSIS — R29898 Other symptoms and signs involving the musculoskeletal system: Secondary | ICD-10-CM | POA: Diagnosis not present

## 2020-07-21 DIAGNOSIS — Z9884 Bariatric surgery status: Secondary | ICD-10-CM | POA: Diagnosis not present

## 2020-07-21 DIAGNOSIS — M79601 Pain in right arm: Secondary | ICD-10-CM | POA: Diagnosis not present

## 2020-07-21 DIAGNOSIS — M79631 Pain in right forearm: Secondary | ICD-10-CM | POA: Diagnosis not present

## 2020-07-21 DIAGNOSIS — Z885 Allergy status to narcotic agent status: Secondary | ICD-10-CM | POA: Diagnosis not present

## 2020-07-21 DIAGNOSIS — Z7951 Long term (current) use of inhaled steroids: Secondary | ICD-10-CM | POA: Diagnosis not present

## 2020-07-28 DIAGNOSIS — H40013 Open angle with borderline findings, low risk, bilateral: Secondary | ICD-10-CM | POA: Diagnosis not present

## 2020-07-30 DIAGNOSIS — B351 Tinea unguium: Secondary | ICD-10-CM | POA: Diagnosis not present

## 2020-08-04 DIAGNOSIS — Z1231 Encounter for screening mammogram for malignant neoplasm of breast: Secondary | ICD-10-CM | POA: Diagnosis not present

## 2020-08-13 ENCOUNTER — Encounter: Payer: Self-pay | Admitting: Family Medicine

## 2020-08-13 DIAGNOSIS — Z111 Encounter for screening for respiratory tuberculosis: Secondary | ICD-10-CM

## 2020-08-19 ENCOUNTER — Ambulatory Visit (INDEPENDENT_AMBULATORY_CARE_PROVIDER_SITE_OTHER): Payer: BC Managed Care – PPO | Admitting: Family Medicine

## 2020-08-19 ENCOUNTER — Other Ambulatory Visit: Payer: Self-pay

## 2020-08-19 ENCOUNTER — Encounter: Payer: Self-pay | Admitting: Family Medicine

## 2020-08-19 VITALS — BP 110/75 | HR 60 | Temp 98.2°F | Ht 62.0 in | Wt 220.0 lb

## 2020-08-19 DIAGNOSIS — M2550 Pain in unspecified joint: Secondary | ICD-10-CM

## 2020-08-19 DIAGNOSIS — Z111 Encounter for screening for respiratory tuberculosis: Secondary | ICD-10-CM

## 2020-08-19 DIAGNOSIS — M1990 Unspecified osteoarthritis, unspecified site: Secondary | ICD-10-CM | POA: Diagnosis not present

## 2020-08-19 MED ORDER — CYCLOBENZAPRINE HCL 5 MG PO TABS: 5.0000 mg | ORAL_TABLET | Freq: Two times a day (BID) | ORAL | 1 refills | Status: DC | PRN

## 2020-08-19 MED ORDER — DICLOFENAC SODIUM 1 % EX GEL: 4.0000 g | Freq: Four times a day (QID) | CUTANEOUS | 12 refills | Status: DC

## 2020-08-19 NOTE — Progress Notes (Signed)
BP 110/75   Pulse 60   Temp 98.2 F (36.8 C) (Oral)   Ht 5\' 2"  (1.575 m)   Wt 220 lb (99.8 kg)   LMP  (Exact Date)   BMI 40.24 kg/m    Subjective:    Patient ID: , female    DOB: 05-19-1971, 49 y.o.   MRN: [id number]  HPI: Nancy Sanchez is a 49 y.o. female  Chief Complaint  Patient presents with  . Joint Pain    Declined flu shot. Needs Quantiferon Gold for Work and labs for joint pain.   ARTHRALGIAS / JOINT ACHES Duration: chronic Pain: yes Symmetric: no Severity: severe Quality: aching, dull Frequency: constant Context:  worse Decreased function/range of motion: yes  Erythema: no Swelling: no Heat or warmth: no Morning stiffness: yes Relief with NSAIDs?: No NSAIDs Taken Treatments attempted:  rest, ice, heat, APAP, ibuprofen and aleve  Involved Joints:     Hands: no     Wrists: no      Elbows: no     Shoulders: yes     Back: yes     Hips: yes bilateral    Knees: yes bilateral    Ankles: yes bilateral    Feet: yes bilateral  Relevant past medical, surgical, family and social history reviewed and updated as indicated. Interim medical history since our last visit reviewed. Allergies and medications reviewed and updated.  Review of Systems  Constitutional: Negative.   Respiratory: Negative.   Cardiovascular: Negative.   Gastrointestinal: Negative.   Musculoskeletal: Positive for arthralgias, back pain and myalgias. Negative for gait problem, joint swelling, neck pain and neck stiffness.  Skin: Negative.   Neurological: Negative.   Psychiatric/Behavioral: Negative.     Per HPI unless specifically indicated above     Objective:    BP 110/75   Pulse 60   Temp 98.2 F (36.8 C) (Oral)   Ht 5\' 2"  (1.575 m)   Wt 220 lb (99.8 kg)   LMP  (Exact Date)   BMI 40.24 kg/m   Wt Readings from Last 3 Encounters:  08/19/20 220 lb (99.8 kg)  07/19/20 213 lb (96.6 kg)  06/14/20 219 lb 3.2 oz (99.4 kg)    Physical Exam Vitals and  nursing note reviewed.  Constitutional:      General: She is not in acute distress.    Appearance: Normal appearance. She is not ill-appearing, toxic-appearing or diaphoretic.  HENT:     Head: Normocephalic and atraumatic.     Right Ear: External ear normal.     Left Ear: External ear normal.     Nose: Nose normal.     Mouth/Throat:     Mouth: Mucous membranes are moist.     Pharynx: Oropharynx is clear.  Eyes:     General: No scleral icterus.       Right eye: No discharge.        Left eye: No discharge.     Extraocular Movements: Extraocular movements intact.     Conjunctiva/sclera: Conjunctivae normal.     Pupils: Pupils are equal, round, and reactive to light.  Cardiovascular:     Rate and Rhythm: Normal rate and regular rhythm.     Pulses: Normal pulses.     Heart sounds: Normal heart sounds. No murmur heard.  No friction rub. No gallop.   Pulmonary:     Effort: Pulmonary effort is normal. No respiratory distress.     Breath sounds: Normal breath sounds. No stridor. No  wheezing, rhonchi or rales.  Chest:     Chest wall: No tenderness.  Musculoskeletal:        General: Normal range of motion.     Cervical back: Normal range of motion and neck supple.  Skin:    General: Skin is warm and dry.     Capillary Refill: Capillary refill takes less than 2 seconds.     Coloration: Skin is not jaundiced or pale.     Findings: No bruising, erythema, lesion or rash.  Neurological:     General: No focal deficit present.     Mental Status: She is alert and oriented to person, place, and time. Mental status is at baseline.  Psychiatric:        Mood and Affect: Mood normal.        Behavior: Behavior normal.        Thought Content: Thought content normal.        Judgment: Judgment normal.     Results for orders placed or performed in visit on 06/14/20  GC/Chlamydia Probe Amp   Specimen: Urine   UR  Result Value Ref Range   Chlamydia trachomatis, NAA Negative Negative    Neisseria Gonorrhoeae by PCR Negative Negative  Bayer DCA Hb A1c Waived  Result Value Ref Range   HB A1C (BAYER DCA - WAIVED) 4.8 <7.0 %  CBC with Differential/Platelet  Result Value Ref Range   WBC 5.9 3.4 - 10.8 x10E3/uL   RBC 4.50 3.77 - 5.28 x10E6/uL   Hemoglobin 12.2 11.1 - 15.9 g/dL   Hematocrit 35.3 61.4 - 46.6 %   MCV 88 79 - 97 fL   MCH 27.1 26.6 - 33.0 pg   MCHC 30.7 (L) 31 - 35 g/dL   RDW 43.1 54.0 - 08.6 %   Platelets 336 150 - 450 x10E3/uL   Neutrophils 61 Not Estab. %   Lymphs 28 Not Estab. %   Monocytes 8 Not Estab. %   Eos 2 Not Estab. %   Basos 1 Not Estab. %   Neutrophils Absolute 3.6 1.40 - 7.00 x10E3/uL   Lymphocytes Absolute 1.7 0 - 3 x10E3/uL   Monocytes Absolute 0.5 0 - 0 x10E3/uL   EOS (ABSOLUTE) 0.1 0.0 - 0.4 x10E3/uL   Basophils Absolute 0.0 0 - 0 x10E3/uL   Immature Granulocytes 0 Not Estab. %   Immature Grans (Abs) 0.0 0.0 - 0.1 x10E3/uL  Comprehensive metabolic panel  Result Value Ref Range   Glucose 79 65 - 99 mg/dL   BUN 17 6 - 24 mg/dL   Creatinine, Ser 7.61 0.57 - 1.00 mg/dL   GFR calc non Af Amer 103 >59 mL/min/1.73   GFR calc Af Amer 119 >59 mL/min/1.73   BUN/Creatinine Ratio 25 (H) 9 - 23   Sodium 140 134 - 144 mmol/L   Potassium 4.6 3.5 - 5.2 mmol/L   Chloride 101 96 - 106 mmol/L   CO2 26 20 - 29 mmol/L   Calcium 9.2 8.7 - 10.2 mg/dL   Total Protein 7.1 6.0 - 8.5 g/dL   Albumin 4.4 3.8 - 4.8 g/dL   Globulin, Total 2.7 1.5 - 4.5 g/dL   Albumin/Globulin Ratio 1.6 1.2 - 2.2   Bilirubin Total 0.4 0.0 - 1.2 mg/dL   Alkaline Phosphatase 75 48 - 121 IU/L   AST 17 0 - 40 IU/L   ALT 14 0 - 32 IU/L  Lipid Panel w/o Chol/HDL Ratio  Result Value Ref Range   Cholesterol, Total 203 (H)  100 - 199 mg/dL   Triglycerides 237 0 - 149 mg/dL   HDL 72 >62 mg/dL   VLDL Cholesterol Cal 25 5 - 40 mg/dL   LDL Chol Calc (NIH) 831 (H) 0 - 99 mg/dL  TSH  Result Value Ref Range   TSH 1.150 0.450 - 4.500 uIU/mL  Urinalysis, Routine w reflex microscopic   Result Value Ref Range   Specific Gravity, UA <1.005 (L) 1.005 - 1.030   pH, UA 6.0 5.0 - 7.5   Color, UA Yellow Yellow   Appearance Ur Clear Clear   Leukocytes,UA Negative Negative   Protein,UA Negative Negative/Trace   Glucose, UA Negative Negative   Ketones, UA Negative Negative   RBC, UA Negative Negative   Bilirubin, UA Negative Negative   Urobilinogen, Ur 0.2 0.2 - 1.0 mg/dL   Nitrite, UA Negative Negative  HIV Antibody (routine testing w rflx)  Result Value Ref Range   HIV Screen 4th Generation wRfx Non Reactive Non Reactive  Hepatitis, Acute  Result Value Ref Range   Hep A IgM Negative Negative   Hepatitis B Surface Ag Negative Negative   Hep B C IgM Negative Negative   Hep C Virus Ab <0.1 0.0 - 0.9 s/co ratio  RPR  Result Value Ref Range   RPR Ser Ql Non Reactive Non Reactive  HSV(herpes simplex vrs) 1+2 ab-IgG  Result Value Ref Range   HSV 1 Glycoprotein G Ab, IgG 1.07 (H) 0.00 - 0.90 index   HSV 2 IgG, Type Spec 10.70 (H) 0.00 - 0.90 index  SARS-CoV-2 Semi-Quantitative Total Antibody, Spike  Result Value Ref Range   SARS-CoV-2 Semi-Quant Total Ab <0.4 Negative<0.8 U/mL   SARS-CoV-2 Spike Ab Interp Negative   VITAMIN D 25 Hydroxy (Vit-D Deficiency, Fractures)  Result Value Ref Range   Vit D, 25-Hydroxy 38.4 30.0 - 100.0 ng/mL  Pap IG (Image Guided)  Result Value Ref Range   Interpretation EPCA,CHVIRB (A)    Category LSIL (A)    Adequacy ENDO    Clinician Provided ICD10 Comment    Performed by: Comment    Electronically signed by: Comment    PATHOLOGIST PROVIDED ICD10: Comment    Note: Comment    Test Methodology Comment   HPV Aptima  Result Value Ref Range   HPV Aptima Positive (A) Negative      Assessment & Plan:   Problem List Items Addressed This Visit      Musculoskeletal and Integument   Osteoarthritis - Primary    Will get her into PT. Voltaren and flexeril. Call with any concerns. Encouraged her to follow up with ortho. Call with any  concerns.      Relevant Medications   cyclobenzaprine (FLEXERIL) 5 MG tablet   Other Relevant Orders   Ambulatory referral to Physical Therapy     Other   Morbid obesity (HCC)    Encouraged diet and exercise with goal of losing 1-2lbs per week. Will start PT/aquatherapy. Call with any concerns. Continue to monitor.        Other Visit Diagnoses    Screening for tuberculosis       Quantiferon drawn today.    Arthralgia, unspecified joint       Likely due to osteoarthritis. RA labs negative in May. Will recheck uric acid. Encouraged follow up with ortho. Offered referral to rheumatology- declined.       Follow up plan: Return for February, 6 month follow up.

## 2020-08-19 NOTE — Assessment & Plan Note (Signed)
Encouraged diet and exercise with goal of losing 1-2lbs per week. Will start PT/aquatherapy. Call with any concerns. Continue to monitor.

## 2020-08-19 NOTE — Assessment & Plan Note (Signed)
Will get her into PT. Voltaren and flexeril. Call with any concerns. Encouraged her to follow up with ortho. Call with any concerns.

## 2020-08-20 LAB — URIC ACID: Uric Acid: 4.2 mg/dL (ref 2.6–6.2)

## 2020-08-22 LAB — QUANTIFERON-TB GOLD PLUS
QuantiFERON Mitogen Value: >10 IU/mL
QuantiFERON Nil Value: 0 IU/mL
QuantiFERON TB1 Ag Value: 0.01 IU/mL
QuantiFERON TB2 Ag Value: 0 IU/mL
QuantiFERON-TB Gold Plus: NEGATIVE

## 2020-09-02 DIAGNOSIS — D500 Iron deficiency anemia secondary to blood loss (chronic): Secondary | ICD-10-CM | POA: Diagnosis not present

## 2020-09-03 DIAGNOSIS — Z01818 Encounter for other preprocedural examination: Secondary | ICD-10-CM | POA: Diagnosis not present

## 2020-09-03 DIAGNOSIS — I10 Essential (primary) hypertension: Secondary | ICD-10-CM | POA: Diagnosis not present

## 2020-09-03 DIAGNOSIS — J4520 Mild intermittent asthma, uncomplicated: Secondary | ICD-10-CM | POA: Diagnosis not present

## 2020-09-09 DIAGNOSIS — D509 Iron deficiency anemia, unspecified: Secondary | ICD-10-CM | POA: Diagnosis not present

## 2020-09-20 ENCOUNTER — Telehealth: Payer: BC Managed Care – PPO | Admitting: Family Medicine

## 2020-09-20 DIAGNOSIS — D509 Iron deficiency anemia, unspecified: Secondary | ICD-10-CM | POA: Diagnosis not present

## 2020-09-23 DIAGNOSIS — L608 Other nail disorders: Secondary | ICD-10-CM | POA: Diagnosis not present

## 2020-09-23 DIAGNOSIS — L851 Acquired keratosis [keratoderma] palmaris et plantaris: Secondary | ICD-10-CM | POA: Diagnosis not present

## 2020-09-26 DIAGNOSIS — Z20822 Contact with and (suspected) exposure to covid-19: Secondary | ICD-10-CM | POA: Diagnosis not present

## 2020-09-28 DIAGNOSIS — Z9884 Bariatric surgery status: Secondary | ICD-10-CM | POA: Diagnosis not present

## 2020-09-28 DIAGNOSIS — E669 Obesity, unspecified: Secondary | ICD-10-CM | POA: Diagnosis not present

## 2020-09-28 DIAGNOSIS — I10 Essential (primary) hypertension: Secondary | ICD-10-CM | POA: Diagnosis not present

## 2020-09-28 DIAGNOSIS — Z96652 Presence of left artificial knee joint: Secondary | ICD-10-CM | POA: Diagnosis not present

## 2020-09-28 DIAGNOSIS — Z87891 Personal history of nicotine dependence: Secondary | ICD-10-CM | POA: Diagnosis not present

## 2020-09-28 DIAGNOSIS — G8918 Other acute postprocedural pain: Secondary | ICD-10-CM | POA: Diagnosis not present

## 2020-09-28 DIAGNOSIS — Z96642 Presence of left artificial hip joint: Secondary | ICD-10-CM | POA: Diagnosis not present

## 2020-09-28 DIAGNOSIS — M1612 Unilateral primary osteoarthritis, left hip: Secondary | ICD-10-CM | POA: Diagnosis not present

## 2020-09-28 DIAGNOSIS — I471 Supraventricular tachycardia: Secondary | ICD-10-CM | POA: Diagnosis not present

## 2020-09-28 DIAGNOSIS — Z7901 Long term (current) use of anticoagulants: Secondary | ICD-10-CM | POA: Diagnosis not present

## 2020-09-28 DIAGNOSIS — K219 Gastro-esophageal reflux disease without esophagitis: Secondary | ICD-10-CM | POA: Diagnosis not present

## 2020-09-28 DIAGNOSIS — J4520 Mild intermittent asthma, uncomplicated: Secondary | ICD-10-CM | POA: Diagnosis not present

## 2020-09-28 DIAGNOSIS — G8929 Other chronic pain: Secondary | ICD-10-CM | POA: Diagnosis not present

## 2020-09-28 DIAGNOSIS — Z6841 Body Mass Index (BMI) 40.0 and over, adult: Secondary | ICD-10-CM | POA: Diagnosis not present

## 2020-09-28 DIAGNOSIS — J45909 Unspecified asthma, uncomplicated: Secondary | ICD-10-CM | POA: Diagnosis not present

## 2020-09-28 DIAGNOSIS — M47817 Spondylosis without myelopathy or radiculopathy, lumbosacral region: Secondary | ICD-10-CM | POA: Diagnosis not present

## 2020-09-28 DIAGNOSIS — Z791 Long term (current) use of non-steroidal anti-inflammatories (NSAID): Secondary | ICD-10-CM | POA: Diagnosis not present

## 2020-09-28 DIAGNOSIS — Z79899 Other long term (current) drug therapy: Secondary | ICD-10-CM | POA: Diagnosis not present

## 2020-09-28 DIAGNOSIS — M160 Bilateral primary osteoarthritis of hip: Secondary | ICD-10-CM | POA: Diagnosis not present

## 2020-09-28 HISTORY — PX: TOTAL HIP ARTHROPLASTY: SHX124

## 2020-09-29 DIAGNOSIS — Z7901 Long term (current) use of anticoagulants: Secondary | ICD-10-CM | POA: Diagnosis not present

## 2020-09-29 DIAGNOSIS — E669 Obesity, unspecified: Secondary | ICD-10-CM | POA: Diagnosis not present

## 2020-09-29 DIAGNOSIS — G8929 Other chronic pain: Secondary | ICD-10-CM | POA: Diagnosis not present

## 2020-09-29 DIAGNOSIS — M160 Bilateral primary osteoarthritis of hip: Secondary | ICD-10-CM | POA: Diagnosis not present

## 2020-09-29 DIAGNOSIS — Z9884 Bariatric surgery status: Secondary | ICD-10-CM | POA: Diagnosis not present

## 2020-09-29 DIAGNOSIS — Z87891 Personal history of nicotine dependence: Secondary | ICD-10-CM | POA: Diagnosis not present

## 2020-09-29 DIAGNOSIS — Z96652 Presence of left artificial knee joint: Secondary | ICD-10-CM | POA: Diagnosis not present

## 2020-09-29 DIAGNOSIS — I10 Essential (primary) hypertension: Secondary | ICD-10-CM | POA: Diagnosis not present

## 2020-09-29 DIAGNOSIS — J4520 Mild intermittent asthma, uncomplicated: Secondary | ICD-10-CM | POA: Diagnosis not present

## 2020-09-29 DIAGNOSIS — M1612 Unilateral primary osteoarthritis, left hip: Secondary | ICD-10-CM | POA: Diagnosis not present

## 2020-09-29 DIAGNOSIS — I471 Supraventricular tachycardia: Secondary | ICD-10-CM | POA: Diagnosis not present

## 2020-09-29 DIAGNOSIS — K219 Gastro-esophageal reflux disease without esophagitis: Secondary | ICD-10-CM | POA: Diagnosis not present

## 2020-09-29 DIAGNOSIS — Z6841 Body Mass Index (BMI) 40.0 and over, adult: Secondary | ICD-10-CM | POA: Diagnosis not present

## 2020-09-29 DIAGNOSIS — Z96642 Presence of left artificial hip joint: Secondary | ICD-10-CM | POA: Diagnosis not present

## 2020-09-29 DIAGNOSIS — Z791 Long term (current) use of non-steroidal anti-inflammatories (NSAID): Secondary | ICD-10-CM | POA: Diagnosis not present

## 2020-09-29 DIAGNOSIS — Z79899 Other long term (current) drug therapy: Secondary | ICD-10-CM | POA: Diagnosis not present

## 2020-10-04 DIAGNOSIS — F419 Anxiety disorder, unspecified: Secondary | ICD-10-CM | POA: Diagnosis not present

## 2020-10-04 DIAGNOSIS — F331 Major depressive disorder, recurrent, moderate: Secondary | ICD-10-CM | POA: Diagnosis not present

## 2020-10-22 ENCOUNTER — Encounter: Payer: Self-pay | Admitting: Family Medicine

## 2020-10-25 NOTE — Telephone Encounter (Signed)
Looks like she is due for a chronic disease follow up, can this be virtual?

## 2020-11-09 DIAGNOSIS — Z20822 Contact with and (suspected) exposure to covid-19: Secondary | ICD-10-CM | POA: Diagnosis not present

## 2020-11-10 DIAGNOSIS — M1612 Unilateral primary osteoarthritis, left hip: Secondary | ICD-10-CM | POA: Diagnosis not present

## 2020-11-11 ENCOUNTER — Other Ambulatory Visit: Payer: Self-pay | Admitting: Family Medicine

## 2020-11-11 NOTE — Telephone Encounter (Signed)
Routing to provider  

## 2020-11-11 NOTE — Telephone Encounter (Signed)
Requested medication (s) are due for refill today: yes  Requested medication (s) are on the active medication list: yes  Last refill:  01/28/20  22g Nancy Sanchez  Future visit scheduled: yes  Notes to clinic: Off protocol    Requested Prescriptions  Pending Prescriptions Disp Refills   mupirocin ointment (BACTROBAN) 2 % [Pharmacy Med Name: MUPIROCIN 2% OINTMENT] 22 g 0    Sig: Place 1 application into the nose 2 (two) times daily.      Off-Protocol Failed - 11/11/2020 10:08 AM      Failed - Medication not assigned to a protocol, review manually.      Passed - Valid encounter within last 12 months    Recent Outpatient Visits           2 months ago Osteoarthritis, unspecified osteoarthritis type, unspecified site   [facility], Nancy P, DO   3 months ago Chronic migraine without aura without status migrainosus, not intractable   [facility], Nancy P, DO   4 months ago Essential hypertension   [facility] [place], Nancy P, DO   5 months ago Routine general medical examination at a health care facility   [facility], Nancy P, DO   6 months ago Contact dermatitis, unspecified contact dermatitis type, unspecified trigger   [facility] [name], NP       Future Appointments             In 1 month Johnson, Nancy Rud, DO [facility]

## 2020-11-11 NOTE — Telephone Encounter (Signed)
Requested Prescriptions  Pending Prescriptions Disp Refills  . rizatriptan (MAXALT) 10 MG tablet [Pharmacy Med Name: RIZATRIPTAN 10 MG TABLET] 10 tablet 0    Sig: Take 1 tablet (10 mg total) by mouth as needed for migraine. May repeat after 2 hours if not better. No more than 2 pills in 24 hours     Neurology:  Migraine Therapy - Triptan Passed - 11/11/2020  9:01 AM      Passed - Last BP in normal range    BP Readings from Last 1 Encounters:  08/19/20 110/75         Passed - Valid encounter within last 12 months    Recent Outpatient Visits          2 months ago Osteoarthritis, unspecified osteoarthritis type, unspecified site   [facility], Megan P, DO   3 months ago Chronic migraine without aura without status migrainosus, not intractable   [facility] [place], Megan P, DO   4 months ago Essential hypertension   [facility] [place], Megan P, DO   5 months ago Routine general medical examination at a health care facility   [facility], [place] P, DO   6 months ago Contact dermatitis, unspecified contact dermatitis type, unspecified trigger   [facility] [name], NP      Future Appointments            In 1 month Johnson, Oralia Rud, DO [facility], [facility]

## 2020-11-17 DIAGNOSIS — S46212D Strain of muscle, fascia and tendon of other parts of biceps, left arm, subsequent encounter: Secondary | ICD-10-CM | POA: Diagnosis not present

## 2020-11-17 DIAGNOSIS — M1612 Unilateral primary osteoarthritis, left hip: Secondary | ICD-10-CM | POA: Diagnosis not present

## 2020-11-17 DIAGNOSIS — M19012 Primary osteoarthritis, left shoulder: Secondary | ICD-10-CM | POA: Diagnosis not present

## 2020-11-17 DIAGNOSIS — M47816 Spondylosis without myelopathy or radiculopathy, lumbar region: Secondary | ICD-10-CM | POA: Diagnosis not present

## 2020-11-17 DIAGNOSIS — M7542 Impingement syndrome of left shoulder: Secondary | ICD-10-CM | POA: Diagnosis not present

## 2020-11-17 DIAGNOSIS — Z96642 Presence of left artificial hip joint: Secondary | ICD-10-CM | POA: Diagnosis not present

## 2020-11-17 DIAGNOSIS — S46012D Strain of muscle(s) and tendon(s) of the rotator cuff of left shoulder, subsequent encounter: Secondary | ICD-10-CM | POA: Diagnosis not present

## 2020-11-29 ENCOUNTER — Encounter: Payer: Self-pay | Admitting: Family Medicine

## 2020-11-30 ENCOUNTER — Ambulatory Visit: Payer: BC Managed Care – PPO | Admitting: Podiatry

## 2020-11-30 NOTE — Telephone Encounter (Signed)
Called pt advised how it would work as far as self pay. Pt verbalized understanding

## 2020-12-06 ENCOUNTER — Encounter: Payer: Self-pay | Admitting: Family Medicine

## 2020-12-06 DIAGNOSIS — Z021 Encounter for pre-employment examination: Secondary | ICD-10-CM

## 2020-12-06 NOTE — Telephone Encounter (Signed)
Orders for titers in. Please send where she needs them drawn. Thanks!

## 2020-12-09 ENCOUNTER — Other Ambulatory Visit: Payer: Self-pay

## 2020-12-09 ENCOUNTER — Ambulatory Visit (INDEPENDENT_AMBULATORY_CARE_PROVIDER_SITE_OTHER): Payer: Self-pay

## 2020-12-09 DIAGNOSIS — Z23 Encounter for immunization: Secondary | ICD-10-CM

## 2020-12-09 DIAGNOSIS — Z021 Encounter for pre-employment examination: Secondary | ICD-10-CM

## 2020-12-10 LAB — MEASLES/MUMPS/RUBELLA IMMUNITY
MUMPS ABS, IGG: 227 AU/mL (ref >10.9)
RUBEOLA AB, IGG: 53 AU/mL (ref >16.4)
Rubella Antibodies, IGG: 12.9 index (ref >0.99)

## 2020-12-10 LAB — HEPATITIS B SURFACE ANTIBODY, QUANTITATIVE: Hepatitis B Surf Ab Quant: 62.5 m[IU]/mL (ref >9.9)

## 2020-12-10 LAB — VARICELLA ZOSTER ABS, IGG/IGM
Varicella IgM: 1.1 index — ABNORMAL HIGH (ref 0.00–0.90)
Varicella zoster IgG: 3214 index (ref >165)

## 2020-12-13 ENCOUNTER — Other Ambulatory Visit: Payer: Self-pay | Admitting: Family Medicine

## 2020-12-20 ENCOUNTER — Ambulatory Visit (INDEPENDENT_AMBULATORY_CARE_PROVIDER_SITE_OTHER): Payer: Self-pay | Admitting: Family Medicine

## 2020-12-20 ENCOUNTER — Other Ambulatory Visit: Payer: Self-pay

## 2020-12-20 ENCOUNTER — Encounter: Payer: Self-pay | Admitting: Family Medicine

## 2020-12-20 VITALS — BP 124/79 | HR 72 | Temp 98.0°F | Wt 220.0 lb

## 2020-12-20 DIAGNOSIS — F330 Major depressive disorder, recurrent, mild: Secondary | ICD-10-CM

## 2020-12-20 DIAGNOSIS — G43709 Chronic migraine without aura, not intractable, without status migrainosus: Secondary | ICD-10-CM

## 2020-12-20 DIAGNOSIS — F419 Anxiety disorder, unspecified: Secondary | ICD-10-CM

## 2020-12-20 DIAGNOSIS — I10 Essential (primary) hypertension: Secondary | ICD-10-CM

## 2020-12-20 DIAGNOSIS — D508 Other iron deficiency anemias: Secondary | ICD-10-CM

## 2020-12-20 DIAGNOSIS — E559 Vitamin D deficiency, unspecified: Secondary | ICD-10-CM

## 2020-12-20 DIAGNOSIS — Z13220 Encounter for screening for lipoid disorders: Secondary | ICD-10-CM

## 2020-12-20 DIAGNOSIS — J45909 Unspecified asthma, uncomplicated: Secondary | ICD-10-CM

## 2020-12-20 DIAGNOSIS — I471 Supraventricular tachycardia, unspecified: Secondary | ICD-10-CM

## 2020-12-20 DIAGNOSIS — K219 Gastro-esophageal reflux disease without esophagitis: Secondary | ICD-10-CM

## 2020-12-20 LAB — BAYER DCA HB A1C WAIVED: HB A1C (BAYER DCA - WAIVED): 4.6 % (ref <7.0)

## 2020-12-20 MED ORDER — GABAPENTIN 300 MG PO CAPS
600.0000 mg | ORAL_CAPSULE | Freq: Two times a day (BID) | ORAL | 1 refills | Status: DC
Start: 2020-12-20 — End: 2021-02-11

## 2020-12-20 MED ORDER — OMEPRAZOLE 40 MG PO CPDR: 40.0000 mg | DELAYED_RELEASE_CAPSULE | Freq: Two times a day (BID) | ORAL | 1 refills | Status: DC

## 2020-12-20 MED ORDER — MONTELUKAST SODIUM 10 MG PO TABS: 10.0000 mg | ORAL_TABLET | Freq: Every day | ORAL | 1 refills | Status: DC

## 2020-12-20 MED ORDER — PROPRANOLOL HCL 40 MG PO TABS: 40.0000 mg | ORAL_TABLET | Freq: Two times a day (BID) | ORAL | 1 refills | Status: DC

## 2020-12-20 MED ORDER — CYCLOBENZAPRINE HCL 5 MG PO TABS: 5.0000 mg | ORAL_TABLET | Freq: Two times a day (BID) | ORAL | 1 refills | Status: DC | PRN

## 2020-12-20 MED ORDER — BUDESONIDE-FORMOTEROL FUMARATE 160-4.5 MCG/ACT IN AERO: 2.0000 | INHALATION_SPRAY | Freq: Two times a day (BID) | RESPIRATORY_TRACT | 3 refills | Status: DC

## 2020-12-20 MED ORDER — ALBUTEROL SULFATE HFA 108 (90 BASE) MCG/ACT IN AERS: INHALATION_SPRAY | RESPIRATORY_TRACT | 6 refills | Status: DC

## 2020-12-20 MED ORDER — RIZATRIPTAN BENZOATE 10 MG PO TABS: 10.0000 mg | ORAL_TABLET | ORAL | 0 refills | Status: DC | PRN

## 2020-12-20 MED ORDER — ALBUTEROL SULFATE (2.5 MG/3ML) 0.083% IN NEBU: 2.5000 mg | INHALATION_SOLUTION | Freq: Four times a day (QID) | RESPIRATORY_TRACT | 6 refills | Status: DC | PRN

## 2020-12-20 NOTE — Assessment & Plan Note (Signed)
Under good control on current regimen. Continue current regimen. Continue to monitor. Call with any concerns. Refills given. Labs drawn today.   

## 2020-12-20 NOTE — Assessment & Plan Note (Signed)
Following with psychiatry. Call with any concerns. Continue to monitor. Stable.

## 2020-12-20 NOTE — Assessment & Plan Note (Signed)
Under good control on propranolol. Call with any concerns. Continue to monitor.

## 2020-12-20 NOTE — Assessment & Plan Note (Signed)
Rechecking labs today. Await results. Treat as needed.  °

## 2020-12-20 NOTE — Assessment & Plan Note (Signed)
Following with psychiatry. Call with any concerns. Continue to monitor. Stable. 

## 2020-12-20 NOTE — Assessment & Plan Note (Signed)
Encouraged diet and exercise with goal of losing 1-2lbs per week. Call with any concerns.  

## 2020-12-20 NOTE — Progress Notes (Signed)
BP 124/79   Pulse 72   Temp 98 F (36.7 C)   Wt 220 lb (99.8 kg)   SpO2 98%   BMI 40.24 kg/m    Subjective:    Patient ID: Nancy Sanchez, female    DOB: 1971-10-20, 50 y.o.   MRN: [id number]  HPI: Nancy Sanchez is a 50 y.o. female  Chief Complaint  Patient presents with  . Hypertension  . Anxiety  . vitamin d   . morbid obesity   HYPERTENSION Hypertension status: controlled  Satisfied with current treatment? yes Duration of hypertension: chronic BP monitoring frequency:  not checking BP medication side effects:  no Medication compliance: excellent compliance Previous BP meds: propranolol Aspirin: no Recurrent headaches: yes Visual changes: no Palpitations: no Dyspnea: no Chest pain: no Lower extremity edema: no Dizzy/lightheaded: no  ANXIETY/DEPRESSION- following with psychiatry. Feeling well. Just had a grandson. Very happy about that Duration: chronic Status:controlled Anxious mood: yes  Excessive worrying: no Irritability: no  Sweating: no Nausea: no Palpitations:no Hyperventilation: no Panic attacks: no Agoraphobia: no  Obscessions/compulsions: no Depressed mood: yes Depression screen [facility] 2/9 12/20/2020 08/19/2020 06/14/2020 12/19/2019 10/08/2019  Decreased Interest 3 1 2 3 3   Down, Depressed, Hopeless 0 1 0 2 0  PHQ - 2 Score 3 2 2 5 3   Altered sleeping 0 1 1 3 2   Tired, decreased energy 3 2 2 3 2   Change in appetite 0 1 2 3 2   Feeling bad or failure about yourself  0 0 0 1 0  Trouble concentrating 3 3 2 3 3   Moving slowly or fidgety/restless 0 0 0 1 0  Suicidal thoughts 0 0 0 0 0  PHQ-9 Score 9 9 9 19 12   Difficult doing work/chores Not difficult at all Not difficult at all - - Not difficult at all  Some recent data might be hidden   Anhedonia: no Weight changes: no Insomnia: no   Hypersomnia: yes Fatigue/loss of energy: yes Feelings of worthlessness: no Feelings of guilt: no Impaired concentration/indecisiveness:  no Suicidal ideations: no  Crying spells: no Recent Stressors/Life Changes: no   Relationship problems: no   Family stress: no     Financial stress: no    Job stress: no    Recent death/loss: no  Relevant past medical, surgical, family and social history reviewed and updated as indicated. Interim medical history since our last visit reviewed. Allergies and medications reviewed and updated.  Review of Systems  Constitutional: Negative.   Respiratory: Negative.   Cardiovascular: Negative.   Gastrointestinal: Negative.   Musculoskeletal: Negative.   Neurological: Negative.   Psychiatric/Behavioral: Negative.     Per HPI unless specifically indicated above     Objective:    BP 124/79   Pulse 72   Temp 98 F (36.7 C)   Wt 220 lb (99.8 kg)   SpO2 98%   BMI 40.24 kg/m   Wt Readings from Last 3 Encounters:  12/20/20 220 lb (99.8 kg)  08/19/20 220 lb (99.8 kg)  07/19/20 213 lb (96.6 kg)    Physical Exam Vitals and nursing note reviewed.  Constitutional:      General: She is not in acute distress.    Appearance: Normal appearance. She is not ill-appearing, toxic-appearing or diaphoretic.  HENT:     Head: Normocephalic and atraumatic.     Right Ear: External ear normal.     Left Ear: External ear normal.     Nose: Nose normal.  Mouth/Throat:     Mouth: Mucous membranes are moist.     Pharynx: Oropharynx is clear.  Eyes:     General: No scleral icterus.       Right eye: No discharge.        Left eye: No discharge.     Extraocular Movements: Extraocular movements intact.     Conjunctiva/sclera: Conjunctivae normal.     Pupils: Pupils are equal, round, and reactive to light.  Cardiovascular:     Rate and Rhythm: Normal rate and regular rhythm.     Pulses: Normal pulses.     Heart sounds: Normal heart sounds. No murmur heard. No friction rub. No gallop.   Pulmonary:     Effort: Pulmonary effort is normal. No respiratory distress.     Breath sounds: Normal  breath sounds. No stridor. No wheezing, rhonchi or rales.  Chest:     Chest wall: No tenderness.  Musculoskeletal:        General: Normal range of motion.     Cervical back: Normal range of motion and neck supple.  Skin:    General: Skin is warm and dry.     Capillary Refill: Capillary refill takes less than 2 seconds.     Coloration: Skin is not jaundiced or pale.     Findings: No bruising, erythema, lesion or rash.  Neurological:     General: No focal deficit present.     Mental Status: She is alert and oriented to person, place, and time. Mental status is at baseline.  Psychiatric:        Mood and Affect: Mood normal.        Behavior: Behavior normal.        Thought Content: Thought content normal.        Judgment: Judgment normal.     Results for orders placed or performed in visit on 12/20/20  Bayer DCA Hb A1c Waived  Result Value Ref Range   HB A1C (BAYER DCA - WAIVED) 4.6 <7.0 %      Assessment & Plan:   Problem List Items Addressed This Visit      Cardiovascular and Mediastinum   Paroxysmal supraventricular tachycardia (HCC)    Under good control on propranolol. Call with any concerns. Continue to monitor.       Relevant Medications   propranolol (INDERAL) 40 MG tablet   Other Relevant Orders   CBC with Differential/Platelet   Comprehensive metabolic panel   Hypertension - Primary    Under good control on current regimen. Continue current regimen. Continue to monitor. Call with any concerns. Refills given. Labs drawn today.        Relevant Medications   propranolol (INDERAL) 40 MG tablet   Other Relevant Orders   CBC with Differential/Platelet   Comprehensive metabolic panel   Migraines    Under good control on current regimen. Continue current regimen. Continue to monitor. Call with any concerns. Refills given. Labs drawn today.        Relevant Medications   escitalopram (LEXAPRO) 20 MG tablet   rizatriptan (MAXALT) 10 MG tablet   propranolol  (INDERAL) 40 MG tablet   gabapentin (NEURONTIN) 300 MG capsule   cyclobenzaprine (FLEXERIL) 5 MG tablet   buPROPion (WELLBUTRIN XL) 300 MG 24 hr tablet     Respiratory   Asthma    Under good control on current regimen. Continue current regimen. Continue to monitor. Call with any concerns. Refills given. Labs drawn today.  Relevant Medications   montelukast (SINGULAIR) 10 MG tablet   budesonide-formoterol (SYMBICORT) 160-4.5 MCG/ACT inhaler   albuterol (VENTOLIN HFA) 108 (90 Base) MCG/ACT inhaler   albuterol (PROVENTIL) (2.5 MG/3ML) 0.083% nebulizer solution     Digestive   GERD (gastroesophageal reflux disease)   Relevant Medications   omeprazole (PRILOSEC) 40 MG capsule   Other Relevant Orders   CBC with Differential/Platelet   Comprehensive metabolic panel     Other   Iron deficiency anemia    Rechecking labs today. Await results. Treat as needed.       Relevant Orders   CBC with Differential/Platelet   Comprehensive metabolic panel   Iron and TIBC   Ferritin   Anxiety    Following with psychiatry. Call with any concerns. Continue to monitor. Stable.      Relevant Medications   escitalopram (LEXAPRO) 20 MG tablet   busPIRone (BUSPAR) 5 MG tablet   buPROPion (WELLBUTRIN XL) 300 MG 24 hr tablet   Other Relevant Orders   CBC with Differential/Platelet   Comprehensive metabolic panel   Vitamin D deficiency    Rechecking labs today. Await results. Treat as needed.       Relevant Orders   CBC with Differential/Platelet   Comprehensive metabolic panel   VITAMIN D 25 Hydroxy (Vit-D Deficiency, Fractures)   Mild episode of recurrent major depressive disorder [facility])    Following with psychiatry. Call with any concerns. Continue to monitor. Stable.      Relevant Medications   escitalopram (LEXAPRO) 20 MG tablet   busPIRone (BUSPAR) 5 MG tablet   buPROPion (WELLBUTRIN XL) 300 MG 24 hr tablet   Morbid obesity (HCC)    Encouraged diet and exercise with goal of  losing 1-2lbs per week. Call with any concerns.       Relevant Orders   Bayer DCA Hb A1c Waived (Completed)   CBC with Differential/Platelet   Comprehensive metabolic panel    Other Visit Diagnoses    Screening for cholesterol level       Labs drawn today. Await results.    Relevant Orders   Lipid Panel w/o Chol/HDL Ratio       Follow up plan: Return in about 6 months (around 06/19/2021) for physical.

## 2020-12-21 LAB — CBC WITH DIFFERENTIAL/PLATELET
Basophils Absolute: 0 10*3/uL (ref 0.0–0.2)
Basos: 1 %
EOS (ABSOLUTE): 0.2 10*3/uL (ref 0.0–0.4)
Eos: 3 %
Hematocrit: 42.3 % (ref 34.0–46.6)
Hemoglobin: 13.2 g/dL (ref 11.1–15.9)
Immature Grans (Abs): 0 10*3/uL (ref 0.0–0.1)
Immature Granulocytes: 0 %
Lymphocytes Absolute: 1.4 10*3/uL (ref 0.7–3.1)
Lymphs: 24 %
MCH: 28.6 pg (ref 26.6–33.0)
MCHC: 31.2 g/dL — ABNORMAL LOW (ref 31.5–35.7)
MCV: 92 fL (ref 79–97)
Monocytes Absolute: 0.5 10*3/uL (ref 0.1–0.9)
Monocytes: 9 %
Neutrophils Absolute: 3.7 10*3/uL (ref 1.4–7.0)
Neutrophils: 63 %
Platelets: 306 10*3/uL (ref 150–450)
RBC: 4.62 x10E6/uL (ref 3.77–5.28)
RDW: 13.5 % (ref 11.7–15.4)
WBC: 5.9 10*3/uL (ref 3.4–10.8)

## 2020-12-21 LAB — COMPREHENSIVE METABOLIC PANEL
ALT: 20 IU/L (ref 0–32)
AST: 21 IU/L (ref 0–40)
Albumin/Globulin Ratio: 2 (ref 1.2–2.2)
Albumin: 4.5 g/dL (ref 3.8–4.8)
Alkaline Phosphatase: 83 IU/L (ref 44–121)
BUN/Creatinine Ratio: 32 — ABNORMAL HIGH (ref 9–23)
BUN: 20 mg/dL (ref 6–24)
Bilirubin Total: 0.2 mg/dL (ref 0.0–1.2)
CO2: 24 mmol/L (ref 20–29)
Calcium: 9.9 mg/dL (ref 8.7–10.2)
Chloride: 101 mmol/L (ref 96–106)
Creatinine, Ser: 0.63 mg/dL (ref 0.57–1.00)
Globulin, Total: 2.2 g/dL (ref 1.5–4.5)
Glucose: 77 mg/dL (ref 65–99)
Potassium: 5.5 mmol/L — ABNORMAL HIGH (ref 3.5–5.2)
Sodium: 140 mmol/L (ref 134–144)
Total Protein: 6.7 g/dL (ref 6.0–8.5)
eGFR: 109 mL/min/{1.73_m2} (ref >59)

## 2020-12-21 LAB — LIPID PANEL W/O CHOL/HDL RATIO
Cholesterol, Total: 207 mg/dL — ABNORMAL HIGH (ref 100–199)
HDL: 62 mg/dL (ref >39)
LDL Chol Calc (NIH): 120 mg/dL — ABNORMAL HIGH (ref 0–99)
Triglycerides: 140 mg/dL (ref 0–149)
VLDL Cholesterol Cal: 25 mg/dL (ref 5–40)

## 2020-12-21 LAB — VITAMIN D 25 HYDROXY (VIT D DEFICIENCY, FRACTURES): Vit D, 25-Hydroxy: 25.3 ng/mL — ABNORMAL LOW (ref 30.0–100.0)

## 2020-12-21 LAB — IRON AND TIBC
Iron Saturation: 32 % (ref 15–55)
Iron: 104 ug/dL (ref 27–159)
Total Iron Binding Capacity: 327 ug/dL (ref 250–450)
UIBC: 223 ug/dL (ref 131–425)

## 2020-12-21 LAB — FERRITIN: Ferritin: 95 ng/mL (ref 15–150)

## 2021-01-12 ENCOUNTER — Encounter: Payer: Self-pay | Admitting: Family Medicine

## 2021-01-24 DIAGNOSIS — M79676 Pain in unspecified toe(s): Secondary | ICD-10-CM

## 2021-01-25 ENCOUNTER — Encounter: Payer: Self-pay | Admitting: Family Medicine

## 2021-02-08 ENCOUNTER — Encounter: Payer: Self-pay | Admitting: Family Medicine

## 2021-02-11 MED ORDER — PROPRANOLOL HCL 40 MG PO TABS: 40.0000 mg | ORAL_TABLET | Freq: Two times a day (BID) | ORAL | 1 refills | Status: DC

## 2021-02-11 MED ORDER — GABAPENTIN 300 MG PO CAPS: 600.0000 mg | ORAL_CAPSULE | Freq: Two times a day (BID) | ORAL | 1 refills | Status: DC

## 2021-02-11 MED ORDER — ONDANSETRON 4 MG PO TBDP: 4.0000 mg | ORAL_TABLET | Freq: Three times a day (TID) | ORAL | 0 refills | Status: DC | PRN

## 2021-02-11 MED ORDER — RIZATRIPTAN BENZOATE 10 MG PO TABS: 10.0000 mg | ORAL_TABLET | ORAL | 0 refills | Status: DC | PRN

## 2021-02-11 MED ORDER — MONTELUKAST SODIUM 10 MG PO TABS: 10.0000 mg | ORAL_TABLET | Freq: Every day | ORAL | 1 refills | Status: DC

## 2021-02-11 MED ORDER — BUDESONIDE-FORMOTEROL FUMARATE 160-4.5 MCG/ACT IN AERO: 2.0000 | INHALATION_SPRAY | Freq: Two times a day (BID) | RESPIRATORY_TRACT | 3 refills | Status: DC

## 2021-02-11 MED ORDER — ALBUTEROL SULFATE HFA 108 (90 BASE) MCG/ACT IN AERS: INHALATION_SPRAY | RESPIRATORY_TRACT | 6 refills | Status: DC

## 2021-02-11 MED ORDER — FEXOFENADINE HCL 180 MG PO TABS: 180.0000 mg | ORAL_TABLET | Freq: Every day | ORAL | 3 refills | Status: DC

## 2021-02-11 MED ORDER — DICLOFENAC SODIUM 1 % EX GEL: 4.0000 g | Freq: Four times a day (QID) | CUTANEOUS | 12 refills | Status: DC

## 2021-02-11 MED ORDER — OMEPRAZOLE 40 MG PO CPDR: 40.0000 mg | DELAYED_RELEASE_CAPSULE | Freq: Two times a day (BID) | ORAL | 1 refills | Status: DC

## 2021-02-11 MED ORDER — CYCLOBENZAPRINE HCL 5 MG PO TABS: 5.0000 mg | ORAL_TABLET | Freq: Two times a day (BID) | ORAL | 1 refills | Status: DC | PRN

## 2021-02-11 MED ORDER — ALBUTEROL SULFATE (2.5 MG/3ML) 0.083% IN NEBU: 2.5000 mg | INHALATION_SOLUTION | Freq: Four times a day (QID) | RESPIRATORY_TRACT | 6 refills | Status: DC | PRN

## 2021-02-11 NOTE — Telephone Encounter (Signed)
Rx's sent to pharmacy.  

## 2021-02-16 ENCOUNTER — Encounter: Payer: Self-pay | Admitting: Family Medicine

## 2021-02-17 ENCOUNTER — Ambulatory Visit: Payer: BC Managed Care – PPO | Admitting: Family Medicine

## 2021-03-14 ENCOUNTER — Encounter: Payer: Self-pay | Admitting: Family Medicine

## 2021-03-14 ENCOUNTER — Other Ambulatory Visit: Payer: Self-pay

## 2021-03-14 MED ORDER — ONDANSETRON 4 MG PO TBDP: 4.0000 mg | ORAL_TABLET | Freq: Three times a day (TID) | ORAL | 0 refills | Status: DC | PRN

## 2021-04-05 ENCOUNTER — Telehealth: Payer: Self-pay

## 2021-04-05 NOTE — Telephone Encounter (Signed)
Pt faxed for medical records request stated to deliver to her but there would be a fee ask if there is a place she would like records to in particular ask if they can send a request or if she can come sign new request .If pt calls back please left front office know.

## 2021-04-07 DIAGNOSIS — Z20822 Contact with and (suspected) exposure to covid-19: Secondary | ICD-10-CM | POA: Diagnosis not present

## 2021-04-14 ENCOUNTER — Other Ambulatory Visit: Payer: Self-pay | Admitting: Family Medicine

## 2021-04-14 NOTE — Telephone Encounter (Signed)
Emailed form to pt per her request

## 2021-04-14 NOTE — Telephone Encounter (Signed)
Pt wanted to know if she could have the records printed out so she could come collect them from the office, please advise.

## 2021-04-25 ENCOUNTER — Encounter: Payer: Self-pay | Admitting: Family Medicine

## 2021-04-29 MED ORDER — NYSTATIN 100000 UNIT/GM EX POWD: Freq: Three times a day (TID) | CUTANEOUS | 5 refills | Status: DC

## 2021-04-29 MED ORDER — NYSTATIN 100000 UNIT/GM EX CREA: 1.0000 "application " | TOPICAL_CREAM | Freq: Two times a day (BID) | CUTANEOUS | 5 refills | Status: DC

## 2021-05-05 ENCOUNTER — Other Ambulatory Visit: Payer: Self-pay | Admitting: Family Medicine

## 2021-05-05 DIAGNOSIS — Z8742 Personal history of other diseases of the female genital tract: Secondary | ICD-10-CM | POA: Diagnosis not present

## 2021-05-12 ENCOUNTER — Encounter: Payer: Self-pay | Admitting: Family Medicine

## 2021-05-17 ENCOUNTER — Telehealth: Payer: Medicaid Other | Admitting: Nurse Practitioner

## 2021-05-20 ENCOUNTER — Ambulatory Visit: Payer: Medicaid Other | Admitting: Family Medicine

## 2021-06-02 DIAGNOSIS — Z20828 Contact with and (suspected) exposure to other viral communicable diseases: Secondary | ICD-10-CM | POA: Diagnosis not present

## 2021-06-04 ENCOUNTER — Other Ambulatory Visit: Payer: Self-pay | Admitting: Family Medicine

## 2021-06-04 NOTE — Telephone Encounter (Signed)
Requested Prescriptions  Pending Prescriptions Disp Refills  . rizatriptan (MAXALT) 10 MG tablet [Pharmacy Med Name: RIZATRIPTAN 10 MG TABLET] 8 tablet 0    Sig: TAKE ONE TABLET BY MOUTH AT ONSET OF HEADACHE; MAY REPEAT ONE TABLET IN 2 HOURS IF NEEDED.     Neurology:  Migraine Therapy - Triptan Passed - 06/04/2021  5:32 PM      Passed - Last BP in normal range    BP Readings from Last 1 Encounters:  12/20/20 124/79         Passed - Valid encounter within last 12 months    Recent Outpatient Visits          5 months ago Primary hypertension   [facility] [place], Megan P, DO   9 months ago Osteoarthritis, unspecified osteoarthritis type, unspecified site   [facility], Megan P, DO   10 months ago Chronic migraine without aura without status migrainosus, not intractable   [facility], Megan P, DO   11 months ago Essential hypertension   [facility] [place], Megan P, DO   11 months ago Routine general medical examination at a health care facility   [facility] [place], [place], DO      Future Appointments            In 2 weeks Laural Benes, Oralia Rud, DO [facility]

## 2021-06-17 DIAGNOSIS — Z20828 Contact with and (suspected) exposure to other viral communicable diseases: Secondary | ICD-10-CM | POA: Diagnosis not present

## 2021-06-20 ENCOUNTER — Other Ambulatory Visit (HOSPITAL_COMMUNITY)
Admission: RE | Admit: 2021-06-20 | Discharge: 2021-06-20 | Disposition: A | Discharge status: Home / Self Care | Payer: BC Managed Care – PPO | Source: Ambulatory Visit | Attending: Family Medicine | Admitting: Family Medicine

## 2021-06-20 ENCOUNTER — Encounter: Payer: Self-pay | Admitting: Family Medicine

## 2021-06-20 ENCOUNTER — Other Ambulatory Visit: Payer: Self-pay

## 2021-06-20 ENCOUNTER — Ambulatory Visit (INDEPENDENT_AMBULATORY_CARE_PROVIDER_SITE_OTHER): Payer: BC Managed Care – PPO | Admitting: Family Medicine

## 2021-06-20 VITALS — BP 133/85 | HR 77 | Temp 98.5°F | Ht 62.01 in | Wt 221.0 lb

## 2021-06-20 DIAGNOSIS — Z0000 Encounter for general adult medical examination without abnormal findings: Secondary | ICD-10-CM

## 2021-06-20 DIAGNOSIS — I471 Supraventricular tachycardia, unspecified: Secondary | ICD-10-CM

## 2021-06-20 DIAGNOSIS — Z23 Encounter for immunization: Secondary | ICD-10-CM | POA: Diagnosis not present

## 2021-06-20 DIAGNOSIS — K219 Gastro-esophageal reflux disease without esophagitis: Secondary | ICD-10-CM

## 2021-06-20 DIAGNOSIS — Z13220 Encounter for screening for lipoid disorders: Secondary | ICD-10-CM | POA: Diagnosis not present

## 2021-06-20 DIAGNOSIS — Z01419 Encounter for gynecological examination (general) (routine) without abnormal findings: Secondary | ICD-10-CM | POA: Diagnosis present

## 2021-06-20 DIAGNOSIS — Z124 Encounter for screening for malignant neoplasm of cervix: Secondary | ICD-10-CM | POA: Insufficient documentation

## 2021-06-20 DIAGNOSIS — E559 Vitamin D deficiency, unspecified: Secondary | ICD-10-CM | POA: Diagnosis not present

## 2021-06-20 DIAGNOSIS — I10 Essential (primary) hypertension: Secondary | ICD-10-CM

## 2021-06-20 DIAGNOSIS — D508 Other iron deficiency anemias: Secondary | ICD-10-CM | POA: Diagnosis not present

## 2021-06-20 DIAGNOSIS — Z1211 Encounter for screening for malignant neoplasm of colon: Secondary | ICD-10-CM

## 2021-06-20 DIAGNOSIS — F330 Major depressive disorder, recurrent, mild: Secondary | ICD-10-CM

## 2021-06-20 DIAGNOSIS — G43709 Chronic migraine without aura, not intractable, without status migrainosus: Secondary | ICD-10-CM

## 2021-06-20 DIAGNOSIS — J45909 Unspecified asthma, uncomplicated: Secondary | ICD-10-CM | POA: Diagnosis not present

## 2021-06-20 DIAGNOSIS — F419 Anxiety disorder, unspecified: Secondary | ICD-10-CM

## 2021-06-20 DIAGNOSIS — Z113 Encounter for screening for infections with a predominantly sexual mode of transmission: Secondary | ICD-10-CM

## 2021-06-20 DIAGNOSIS — Z1231 Encounter for screening mammogram for malignant neoplasm of breast: Secondary | ICD-10-CM

## 2021-06-20 DIAGNOSIS — Z9884 Bariatric surgery status: Secondary | ICD-10-CM

## 2021-06-20 LAB — URINALYSIS, ROUTINE W REFLEX MICROSCOPIC
Bilirubin, UA: NEGATIVE
Glucose, UA: NEGATIVE
Leukocytes,UA: NEGATIVE
Nitrite, UA: NEGATIVE
Protein,UA: NEGATIVE
RBC, UA: NEGATIVE
Specific Gravity, UA: 1.025 (ref 1.005–1.030)
Urobilinogen, Ur: 0.2 mg/dL (ref 0.2–1.0)
pH, UA: 5.5 (ref 5.0–7.5)

## 2021-06-20 LAB — MICROALBUMIN, URINE WAIVED
Creatinine, Urine Waived: 300 mg/dL (ref 10–300)
Microalb, Ur Waived: 10 mg/L (ref 0–19)
Microalb/Creat Ratio: <30 mg/g (ref <30)

## 2021-06-20 MED ORDER — ONDANSETRON 4 MG PO TBDP: 4.0000 mg | ORAL_TABLET | Freq: Three times a day (TID) | ORAL | 0 refills | Status: DC | PRN

## 2021-06-20 MED ORDER — PROPRANOLOL HCL 40 MG PO TABS: 40.0000 mg | ORAL_TABLET | Freq: Two times a day (BID) | ORAL | 1 refills | Status: DC

## 2021-06-20 MED ORDER — ALBUTEROL SULFATE (2.5 MG/3ML) 0.083% IN NEBU: 2.5000 mg | INHALATION_SOLUTION | Freq: Four times a day (QID) | RESPIRATORY_TRACT | 6 refills | Status: DC | PRN

## 2021-06-20 MED ORDER — ALBUTEROL SULFATE HFA 108 (90 BASE) MCG/ACT IN AERS: INHALATION_SPRAY | RESPIRATORY_TRACT | 6 refills | Status: DC

## 2021-06-20 MED ORDER — RIZATRIPTAN BENZOATE 10 MG PO TABS: ORAL_TABLET | ORAL | 12 refills | Status: DC

## 2021-06-20 MED ORDER — GABAPENTIN 300 MG PO CAPS: 600.0000 mg | ORAL_CAPSULE | Freq: Two times a day (BID) | ORAL | 1 refills | Status: DC

## 2021-06-20 MED ORDER — CYCLOBENZAPRINE HCL 5 MG PO TABS: 5.0000 mg | ORAL_TABLET | Freq: Two times a day (BID) | ORAL | 1 refills | Status: DC | PRN

## 2021-06-20 MED ORDER — MONTELUKAST SODIUM 10 MG PO TABS: 10.0000 mg | ORAL_TABLET | Freq: Every day | ORAL | 1 refills | Status: DC

## 2021-06-20 MED ORDER — OMEPRAZOLE 40 MG PO CPDR: 40.0000 mg | DELAYED_RELEASE_CAPSULE | Freq: Two times a day (BID) | ORAL | 1 refills | Status: DC

## 2021-06-20 NOTE — Assessment & Plan Note (Signed)
Under good control on current regimen. Continue current regimen. Continue to monitor. Call with any concerns. Refills given. Labs drawn today.   

## 2021-06-20 NOTE — Assessment & Plan Note (Signed)
Stable. Continue to follow with psychiatry. Call with any concerns.  

## 2021-06-20 NOTE — Progress Notes (Signed)
BP 133/85   Pulse 77   Temp 98.5 F (36.9 C) (Oral)   Ht 5' 2.01" (1.575 m)   Wt 221 lb (100.2 kg)   SpO2 98%   BMI 40.41 kg/m    Subjective:    Patient ID: Nancy Sanchez, female    DOB: 20-Apr-1971, 50 y.o.   MRN: [id number]  HPI: Nancy Sanchez is a 50 y.o. female presenting on 06/20/2021 for comprehensive medical examination. Current medical complaints include:  HYPERTENSION Hypertension status: controlled  Satisfied with current treatment? yes Duration of hypertension: chronic BP monitoring frequency:  not checking BP medication side effects:  no Medication compliance: excellent compliance Previous BP meds:propranolol Aspirin: no Recurrent headaches: no Visual changes: no Palpitations: no Dyspnea: no Chest pain: no Lower extremity edema: no Dizzy/lightheaded: no  ANXIETY/DEPRESSION Duration: chronic Status:controlled Anxious mood: yes  Excessive worrying: yes Irritability: no  Sweating: no Nausea: no Palpitations:no Hyperventilation: no Panic attacks: no Agoraphobia: no  Obscessions/compulsions: no Depressed mood: yes Depression screen [facility] 2/9 06/20/2021 12/20/2020 08/19/2020 06/14/2020 12/19/2019  Decreased Interest 2 3 1 2 3   Down, Depressed, Hopeless 1 0 1 0 2  PHQ - 2 Score 3 3 2 2 5   Altered sleeping 2 0 1 1 3   Tired, decreased energy 3 3 2 2 3   Change in appetite 2 0 1 2 3   Feeling bad or failure about yourself  0 0 0 0 1  Trouble concentrating 3 3 3 2 3   Moving slowly or fidgety/restless 0 0 0 0 1  Suicidal thoughts 0 0 0 0 0  PHQ-9 Score 13 9 9 9 19   Difficult doing work/chores Somewhat difficult Not difficult at all Not difficult at all - -  Some recent data might be hidden   Anhedonia: no Weight changes: yes Insomnia: no   Hypersomnia: yes Fatigue/loss of energy: yes Feelings of worthlessness: no Feelings of guilt: no Impaired concentration/indecisiveness: no Suicidal ideations: no  Crying spells: no Recent Stressors/Life  Changes: no   Relationship problems: no   Family stress: no     Financial stress: no    Job stress: no    Recent death/loss: no  ANEMIA Anemia status: stable Etiology of anemia: iron def Duration of anemia treatment: chronic  Compliance with treatment: excellent compliance Iron supplementation side effects: no Severity of anemia: moderate Fatigue: yes Decreased exercise tolerance: no  Dyspnea on exertion: no Palpitations: no Bleeding: no Pica: no  She currently lives with: alone Menopausal Symptoms: no  Depression Screen done today and results listed below:  Depression screen [facility] 2/9 06/20/2021 12/20/2020 08/19/2020 06/14/2020 12/19/2019  Decreased Interest 2 3 1 2 3   Down, Depressed, Hopeless 1 0 1 0 2  PHQ - 2 Score 3 3 2 2 5   Altered sleeping 2 0 1 1 3   Tired, decreased energy 3 3 2 2 3   Change in appetite 2 0 1 2 3   Feeling bad or failure about yourself  0 0 0 0 1  Trouble concentrating 3 3 3 2 3   Moving slowly or fidgety/restless 0 0 0 0 1  Suicidal thoughts 0 0 0 0 0  PHQ-9 Score 13 9 9 9 19   Difficult doing work/chores Somewhat difficult Not difficult at all Not difficult at all - -  Some recent data might be hidden    Past Medical History:  Past Medical History:  Diagnosis Date   Abscess of abdominal wall 11/06/2017   Abscess of right foot 07/18/2017   ADHD  Allergy    Anemia    Anxiety    Arthritis    Asthma    Barrett's esophagus    Bulging lumbar disc    L5-S1   Depression    Endometriosis    GERD (gastroesophageal reflux disease)    Hernia, hiatal    DDD   HPV in female    Hypertension    Infection of skin due to methicillin resistant Staphylococcus aureus (MRSA) 11/09/2017   Migraine    Osteoporosis     Surgical History:  Past Surgical History:  Procedure Laterality Date   ARTHROSCOPIC REPAIR ACL Left    CARPAL TUNNEL RELEASE Bilateral    ESSURE TUBAL LIGATION     GASTRIC BYPASS     HEEL SPUR EXCISION Left    INGUINAL HERNIA REPAIR   12/18/2017   JOINT REPLACEMENT Left    knee   SHOULDER ARTHROSCOPY W/ LABRAL REPAIR  03/2020   TONSILLECTOMY     TOTAL HIP ARTHROPLASTY  09/28/2020    Medications:  Current Outpatient Medications on File Prior to Visit  Medication Sig   acetaminophen (TYLENOL) 650 MG CR tablet Take by mouth.   ascorbic acid (VITAMIN C) 500 MG tablet Take by mouth.   Biotin 1 MG CAPS Take by mouth.   budesonide-formoterol (SYMBICORT) 160-4.5 MCG/ACT inhaler Inhale 2 puffs into the lungs 2 (two) times daily.   buPROPion (WELLBUTRIN XL) 150 MG 24 hr tablet Take 150 mg by mouth every morning.   buPROPion (WELLBUTRIN XL) 300 MG 24 hr tablet Take 1 tablet (300 mg total) by mouth daily.   busPIRone (BUSPAR) 5 MG tablet Take 1 tablet (5 mg total) by mouth as needed (Take twice a day as needed for increased anxiety.).   calcium carbonate (OSCAL) 1500 (600 Ca) MG TABS tablet Take by mouth daily.    Calcium-Magnesium-Zinc 333-133-5 MG TABS Take 1 tablet by mouth daily.   diclofenac Sodium (VOLTAREN) 1 % GEL Apply 4 g topically 4 (four) times daily.   EMGALITY 120 MG/ML SOAJ Inject 1 mL into the skin every 30 (thirty) days.   escitalopram (LEXAPRO) 20 MG tablet Take 1 tablet (20 mg total) by mouth daily.   ferrous sulfate 324 (65 Fe) MG TBEC Take by mouth daily.   fexofenadine (ALLEGRA) 180 MG tablet Take 1 tablet (180 mg total) by mouth daily.   fluticasone (CUTIVATE) 0.005 % ointment Apply topically as needed.    fluticasone (FLONASE) 50 MCG/ACT nasal spray Place into the nose as needed.    magnesium oxide (MAG-OX) 400 MG tablet Take 400 mg by mouth 2 (two) times daily.   meclizine (ANTIVERT) 25 MG tablet Take by mouth as needed.    Misc Natural Products (IN-FLA-MEND) CAPS Take by mouth.   mupirocin ointment (BACTROBAN) 2 % Place 1 application into the nose 2 (two) times daily.   nystatin (MYCOSTATIN/NYSTOP) powder Apply topically 3 (three) times daily.   nystatin cream (MYCOSTATIN) Apply 1 application  topically 2 (two) times daily.   Omega-3 1000 MG CAPS Take by mouth.   QUEtiapine (SEROQUEL) 25 MG tablet Take 25 mg by mouth at bedtime.   QUEtiapine (SEROQUEL) 50 MG tablet Take 1 tablet (50 mg total) by mouth at bedtime.   silver sulfADIAZINE (SILVADENE) 1 % cream Apply topically.   TURMERIC PO Take by mouth daily.    UNABLE TO FIND Take by mouth daily. Part cherry capsule   No current facility-administered medications on file prior to visit.    Allergies:  Allergies  Allergen Reactions   Metronidazole Hives, Itching and Rash   Oxycodone-Acetaminophen Nausea And Vomiting    Other reaction(s): Nausea And Vomiting nausea nausea   Prednisone Anxiety    Other reaction(s): Other (See Comments) "agitated"   Tape Rash   Aspirin     Unable to take aspirin products since RNY Gastric Bypass   Codeine     Other reaction(s): Other (See Comments)   Nickel Itching    Itching from earrings -unsure of composition   Nsaids Other (See Comments)    Gastric bypass   Other Itching    Itching from earrings -unsure of composition   Oxycodone Other (See Comments)    Other reaction(s): Vomiting   Fluticasone-Salmeterol Anxiety    Other Reaction: jittery Other Reaction: jittery Other Reaction: jittery Other Reaction: jittery Other Reaction: jittery   Latex Rash    Other Reaction: Rash on hands only Other Reaction: Rash on hands only   Morphine Anxiety and Itching    Does well with dilaudid and toradol Does well with dilaudid and toradol    Social History:  Social History   Socioeconomic History   Marital status: Single    Spouse name: Not on file   Number of children: Not on file   Years of education: Not on file   Highest education level: Not on file  Occupational History   Not on file  Tobacco Use   Smoking status: Former    Types: Cigarettes    Quit date: 10/23/1998    Years since quitting: 22.6   Smokeless tobacco: Never  Vaping Use   Vaping Use: Never used  Substance  and Sexual Activity   Alcohol use: No   Drug use: No   Sexual activity: Yes    Birth control/protection: Surgical  Other Topics Concern   Not on file  Social History Narrative   Not on file   Social Determinants of Health   Financial Resource Strain: Not on file  Food Insecurity: Not on file  Transportation Needs: Not on file  Physical Activity: Not on file  Stress: Not on file  Social Connections: Not on file  Intimate Partner Violence: Not on file   Social History   Tobacco Use  Smoking Status Former   Types: Cigarettes   Quit date: 10/23/1998   Years since quitting: 22.6  Smokeless Tobacco Never   Social History   Substance and Sexual Activity  Alcohol Use No    Family History:  Family History  Adopted: Yes  Problem Relation Age of Onset   Heart disease Father    Breast cancer Neg Hx     Past medical history, surgical history, medications, allergies, family history and social history reviewed with patient today and changes made to appropriate areas of the chart.   Review of Systems  Constitutional: Negative.   HENT: Negative.    Eyes: Negative.   Respiratory: Negative.    Cardiovascular:  Positive for palpitations. Negative for chest pain, orthopnea, claudication, leg swelling and PND.  Gastrointestinal:  Positive for constipation and heartburn. Negative for abdominal pain, blood in stool, diarrhea, melena, nausea and vomiting.  Genitourinary: Negative.   Musculoskeletal:  Positive for joint pain and myalgias. Negative for back pain, falls and neck pain.  Skin: Negative.   Neurological: Negative.   Endo/Heme/Allergies: Negative.   Psychiatric/Behavioral: Negative.    All other ROS negative except what is listed above and in the HPI.      Objective:    BP 133/85  Pulse 77   Temp 98.5 F (36.9 C) (Oral)   Ht 5' 2.01" (1.575 m)   Wt 221 lb (100.2 kg)   SpO2 98%   BMI 40.41 kg/m   Wt Readings from Last 3 Encounters:  06/20/21 221 lb (100.2 kg)   12/20/20 220 lb (99.8 kg)  08/19/20 220 lb (99.8 kg)    Physical Exam Vitals and nursing note reviewed. Exam conducted with a chaperone present.  Constitutional:      General: She is not in acute distress.    Appearance: Normal appearance. She is not ill-appearing, toxic-appearing or diaphoretic.  HENT:     Head: Normocephalic and atraumatic.     Right Ear: Tympanic membrane, ear canal and external ear normal. There is no impacted cerumen.     Left Ear: Tympanic membrane, ear canal and external ear normal. There is no impacted cerumen.     Nose: Nose normal. No congestion or rhinorrhea.     Mouth/Throat:     Mouth: Mucous membranes are moist.     Pharynx: Oropharynx is clear. No oropharyngeal exudate or posterior oropharyngeal erythema.  Eyes:     General: No scleral icterus.       Right eye: No discharge.        Left eye: No discharge.     Extraocular Movements: Extraocular movements intact.     Conjunctiva/sclera: Conjunctivae normal.     Pupils: Pupils are equal, round, and reactive to light.  Neck:     Vascular: No carotid bruit.  Cardiovascular:     Rate and Rhythm: Normal rate and regular rhythm.     Pulses: Normal pulses.     Heart sounds: No murmur heard.   No friction rub. No gallop.  Pulmonary:     Effort: Pulmonary effort is normal. No respiratory distress.     Breath sounds: Normal breath sounds. No stridor. No wheezing, rhonchi or rales.  Chest:     Chest wall: No tenderness.  Breasts:    Right: Normal. No swelling, bleeding, inverted nipple, mass, nipple discharge, skin change or tenderness.     Left: Normal. No swelling, bleeding, inverted nipple, mass, nipple discharge, skin change or tenderness.  Abdominal:     General: Abdomen is flat. Bowel sounds are normal. There is no distension.     Palpations: Abdomen is soft. There is no mass.     Tenderness: There is no abdominal tenderness. There is no right CVA tenderness, left CVA tenderness, guarding or  rebound.     Hernia: No hernia is present.  Genitourinary:    Labia:        Right: No rash, tenderness, lesion or injury.        Left: No rash, tenderness, lesion or injury.      Urethra: No prolapse, urethral pain, urethral swelling or urethral lesion.     Vagina: Normal.     Cervix: Normal.     Uterus: Normal.      Adnexa: Right adnexa normal and left adnexa normal.  Musculoskeletal:        General: No swelling, tenderness, deformity or signs of injury.     Cervical back: Normal range of motion and neck supple. No rigidity. No muscular tenderness.     Right lower leg: No edema.     Left lower leg: No edema.  Lymphadenopathy:     Cervical: No cervical adenopathy.  Skin:    General: Skin is warm and dry.     Capillary Refill: Capillary refill  takes less than 2 seconds.     Coloration: Skin is not jaundiced or pale.     Findings: No bruising, erythema, lesion or rash.  Neurological:     General: No focal deficit present.     Mental Status: She is alert and oriented to person, place, and time. Mental status is at baseline.     Cranial Nerves: No cranial nerve deficit.     Sensory: No sensory deficit.     Motor: No weakness.     Coordination: Coordination normal.     Gait: Gait normal.     Deep Tendon Reflexes: Reflexes normal.  Psychiatric:        Mood and Affect: Mood normal.        Behavior: Behavior normal.        Thought Content: Thought content normal.        Judgment: Judgment normal.    Results for orders placed or performed in visit on 06/20/21  Urinalysis, Routine w reflex microscopic  Result Value Ref Range   Specific Gravity, UA 1.025 1.005 - 1.030   pH, UA 5.5 5.0 - 7.5   Color, UA Yellow Yellow   Appearance Ur Cloudy (A) Clear   Leukocytes,UA Negative Negative   Protein,UA Negative Negative/Trace   Glucose, UA Negative Negative   Ketones, UA Trace (A) Negative   RBC, UA Negative Negative   Bilirubin, UA Negative Negative   Urobilinogen, Ur 0.2 0.2 - 1.0  mg/dL   Nitrite, UA Negative Negative  Microalbumin, Urine Waived  Result Value Ref Range   Microalb, Ur Waived 10 0 - 19 mg/L   Creatinine, Urine Waived 300 10 - 300 mg/dL   Microalb/Creat Ratio <30 <30 mg/g      Assessment & Plan:   Problem List Items Addressed This Visit       Cardiovascular and Mediastinum   Paroxysmal supraventricular tachycardia (HCC)    Under good control on current regimen. Continue current regimen. Continue to monitor. Call with any concerns. Refills given. Labs drawn today.       Relevant Medications   propranolol (INDERAL) 40 MG tablet   Hypertension    Under good control on current regimen. Continue current regimen. Continue to monitor. Call with any concerns. Refills given. Labs drawn today.       Relevant Medications   propranolol (INDERAL) 40 MG tablet   Other Relevant Orders   CBC with Differential/Platelet   Comprehensive metabolic panel   Urinalysis, Routine w reflex microscopic (Completed)   TSH   Microalbumin, Urine Waived (Completed)   Migraines    Under good control on current regimen. Continue current regimen. Continue to monitor. Call with any concerns. Refills given. Labs drawn today.       Relevant Medications   EMGALITY 120 MG/ML SOAJ   rizatriptan (MAXALT) 10 MG tablet   propranolol (INDERAL) 40 MG tablet   gabapentin (NEURONTIN) 300 MG capsule   cyclobenzaprine (FLEXERIL) 5 MG tablet     Respiratory   Asthma    Under good control on current regimen. Continue current regimen. Continue to monitor. Call with any concerns. Refills given. Labs drawn today.       Relevant Medications   montelukast (SINGULAIR) 10 MG tablet   albuterol (VENTOLIN HFA) 108 (90 Base) MCG/ACT inhaler   albuterol (PROVENTIL) (2.5 MG/3ML) 0.083% nebulizer solution   Other Relevant Orders   Comprehensive metabolic panel     Digestive   GERD (gastroesophageal reflux disease)    Under good control  on current regimen. Continue current regimen.  Continue to monitor. Call with any concerns. Refills given. Labs drawn today.       Relevant Medications   omeprazole (PRILOSEC) 40 MG capsule   ondansetron (ZOFRAN ODT) 4 MG disintegrating tablet   Other Relevant Orders   Comprehensive metabolic panel     Other   Iron deficiency anemia    Under good control on current regimen. Continue current regimen. Continue to monitor. Call with any concerns. Refills given. Labs drawn today.       Relevant Orders   CBC with Differential/Platelet   Comprehensive metabolic panel   Iron and TIBC   Ferritin   Anxiety    Stable. Continue to follow with psychiatry. Call with any concerns.       Relevant Orders   Comprehensive metabolic panel   History of Roux-en-Y gastric bypass    Labs drawn today. Await results. Treat as needed.       Relevant Orders   Comprehensive metabolic panel   Vitamin D deficiency    Rechecking labs today. Await results. Treat as needed.       Relevant Orders   Comprehensive metabolic panel   VITAMIN D 25 Hydroxy (Vit-D Deficiency, Fractures)   Mild episode of recurrent major depressive disorder (HCC)    Stable. Continue to follow with psychiatry. Call with any concerns.       Relevant Orders   Comprehensive metabolic panel   Other Visit Diagnoses     Routine general medical examination at a health care facility    -  Primary   Vaccines up to date. Screening labs checked today. Pap done. Mammogram and colonoscopy ordered. Continue diet and exercise. Call with any concerns.    Encounter for screening mammogram for malignant neoplasm of breast       Mammogram odered today.   Relevant Orders   MM DIAG BREAST TOMO BILATERAL   Routine screening for STI (sexually transmitted infection)       Labs drawn today. Await results.    Relevant Orders   HIV Antibody (routine testing w rflx)   RPR   GC/Chlamydia Probe Amp   HSV(herpes simplex vrs) 1+2 ab-IgG   Acute Viral Hepatitis (HAV, HBV, HCV)   Screening  for cervical cancer       Pap done today.   Relevant Orders   Cytology - PAP   Screening for cholesterol level       Labs drawn today. Await results.    Relevant Orders   Lipid Panel w/o Chol/HDL Ratio   Screening for colon cancer       Referral to GI made today.   Relevant Orders   Ambulatory referral to Gastroenterology        Follow up plan: Return in about 6 months (around 12/20/2021) for 6 months with me, 3 months for nurse visit for 2nd shingrix.   LABORATORY TESTING:  - Pap smear: pap done  IMMUNIZATIONS:   - Tdap: Tetanus vaccination status reviewed: last tetanus booster within 10 years. - Influenza: Postponed to flu season - Pneumovax: Up to date - Prevnar: Not applicable - COVID: Refused - Shingrix vaccine: Administered today  SCREENING: -Mammogram: Ordered today  - Colonoscopy: Ordered today    PATIENT COUNSELING:   Advised to take 1 mg of folate supplement per day if capable of pregnancy.   Sexuality: Discussed sexually transmitted diseases, partner selection, use of condoms, avoidance of unintended pregnancy  and contraceptive alternatives.   Advised to avoid cigarette  smoking.  I discussed with the patient that most people either abstain from alcohol or drink within safe limits (<=14/week and <=4 drinks/occasion for males, <=7/weeks and <= 3 drinks/occasion for females) and that the risk for alcohol disorders and other health effects rises proportionally with the number of drinks per week and how often a drinker exceeds daily limits.  Discussed cessation/primary prevention of drug use and availability of treatment for abuse.   Diet: Encouraged to adjust caloric intake to maintain  or achieve ideal body weight, to reduce intake of dietary saturated fat and total fat, to limit sodium intake by avoiding high sodium foods and not adding table salt, and to maintain adequate dietary potassium and calcium preferably from fresh fruits, vegetables, and low-fat dairy  products.    stressed the importance of regular exercise  Injury prevention: Discussed safety belts, safety helmets, smoke detector, smoking near bedding or upholstery.   Dental health: Discussed importance of regular tooth brushing, flossing, and dental visits.    NEXT PREVENTATIVE PHYSICAL DUE IN 1 YEAR. Return in about 6 months (around 12/20/2021) for 6 months with me, 3 months for nurse visit for 2nd shingrix.

## 2021-06-20 NOTE — Assessment & Plan Note (Signed)
Rechecking labs today. Await results. Treat as needed.  °

## 2021-06-20 NOTE — Assessment & Plan Note (Signed)
Labs drawn today. Await results. Treat as needed.  

## 2021-06-21 LAB — COMPREHENSIVE METABOLIC PANEL
ALT: 20 IU/L (ref 0–32)
AST: 21 IU/L (ref 0–40)
Albumin/Globulin Ratio: 2.1 (ref 1.2–2.2)
Albumin: 4.8 g/dL (ref 3.8–4.8)
Alkaline Phosphatase: 85 IU/L (ref 44–121)
BUN/Creatinine Ratio: 28 — ABNORMAL HIGH (ref 9–23)
BUN: 17 mg/dL (ref 6–24)
Bilirubin Total: 0.3 mg/dL (ref 0.0–1.2)
CO2: 24 mmol/L (ref 20–29)
Calcium: 9.9 mg/dL (ref 8.7–10.2)
Chloride: 103 mmol/L (ref 96–106)
Creatinine, Ser: 0.61 mg/dL (ref 0.57–1.00)
Globulin, Total: 2.3 g/dL (ref 1.5–4.5)
Glucose: 75 mg/dL (ref 65–99)
Potassium: 4.5 mmol/L (ref 3.5–5.2)
Sodium: 143 mmol/L (ref 134–144)
Total Protein: 7.1 g/dL (ref 6.0–8.5)
eGFR: 109 mL/min/{1.73_m2} (ref >59)

## 2021-06-21 LAB — CBC WITH DIFFERENTIAL/PLATELET
Basophils Absolute: 0 10*3/uL (ref 0.0–0.2)
Basos: 1 %
EOS (ABSOLUTE): 0.1 10*3/uL (ref 0.0–0.4)
Eos: 2 %
Hematocrit: 40.5 % (ref 34.0–46.6)
Hemoglobin: 13.4 g/dL (ref 11.1–15.9)
Immature Grans (Abs): 0 10*3/uL (ref 0.0–0.1)
Immature Granulocytes: 0 %
Lymphocytes Absolute: 1.6 10*3/uL (ref 0.7–3.1)
Lymphs: 25 %
MCH: 29.5 pg (ref 26.6–33.0)
MCHC: 33.1 g/dL (ref 31.5–35.7)
MCV: 89 fL (ref 79–97)
Monocytes Absolute: 0.4 10*3/uL (ref 0.1–0.9)
Monocytes: 7 %
Neutrophils Absolute: 4.1 10*3/uL (ref 1.4–7.0)
Neutrophils: 65 %
Platelets: 329 10*3/uL (ref 150–450)
RBC: 4.55 x10E6/uL (ref 3.77–5.28)
RDW: 11.9 % (ref 11.7–15.4)
WBC: 6.3 10*3/uL (ref 3.4–10.8)

## 2021-06-21 LAB — HSV(HERPES SIMPLEX VRS) I + II AB-IGG
HSV 1 Glycoprotein G Ab, IgG: <0.91 index (ref 0.00–0.90)
HSV 2 IgG, Type Spec: 11.9 index — ABNORMAL HIGH (ref 0.00–0.90)

## 2021-06-21 LAB — ACUTE VIRAL HEPATITIS (HAV, HBV, HCV)
HCV Ab: 0.1 s/co ratio (ref 0.0–0.9)
Hep A IgM: NEGATIVE
Hep B C IgM: NEGATIVE
Hepatitis B Surface Ag: NEGATIVE

## 2021-06-21 LAB — LIPID PANEL W/O CHOL/HDL RATIO
Cholesterol, Total: 203 mg/dL — ABNORMAL HIGH (ref 100–199)
HDL: 67 mg/dL (ref >39)
LDL Chol Calc (NIH): 114 mg/dL — ABNORMAL HIGH (ref 0–99)
Triglycerides: 128 mg/dL (ref 0–149)
VLDL Cholesterol Cal: 22 mg/dL (ref 5–40)

## 2021-06-21 LAB — FERRITIN: Ferritin: 24 ng/mL (ref 15–150)

## 2021-06-21 LAB — TSH: TSH: 1.32 u[IU]/mL (ref 0.450–4.500)

## 2021-06-21 LAB — IRON AND TIBC
Iron Saturation: 22 % (ref 15–55)
Iron: 79 ug/dL (ref 27–159)
Total Iron Binding Capacity: 360 ug/dL (ref 250–450)
UIBC: 281 ug/dL (ref 131–425)

## 2021-06-21 LAB — RPR: RPR Ser Ql: NONREACTIVE

## 2021-06-21 LAB — HCV INTERPRETATION

## 2021-06-21 LAB — VITAMIN D 25 HYDROXY (VIT D DEFICIENCY, FRACTURES): Vit D, 25-Hydroxy: 48.9 ng/mL (ref 30.0–100.0)

## 2021-06-21 LAB — HIV ANTIBODY (ROUTINE TESTING W REFLEX): HIV Screen 4th Generation wRfx: NONREACTIVE

## 2021-06-23 ENCOUNTER — Encounter: Payer: Self-pay | Admitting: Family Medicine

## 2021-06-23 LAB — GC/CHLAMYDIA PROBE AMP
Chlamydia trachomatis, NAA: NEGATIVE
Neisseria Gonorrhoeae by PCR: NEGATIVE

## 2021-06-24 LAB — CYTOLOGY - PAP
Adequacy: ABSENT
Comment: NEGATIVE
Diagnosis: NEGATIVE
High risk HPV: NEGATIVE

## 2021-07-04 DIAGNOSIS — Z20828 Contact with and (suspected) exposure to other viral communicable diseases: Secondary | ICD-10-CM | POA: Diagnosis not present

## 2021-07-13 ENCOUNTER — Encounter: Payer: Self-pay | Admitting: Family Medicine

## 2021-07-14 NOTE — Telephone Encounter (Signed)
Has anyone seen this? I don't think media shows up on mychart.

## 2021-07-15 ENCOUNTER — Encounter: Payer: Self-pay | Admitting: Family Medicine

## 2021-08-01 DIAGNOSIS — Z6841 Body Mass Index (BMI) 40.0 and over, adult: Secondary | ICD-10-CM | POA: Diagnosis not present

## 2021-08-01 DIAGNOSIS — Z8742 Personal history of other diseases of the female genital tract: Secondary | ICD-10-CM | POA: Diagnosis not present

## 2021-08-01 DIAGNOSIS — N939 Abnormal uterine and vaginal bleeding, unspecified: Secondary | ICD-10-CM | POA: Diagnosis not present

## 2021-08-12 DIAGNOSIS — B351 Tinea unguium: Secondary | ICD-10-CM | POA: Diagnosis not present

## 2021-08-12 DIAGNOSIS — L851 Acquired keratosis [keratoderma] palmaris et plantaris: Secondary | ICD-10-CM | POA: Diagnosis not present

## 2021-08-12 LAB — HM MAMMOGRAPHY

## 2021-08-22 DIAGNOSIS — N939 Abnormal uterine and vaginal bleeding, unspecified: Secondary | ICD-10-CM | POA: Diagnosis not present

## 2021-09-03 DIAGNOSIS — M50222 Other cervical disc displacement at C5-C6 level: Secondary | ICD-10-CM | POA: Diagnosis not present

## 2021-09-03 DIAGNOSIS — M47812 Spondylosis without myelopathy or radiculopathy, cervical region: Secondary | ICD-10-CM | POA: Diagnosis not present

## 2021-09-03 DIAGNOSIS — M25511 Pain in right shoulder: Secondary | ICD-10-CM | POA: Diagnosis not present

## 2021-09-03 DIAGNOSIS — M19011 Primary osteoarthritis, right shoulder: Secondary | ICD-10-CM | POA: Diagnosis not present

## 2021-09-19 ENCOUNTER — Ambulatory Visit: Payer: BC Managed Care – PPO

## 2021-09-22 ENCOUNTER — Other Ambulatory Visit: Payer: Self-pay | Admitting: Family Medicine

## 2021-09-24 NOTE — Telephone Encounter (Signed)
Change of pharmacy Requested Prescriptions  Pending Prescriptions Disp Refills  . diclofenac Sodium (VOLTAREN) 1 % GEL [Pharmacy Med Name: DICLOFENAC SODIUM 1% GEL] 100 g 0    Sig: Apply 4 g topically 4 (four) times daily.     Analgesics:  Topicals Passed - 09/22/2021  4:37 PM      Passed - Valid encounter within last 12 months    Recent Outpatient Visits          3 months ago Routine general medical examination at a health care facility   [facility] [place] Hights, Megan P, DO   9 months ago Primary hypertension   [facility] Prentice, Megan P, DO   1 year ago Osteoarthritis, unspecified osteoarthritis type, unspecified site   [facility], [place], DO   1 year ago Chronic migraine without aura without status migrainosus, not intractable   [facility] [place], Potter, DO   1 year ago Essential hypertension   [facility] Dorcas Carrow, DO      Future Appointments            In 2 months Laural Benes, Oralia Rud, DO [employer]

## 2021-09-28 DIAGNOSIS — Z6841 Body Mass Index (BMI) 40.0 and over, adult: Secondary | ICD-10-CM | POA: Diagnosis not present

## 2021-09-28 DIAGNOSIS — Z79899 Other long term (current) drug therapy: Secondary | ICD-10-CM | POA: Diagnosis not present

## 2021-09-28 DIAGNOSIS — Z885 Allergy status to narcotic agent status: Secondary | ICD-10-CM | POA: Diagnosis not present

## 2021-09-28 DIAGNOSIS — D649 Anemia, unspecified: Secondary | ICD-10-CM | POA: Diagnosis not present

## 2021-09-28 DIAGNOSIS — I10 Essential (primary) hypertension: Secondary | ICD-10-CM | POA: Diagnosis not present

## 2021-09-28 DIAGNOSIS — Z7951 Long term (current) use of inhaled steroids: Secondary | ICD-10-CM | POA: Diagnosis not present

## 2021-09-28 DIAGNOSIS — N939 Abnormal uterine and vaginal bleeding, unspecified: Secondary | ICD-10-CM | POA: Diagnosis not present

## 2021-09-28 DIAGNOSIS — Z886 Allergy status to analgesic agent status: Secondary | ICD-10-CM | POA: Diagnosis not present

## 2021-09-28 DIAGNOSIS — C569 Malignant neoplasm of unspecified ovary: Secondary | ICD-10-CM | POA: Diagnosis not present

## 2021-12-19 ENCOUNTER — Ambulatory Visit (INDEPENDENT_AMBULATORY_CARE_PROVIDER_SITE_OTHER): Payer: Managed Care, Other (non HMO) | Admitting: Family Medicine

## 2021-12-19 ENCOUNTER — Other Ambulatory Visit: Payer: Self-pay

## 2021-12-19 ENCOUNTER — Encounter: Payer: Self-pay | Admitting: Family Medicine

## 2021-12-19 VITALS — BP 131/79 | HR 73 | Temp 98.2°F | Wt 220.2 lb

## 2021-12-19 DIAGNOSIS — K219 Gastro-esophageal reflux disease without esophagitis: Secondary | ICD-10-CM | POA: Diagnosis not present

## 2021-12-19 DIAGNOSIS — I10 Essential (primary) hypertension: Secondary | ICD-10-CM

## 2021-12-19 DIAGNOSIS — F330 Major depressive disorder, recurrent, mild: Secondary | ICD-10-CM | POA: Diagnosis not present

## 2021-12-19 DIAGNOSIS — E559 Vitamin D deficiency, unspecified: Secondary | ICD-10-CM | POA: Diagnosis not present

## 2021-12-19 DIAGNOSIS — I471 Supraventricular tachycardia, unspecified: Secondary | ICD-10-CM

## 2021-12-19 DIAGNOSIS — J45909 Unspecified asthma, uncomplicated: Secondary | ICD-10-CM

## 2021-12-19 DIAGNOSIS — F419 Anxiety disorder, unspecified: Secondary | ICD-10-CM

## 2021-12-19 MED ORDER — NYSTATIN 100000 UNIT/GM EX CREA: 1.0000 "application " | TOPICAL_CREAM | Freq: Two times a day (BID) | CUTANEOUS | 5 refills | Status: DC

## 2021-12-19 MED ORDER — GABAPENTIN 300 MG PO CAPS: 600.0000 mg | ORAL_CAPSULE | Freq: Two times a day (BID) | ORAL | 1 refills | Status: DC

## 2021-12-19 MED ORDER — ALBUTEROL SULFATE (2.5 MG/3ML) 0.083% IN NEBU: 2.5000 mg | INHALATION_SOLUTION | Freq: Four times a day (QID) | RESPIRATORY_TRACT | 6 refills | Status: DC | PRN

## 2021-12-19 MED ORDER — FEXOFENADINE HCL 180 MG PO TABS: 180.0000 mg | ORAL_TABLET | Freq: Every day | ORAL | 3 refills | Status: DC

## 2021-12-19 MED ORDER — CYCLOBENZAPRINE HCL 5 MG PO TABS
5.0000 mg | ORAL_TABLET | Freq: Two times a day (BID) | ORAL | 1 refills | Status: DC | PRN
Start: 2021-12-19 — End: 2023-02-28

## 2021-12-19 MED ORDER — BUDESONIDE-FORMOTEROL FUMARATE 160-4.5 MCG/ACT IN AERO: 2.0000 | INHALATION_SPRAY | Freq: Two times a day (BID) | RESPIRATORY_TRACT | 3 refills | Status: DC

## 2021-12-19 MED ORDER — MONTELUKAST SODIUM 10 MG PO TABS: 10.0000 mg | ORAL_TABLET | Freq: Every day | ORAL | 1 refills | Status: DC

## 2021-12-19 MED ORDER — OMEPRAZOLE 40 MG PO CPDR: 40.0000 mg | DELAYED_RELEASE_CAPSULE | Freq: Two times a day (BID) | ORAL | 1 refills | Status: DC

## 2021-12-19 MED ORDER — NYSTATIN 100000 UNIT/GM EX POWD
Freq: Three times a day (TID) | CUTANEOUS | 5 refills | Status: DC
Start: 2021-12-19 — End: 2023-03-18

## 2021-12-19 MED ORDER — ALBUTEROL SULFATE HFA 108 (90 BASE) MCG/ACT IN AERS: INHALATION_SPRAY | RESPIRATORY_TRACT | 6 refills | Status: DC

## 2021-12-19 MED ORDER — DICLOFENAC SODIUM 1 % EX GEL: 4.0000 g | Freq: Four times a day (QID) | CUTANEOUS | 0 refills | Status: DC

## 2021-12-19 MED ORDER — PROPRANOLOL HCL 40 MG PO TABS: 40.0000 mg | ORAL_TABLET | Freq: Two times a day (BID) | ORAL | 1 refills | Status: DC

## 2021-12-19 NOTE — Assessment & Plan Note (Signed)
Under good control on current regimen. Continue current regimen. Continue to monitor. Call with any concerns. Refills given. Labs drawn today.   

## 2021-12-19 NOTE — Assessment & Plan Note (Signed)
Labs drawn today. Await results.  
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
## 2021-12-19 NOTE — Assessment & Plan Note (Signed)
Continue to follow with psychiatry. Call with any concerns.  ?

## 2021-12-20 ENCOUNTER — Encounter: Payer: Self-pay | Admitting: Family Medicine

## 2021-12-20 ENCOUNTER — Other Ambulatory Visit: Payer: Self-pay | Admitting: Family Medicine

## 2021-12-20 DIAGNOSIS — D508 Other iron deficiency anemias: Secondary | ICD-10-CM

## 2021-12-20 LAB — COMPREHENSIVE METABOLIC PANEL
ALT: 16 IU/L (ref 0–32)
AST: 21 IU/L (ref 0–40)
Albumin/Globulin Ratio: 1.9 (ref 1.2–2.2)
Albumin: 4.6 g/dL (ref 3.8–4.8)
Alkaline Phosphatase: 61 IU/L (ref 44–121)
BUN/Creatinine Ratio: 25 — ABNORMAL HIGH (ref 9–23)
BUN: 16 mg/dL (ref 6–24)
Bilirubin Total: 0.2 mg/dL (ref 0.0–1.2)
CO2: 22 mmol/L (ref 20–29)
Calcium: 9.4 mg/dL (ref 8.7–10.2)
Chloride: 107 mmol/L — ABNORMAL HIGH (ref 96–106)
Creatinine, Ser: 0.64 mg/dL (ref 0.57–1.00)
Globulin, Total: 2.4 g/dL (ref 1.5–4.5)
Glucose: 88 mg/dL (ref 70–99)
Potassium: 5.1 mmol/L (ref 3.5–5.2)
Sodium: 143 mmol/L (ref 134–144)
Total Protein: 7 g/dL (ref 6.0–8.5)
eGFR: 108 mL/min/{1.73_m2} (ref >59)

## 2021-12-20 LAB — CBC WITH DIFFERENTIAL/PLATELET
Basophils Absolute: 0 10*3/uL (ref 0.0–0.2)
Basos: 1 %
EOS (ABSOLUTE): 0.2 10*3/uL (ref 0.0–0.4)
Eos: 3 %
Hematocrit: 35.5 % (ref 34.0–46.6)
Hemoglobin: 10.9 g/dL — ABNORMAL LOW (ref 11.1–15.9)
Immature Grans (Abs): 0 10*3/uL (ref 0.0–0.1)
Immature Granulocytes: 0 %
Lymphocytes Absolute: 1.4 10*3/uL (ref 0.7–3.1)
Lymphs: 22 %
MCH: 23.5 pg — ABNORMAL LOW (ref 26.6–33.0)
MCHC: 30.7 g/dL — ABNORMAL LOW (ref 31.5–35.7)
MCV: 77 fL — ABNORMAL LOW (ref 79–97)
Monocytes Absolute: 0.5 10*3/uL (ref 0.1–0.9)
Monocytes: 8 %
Neutrophils Absolute: 4.3 10*3/uL (ref 1.4–7.0)
Neutrophils: 66 %
Platelets: 365 10*3/uL (ref 150–450)
RBC: 4.64 x10E6/uL (ref 3.77–5.28)
RDW: 13.2 % (ref 11.7–15.4)
WBC: 6.4 10*3/uL (ref 3.4–10.8)

## 2021-12-20 LAB — VITAMIN D 25 HYDROXY (VIT D DEFICIENCY, FRACTURES): Vit D, 25-Hydroxy: 36.1 ng/mL (ref 30.0–100.0)

## 2021-12-20 LAB — TSH: TSH: 0.964 u[IU]/mL (ref 0.450–4.500)

## 2022-03-01 ENCOUNTER — Encounter: Payer: Self-pay | Admitting: Family Medicine

## 2022-03-02 ENCOUNTER — Encounter: Payer: Self-pay | Admitting: Family Medicine

## 2022-03-02 ENCOUNTER — Telehealth: Payer: Commercial Managed Care - HMO | Admitting: Urgent Care

## 2022-03-02 ENCOUNTER — Encounter: Payer: Self-pay | Admitting: Urgent Care

## 2022-03-02 DIAGNOSIS — J3489 Other specified disorders of nose and nasal sinuses: Secondary | ICD-10-CM

## 2022-03-02 DIAGNOSIS — R0981 Nasal congestion: Secondary | ICD-10-CM

## 2022-03-02 DIAGNOSIS — J301 Allergic rhinitis due to pollen: Secondary | ICD-10-CM | POA: Diagnosis not present

## 2022-03-02 MED ORDER — LEVOCETIRIZINE DIHYDROCHLORIDE 5 MG PO TABS: 5.0000 mg | ORAL_TABLET | Freq: Every evening | ORAL | 2 refills | Status: DC

## 2022-03-02 MED ORDER — IPRATROPIUM BROMIDE 0.03 % NA SOLN: 2.0000 | Freq: Two times a day (BID) | NASAL | 2 refills | Status: DC

## 2022-03-02 NOTE — Telephone Encounter (Signed)
LVM asking patient to call back to schedule an appt 

## 2022-03-02 NOTE — Telephone Encounter (Signed)
Appt virtual OK ?

## 2022-03-02 NOTE — Progress Notes (Signed)
?Virtual Visit Consent  ? ?Nancy Sanchez, you are scheduled for a virtual visit with a [facility] provider today. Just as with appointments in the office, your consent must be obtained to participate. Your consent will be active for this visit and any virtual visit you may have with one of our providers in the next 365 days. If you have a MyChart account, a copy of this consent can be sent to you electronically. ? ?As this is a virtual visit, video technology does not allow for your provider to perform a traditional examination. This may limit your provider's ability to fully assess your condition. If your provider identifies any concerns that need to be evaluated in person or the need to arrange testing (such as labs, EKG, etc.), we will make arrangements to do so. Although advances in technology are sophisticated, we cannot ensure that it will always work on either your end or our end. If the connection with a video visit is poor, the visit may have to be switched to a telephone visit. With either a video or telephone visit, we are not always able to ensure that we have a secure connection. ? ?By engaging in this virtual visit, you consent to the provision of healthcare and authorize for your insurance to be billed (if applicable) for the services provided during this visit. Depending on your insurance coverage, you may receive a charge related to this service. ? ?I need to obtain your verbal consent now. Are you willing to proceed with your visit today? Nancy Sanchez has provided verbal consent on 03/02/2022 for a virtual visit (video or telephone). Nancy Trostle L Porchia Sinkler, PA ? ?Date: 03/02/2022 5:13 PM ? ?Virtual Visit via Video Note  ? ?I, Nancy Sanchez, connected with  Nancy Sanchez  ([id number], 1971/04/10) on 03/02/22 at  5:00 PM EDT by a video-enabled telemedicine application and verified that I am speaking with the correct person using two identifiers. ? ?Location: ?Patient: Virtual visit  Location Patient: In car ?Provider: Virtual Visit Location Provider: Home Office ?  ?I discussed the limitations of evaluation and management by telemedicine and the availability of in person appointments. The patient expressed understanding and agreed to proceed.   ? ?History of Present Illness: ?Nancy Sanchez is a 51 y.o. who identifies as a female who was assigned female at birth, and is being seen today for sinus congestion. ? ?HPI: Pleasant 51yo female presents today primarily requesting refill of her atrovent nasal spray. She was seen at [facility] on 02/20/22 due to sore throat, sinus congestion, and ear pressure. She states she was given Rx for atrovent nasal spray, and told to also take flonase and sudafed. She reports doing this as directed and feels like the congestion and ear pressure is resolving, and sore throat is gone. She ran out of the atrovent, but would like to continue for another week or so as there is still some remaining congestion. She has used antihistamines in the past and would like a refill. She denies fever, SOB, CP, teeth pain or any new or worsening symptoms. She feels that the treatment is effective. She does have a sporadic dry cough. No additional complaints. ? ? ?Problems:  ?Patient Active Problem List  ? Diagnosis Date Noted  ? Impingement syndrome of right shoulder 05/04/2019  ? Degenerative tear of medial meniscus of right knee 04/08/2019  ? Morbid obesity (HCC) 01/19/2019  ? Sprain of calcaneofibular ligament of right ankle 12/12/2018  ? S/P total knee arthroplasty,  left 09/30/2018  ? Abnormal uterine bleeding 05/16/2018  ? Barrett's esophagus with dysplasia 04/07/2018  ? Vitamin D deficiency 03/08/2018  ? Vaginal low risk HPV DNA test positive 01/26/2018  ? Left inguinal hernia 11/14/2017  ? Posterior tibial tendon dysfunction (PTTD) of right lower extremity 11/14/2017  ? Mild episode of recurrent major depressive disorder (HCC) 06/27/2017  ? Hidradenitis axillaris 05/28/2017   ? History of Roux-en-Y gastric bypass 05/01/2017  ? Migraines 05/01/2017  ? Anxiety   ? Osteoarthritis   ? Asthma   ? GERD (gastroesophageal reflux disease)   ? Hypertension   ? Osteoporosis   ? Iron deficiency anemia 02/27/2017  ? Paroxysmal supraventricular tachycardia (HCC) 02/27/2017  ? Seasonal allergic rhinitis 01/04/2016  ? Primary osteoarthritis of left hip 01/29/2015  ? S/P gastric bypass 01/04/2015  ? Chronic hip pain 09/24/2014  ? Primary osteoarthritis of one knee, left 09/24/2014  ? Edema 06/30/2013  ? Acquired pes planus of both feet 05/19/2013  ? Calcific Achilles tendonitis 05/19/2013  ? Carpal tunnel syndrome 06/14/2012  ?  ?Allergies:  ?Allergies  ?Allergen Reactions  ? Metronidazole Hives, Itching and Rash  ? Oxycodone-Acetaminophen Nausea And Vomiting  ?  Other reaction(s): Nausea And Vomiting ?nausea ?nausea  ? Prednisone Anxiety  ?  Other reaction(s): Other (See Comments) ?"agitated"  ? Tape Rash  ? Aspirin   ?  Unable to take aspirin products since RNY Gastric Bypass  ? Codeine   ?  Other reaction(s): Other (See Comments)  ? Nickel Itching  ?  Itching from earrings -unsure of composition  ? Nsaids Other (See Comments)  ?  Gastric bypass  ? Other Itching  ?  Itching from earrings -unsure of composition  ? Oxycodone Other (See Comments)  ?  Other reaction(s): Vomiting  ? Fluticasone-Salmeterol Anxiety  ?  Other Reaction: jittery ?Other Reaction: jittery ?Other Reaction: jittery ?Other Reaction: jittery ?Other Reaction: jittery  ? Latex Rash  ?  Other Reaction: Rash on hands only ?Other Reaction: Rash on hands only  ? Morphine Anxiety and Itching  ?  Does well with dilaudid and toradol ?Does well with dilaudid and toradol  ? ?Medications:  ?Current Outpatient Medications:  ?  ipratropium (ATROVENT) 0.03 % nasal spray, Place 2 sprays into both nostrils every 12 (twelve) hours., Disp: 30 mL, Rfl: 2 ?  levocetirizine (XYZAL) 5 MG tablet, Take 1 tablet (5 mg total) by mouth every evening., Disp: 30  tablet, Rfl: 2 ?  acetaminophen (TYLENOL) 650 MG CR tablet, Take by mouth., Disp: , Rfl:  ?  albuterol (PROVENTIL) (2.5 MG/3ML) 0.083% nebulizer solution, Take 3 mLs (2.5 mg total) by nebulization every 6 (six) hours as needed for wheezing or shortness of breath., Disp: 150 mL, Rfl: 6 ?  albuterol (VENTOLIN HFA) 108 (90 Base) MCG/ACT inhaler, 1-2 puffs by inhalation 4 times a day when needed, Disp: 18 g, Rfl: 6 ?  ascorbic acid (VITAMIN C) 500 MG tablet, Take by mouth., Disp: , Rfl:  ?  Biotin 1 MG CAPS, Take by mouth., Disp: , Rfl:  ?  budesonide-formoterol (SYMBICORT) 160-4.5 MCG/ACT inhaler, Inhale 2 puffs into the lungs 2 (two) times daily., Disp: 10.2 g, Rfl: 3 ?  buPROPion (WELLBUTRIN XL) 150 MG 24 hr tablet, Take 150 mg by mouth every morning., Disp: , Rfl:  ?  buPROPion (WELLBUTRIN XL) 300 MG 24 hr tablet, Take 1 tablet (300 mg total) by mouth daily., Disp: 90 tablet, Rfl: 3 ?  busPIRone (BUSPAR) 5 MG tablet, Take 1 tablet (  5 mg total) by mouth as needed (Take twice a day as needed for increased anxiety.)., Disp: 60 tablet, Rfl: 1 ?  calcium carbonate (OSCAL) 1500 (600 Ca) MG TABS tablet, Take by mouth daily. , Disp: , Rfl:  ?  Calcium-Magnesium-Zinc 333-133-5 MG TABS, Take 1 tablet by mouth daily., Disp: , Rfl:  ?  cyclobenzaprine (FLEXERIL) 5 MG tablet, Take 1 tablet (5 mg total) by mouth 2 (two) times daily as needed for muscle spasms., Disp: 180 tablet, Rfl: 1 ?  diclofenac Sodium (VOLTAREN) 1 % GEL, Apply 4 g topically 4 (four) times daily., Disp: 100 g, Rfl: 0 ?  EMGALITY 120 MG/ML SOAJ, Inject 1 mL into the skin every 30 (thirty) days., Disp: , Rfl:  ?  escitalopram (LEXAPRO) 20 MG tablet, Take 1 tablet (20 mg total) by mouth daily., Disp: 90 tablet, Rfl: 1 ?  ferrous sulfate 324 (65 Fe) MG TBEC, Take by mouth daily., Disp: , Rfl:  ?  fexofenadine (ALLEGRA) 180 MG tablet, Take 1 tablet (180 mg total) by mouth daily., Disp: 90 tablet, Rfl: 3 ?  fluticasone (CUTIVATE) 0.005 % ointment, Apply topically  as needed. , Disp: , Rfl:  ?  fluticasone (FLONASE) 50 MCG/ACT nasal spray, Place into the nose as needed. , Disp: , Rfl:  ?  gabapentin (NEURONTIN) 300 MG capsule, Take 2 capsules (600 mg total) by mouth

## 2022-03-02 NOTE — Patient Instructions (Signed)
?[name], thank you for joining Maretta Bees, PA for today's virtual visit.  While this provider is not your primary care provider (PCP), if your PCP is located in our provider database this encounter information will be shared with them immediately following your visit. ? ?Consent: ?(Patient) Nancy Sanchez provided verbal consent for this virtual visit at the beginning of the encounter. ? ?Current Medications: ? ?Current Outpatient Medications:  ?  ipratropium (ATROVENT) 0.03 % nasal spray, Place 2 sprays into both nostrils every 12 (twelve) hours., Disp: 30 mL, Rfl: 2 ?  levocetirizine (XYZAL) 5 MG tablet, Take 1 tablet (5 mg total) by mouth every evening., Disp: 30 tablet, Rfl: 2 ?  acetaminophen (TYLENOL) 650 MG CR tablet, Take by mouth., Disp: , Rfl:  ?  albuterol (PROVENTIL) (2.5 MG/3ML) 0.083% nebulizer solution, Take 3 mLs (2.5 mg total) by nebulization every 6 (six) hours as needed for wheezing or shortness of breath., Disp: 150 mL, Rfl: 6 ?  albuterol (VENTOLIN HFA) 108 (90 Base) MCG/ACT inhaler, 1-2 puffs by inhalation 4 times a day when needed, Disp: 18 g, Rfl: 6 ?  ascorbic acid (VITAMIN C) 500 MG tablet, Take by mouth., Disp: , Rfl:  ?  Biotin 1 MG CAPS, Take by mouth., Disp: , Rfl:  ?  budesonide-formoterol (SYMBICORT) 160-4.5 MCG/ACT inhaler, Inhale 2 puffs into the lungs 2 (two) times daily., Disp: 10.2 g, Rfl: 3 ?  buPROPion (WELLBUTRIN XL) 150 MG 24 hr tablet, Take 150 mg by mouth every morning., Disp: , Rfl:  ?  buPROPion (WELLBUTRIN XL) 300 MG 24 hr tablet, Take 1 tablet (300 mg total) by mouth daily., Disp: 90 tablet, Rfl: 3 ?  busPIRone (BUSPAR) 5 MG tablet, Take 1 tablet (5 mg total) by mouth as needed (Take twice a day as needed for increased anxiety.)., Disp: 60 tablet, Rfl: 1 ?  calcium carbonate (OSCAL) 1500 (600 Ca) MG TABS tablet, Take by mouth daily. , Disp: , Rfl:  ?  Calcium-Magnesium-Zinc 333-133-5 MG TABS, Take 1 tablet by mouth daily., Disp: , Rfl:  ?   cyclobenzaprine (FLEXERIL) 5 MG tablet, Take 1 tablet (5 mg total) by mouth 2 (two) times daily as needed for muscle spasms., Disp: 180 tablet, Rfl: 1 ?  diclofenac Sodium (VOLTAREN) 1 % GEL, Apply 4 g topically 4 (four) times daily., Disp: 100 g, Rfl: 0 ?  EMGALITY 120 MG/ML SOAJ, Inject 1 mL into the skin every 30 (thirty) days., Disp: , Rfl:  ?  escitalopram (LEXAPRO) 20 MG tablet, Take 1 tablet (20 mg total) by mouth daily., Disp: 90 tablet, Rfl: 1 ?  ferrous sulfate 324 (65 Fe) MG TBEC, Take by mouth daily., Disp: , Rfl:  ?  fexofenadine (ALLEGRA) 180 MG tablet, Take 1 tablet (180 mg total) by mouth daily., Disp: 90 tablet, Rfl: 3 ?  fluticasone (CUTIVATE) 0.005 % ointment, Apply topically as needed. , Disp: , Rfl:  ?  fluticasone (FLONASE) 50 MCG/ACT nasal spray, Place into the nose as needed. , Disp: , Rfl:  ?  gabapentin (NEURONTIN) 300 MG capsule, Take 2 capsules (600 mg total) by mouth 2 (two) times daily., Disp: 360 capsule, Rfl: 1 ?  magnesium oxide (MAG-OX) 400 MG tablet, Take 400 mg by mouth 2 (two) times daily., Disp: , Rfl:  ?  meclizine (ANTIVERT) 25 MG tablet, Take by mouth as needed. , Disp: , Rfl:  ?  Misc Natural Products (IN-FLA-MEND) CAPS, Take by mouth., Disp: , Rfl:  ?  montelukast (  SINGULAIR) 10 MG tablet, Take 1 tablet (10 mg total) by mouth daily., Disp: 90 tablet, Rfl: 1 ?  mupirocin ointment (BACTROBAN) 2 %, Place 1 application into the nose 2 (two) times daily., Disp: 22 g, Rfl: 0 ?  norethindrone (AYGESTIN) 5 MG tablet, Take by mouth., Disp: , Rfl:  ?  nystatin (MYCOSTATIN/NYSTOP) powder, Apply topically 3 (three) times daily., Disp: 90 g, Rfl: 5 ?  nystatin cream (MYCOSTATIN), Apply 1 application topically 2 (two) times daily., Disp: 90 g, Rfl: 5 ?  Omega-3 1000 MG CAPS, Take by mouth., Disp: , Rfl:  ?  omeprazole (PRILOSEC) 40 MG capsule, Take 1 capsule (40 mg total) by mouth 2 (two) times daily before a meal., Disp: 180 capsule, Rfl: 1 ?  ondansetron (ZOFRAN ODT) 4 MG  disintegrating tablet, Take 1 tablet (4 mg total) by mouth every 8 (eight) hours as needed for nausea or vomiting., Disp: 20 tablet, Rfl: 0 ?  propranolol (INDERAL) 40 MG tablet, Take 1 tablet (40 mg total) by mouth 2 (two) times daily., Disp: 180 tablet, Rfl: 1 ?  QUEtiapine (SEROQUEL) 25 MG tablet, Take 25 mg by mouth at bedtime., Disp: , Rfl:  ?  rizatriptan (MAXALT) 10 MG tablet, TAKE ONE TABLET BY MOUTH AT ONSET OF HEADACHE; MAY REPEAT ONE TABLET IN 2 HOURS IF NEEDED., Disp: 8 tablet, Rfl: 12 ?  silver sulfADIAZINE (SILVADENE) 1 % cream, Apply topically., Disp: , Rfl:  ?  TURMERIC PO, Take by mouth daily. , Disp: , Rfl:  ?  UNABLE TO FIND, Take by mouth daily. Part cherry capsule, Disp: , Rfl:   ? ?Medications ordered in this encounter:  ?Meds ordered this encounter  ?Medications  ? ipratropium (ATROVENT) 0.03 % nasal spray  ?  Sig: Place 2 sprays into both nostrils every 12 (twelve) hours.  ?  Dispense:  30 mL  ?  Refill:  2  ?  Order Specific Question:   Supervising Provider  ?  Answer:   Eber HongMILLER, BRIAN [[id number]]  ? levocetirizine (XYZAL) 5 MG tablet  ?  Sig: Take 1 tablet (5 mg total) by mouth every evening.  ?  Dispense:  30 tablet  ?  Refill:  2  ?  Order Specific Question:   Supervising Provider  ?  Answer:   Eber HongMILLER, BRIAN [[id number]]  ?  ? ?*If you need refills on other medications prior to your next appointment, please contact your pharmacy* ? ?Follow-Up: ?Call back or seek an in-person evaluation if the symptoms worsen or if the condition fails to improve as anticipated. ? ?Other Instructions ?You have been called in a refill of your atrovent nasal spray. You can use this in addition to your flonase. Stop if any nasal irritation or bleeding occurs. ?Consider nasal humidification and sinus rinses with saline as well.  ?Start taking xyzal at night, and stop all other antihistamines (allegra, zyrtec, claritin, etc) ?Please follow up with your PCP should symptoms persist, or any new symptoms occur. ? ? ?If you have  been instructed to have an in-person evaluation today at a local Urgent Care facility, please use the link below. It will take you to a list of all of our available [facility], including address, phone number and hours of operation. Please do not delay care.  ?[facility] ? ?If you or a family member do not have a primary care provider, use the link below to schedule a visit and establish care. When you choose a [facility]  primary care physician or advanced practice provider, you gain a long-term partner in health. ?Find a Primary Care Provider ? ?Learn more about [facility]'s in-office and virtual care options: ?[facility] - Get Care Now  ?

## 2022-03-14 ENCOUNTER — Encounter: Payer: Self-pay | Admitting: Family Medicine

## 2022-03-14 ENCOUNTER — Ambulatory Visit (INDEPENDENT_AMBULATORY_CARE_PROVIDER_SITE_OTHER): Payer: Commercial Managed Care - HMO | Admitting: Family Medicine

## 2022-03-14 ENCOUNTER — Ambulatory Visit: Payer: Managed Care, Other (non HMO) | Admitting: Family Medicine

## 2022-03-14 VITALS — BP 118/86 | HR 89 | Temp 97.8°F | Wt 219.8 lb

## 2022-03-14 DIAGNOSIS — Z9109 Other allergy status, other than to drugs and biological substances: Secondary | ICD-10-CM | POA: Diagnosis not present

## 2022-03-14 DIAGNOSIS — Z111 Encounter for screening for respiratory tuberculosis: Secondary | ICD-10-CM

## 2022-03-14 NOTE — Progress Notes (Signed)
BP 118/86   Pulse 89   Temp 97.8 F (36.6 C)   Wt 219 lb 12.8 oz (99.7 kg)   LMP  (LMP Unknown)   SpO2 97%   BMI 40.19 kg/m    Subjective:    Patient ID: Nancy Sanchez, female    DOB: January 15, 1971, 51 y.o.   MRN: [id number]  HPI: Nancy Sanchez is a 51 y.o. female  Chief Complaint  Patient presents with   allery testing    Patient would like to have blood drawn for allergy testing, states her allergies are worse this year.    TB test    Patient states she is due for her yearly blood draw for TB    ALLERGIES Duration: months Runny nose: yes  Nasal congestion: yes Nasal itching: yes Sneezing: yes Eye swelling, itching or discharge: yes Post nasal drip: yes Cough: no Sinus pressure: yes  Ear pain: no  Ear pressure: no  Fever: no  Symptoms occur seasonally: no Symptoms occur perenially: yes Satisfied with current treatment: no Allergist evaluation in past: yes Allergen injection immunotherapy: yes Recurrent sinus infections: no ENT evaluation in past: no Known environmental allergy: yes Indoor pets: yes History of asthma: yes Current allergy medications: xyzal, singulair, flonase  Relevant past medical, surgical, family and social history reviewed and updated as indicated. Interim medical history since our last visit reviewed. Allergies and medications reviewed and updated.  Review of Systems  Per HPI unless specifically indicated above     Objective:    BP 118/86   Pulse 89   Temp 97.8 F (36.6 C)   Wt 219 lb 12.8 oz (99.7 kg)   LMP  (LMP Unknown)   SpO2 97%   BMI 40.19 kg/m   Wt Readings from Last 3 Encounters:  03/14/22 219 lb 12.8 oz (99.7 kg)  12/19/21 220 lb 3.2 oz (99.9 kg)  06/20/21 221 lb (100.2 kg)    Physical Exam Vitals and nursing note reviewed.  Constitutional:      General: She is not in acute distress.    Appearance: Normal appearance. She is obese. She is not ill-appearing, toxic-appearing or diaphoretic.  HENT:      Head: Normocephalic and atraumatic.     Right Ear: External ear normal.     Left Ear: External ear normal.     Nose: Nose normal.     Mouth/Throat:     Mouth: Mucous membranes are moist.     Pharynx: Oropharynx is clear.  Eyes:     General: No scleral icterus.       Right eye: No discharge.        Left eye: No discharge.     Extraocular Movements: Extraocular movements intact.     Conjunctiva/sclera: Conjunctivae normal.     Pupils: Pupils are equal, round, and reactive to light.  Cardiovascular:     Rate and Rhythm: Normal rate and regular rhythm.     Pulses: Normal pulses.     Heart sounds: Normal heart sounds. No murmur heard.   No friction rub. No gallop.  Pulmonary:     Effort: Pulmonary effort is normal. No respiratory distress.     Breath sounds: Normal breath sounds. No stridor. No wheezing, rhonchi or rales.  Chest:     Chest wall: No tenderness.  Musculoskeletal:        General: Normal range of motion.     Cervical back: Normal range of motion and neck supple.  Skin:  General: Skin is warm and dry.     Capillary Refill: Capillary refill takes less than 2 seconds.     Coloration: Skin is not jaundiced or pale.     Findings: No bruising, erythema, lesion or rash.  Neurological:     General: No focal deficit present.     Mental Status: She is alert and oriented to person, place, and time. Mental status is at baseline.  Psychiatric:        Mood and Affect: Mood normal.        Behavior: Behavior normal.        Thought Content: Thought content normal.        Judgment: Judgment normal.    Results for orders placed or performed in visit on 12/19/21  Comprehensive metabolic panel  Result Value Ref Range   Glucose 88 70 - 99 mg/dL   BUN 16 6 - 24 mg/dL   Creatinine, Ser 0.64 0.57 - 1.00 mg/dL   eGFR 108 >59 mL/min/1.73   BUN/Creatinine Ratio 25 (H) 9 - 23   Sodium 143 134 - 144 mmol/L   Potassium 5.1 3.5 - 5.2 mmol/L   Chloride 107 (H) 96 - 106 mmol/L   CO2  22 20 - 29 mmol/L   Calcium 9.4 8.7 - 10.2 mg/dL   Total Protein 7.0 6.0 - 8.5 g/dL   Albumin 4.6 3.8 - 4.8 g/dL   Globulin, Total 2.4 1.5 - 4.5 g/dL   Albumin/Globulin Ratio 1.9 1.2 - 2.2   Bilirubin Total 0.2 0.0 - 1.2 mg/dL   Alkaline Phosphatase 61 44 - 121 IU/L   AST 21 0 - 40 IU/L   ALT 16 0 - 32 IU/L  CBC with Differential/Platelet  Result Value Ref Range   WBC 6.4 3.4 - 10.8 x10E3/uL   RBC 4.64 3.77 - 5.28 x10E6/uL   Hemoglobin 10.9 (L) 11.1 - 15.9 g/dL   Hematocrit 35.5 34.0 - 46.6 %   MCV 77 (L) 79 - 97 fL   MCH 23.5 (L) 26.6 - 33.0 pg   MCHC 30.7 (L) 31.5 - 35.7 g/dL   RDW 13.2 11.7 - 15.4 %   Platelets 365 150 - 450 x10E3/uL   Neutrophils 66 Not Estab. %   Lymphs 22 Not Estab. %   Monocytes 8 Not Estab. %   Eos 3 Not Estab. %   Basos 1 Not Estab. %   Neutrophils Absolute 4.3 1.4 - 7.0 x10E3/uL   Lymphocytes Absolute 1.4 0.7 - 3.1 x10E3/uL   Monocytes Absolute 0.5 0.1 - 0.9 x10E3/uL   EOS (ABSOLUTE) 0.2 0.0 - 0.4 x10E3/uL   Basophils Absolute 0.0 0.0 - 0.2 x10E3/uL   Immature Granulocytes 0 Not Estab. %   Immature Grans (Abs) 0.0 0.0 - 0.1 x10E3/uL  TSH  Result Value Ref Range   TSH 0.964 0.450 - 4.500 uIU/mL  VITAMIN D 25 Hydroxy (Vit-D Deficiency, Fractures)  Result Value Ref Range   Vit D, 25-Hydroxy 36.1 30.0 - 100.0 ng/mL      Assessment & Plan:   Problem List Items Addressed This Visit       Other   Environmental allergies - Primary    Will check RAST. Had allergy shots in the past before. Will get her back into allergy. Referral generated today. Continue singulair, xyzal and flonase. Call with any concerns.        Relevant Orders   Perennial allergen profile IgE   Ambulatory referral to Allergy   Allergens w/Comp Rflx Area 2  Other Visit Diagnoses     Screening for tuberculosis       Quantiferon drawn today.   Relevant Orders   QuantiFERON-TB Gold Plus        Follow up plan: Return End of August, for [facility].

## 2022-03-14 NOTE — Assessment & Plan Note (Signed)
Will check RAST. Had allergy shots in the past before. Will get her back into allergy. Referral generated today. Continue singulair, xyzal and flonase. Call with any concerns.

## 2022-03-19 LAB — ALLERGEN PROFILE, PERENNIAL ALLERGEN IGE
Aureobasidi Pullulans IgE: <0.1 kU/L
Candida Albicans IgE: <0.1 kU/L
Chicken Feathers IgE: <0.1 kU/L
Cow Dander IgE: <0.1 kU/L
Duck Feathers IgE: <0.1 kU/L
Goose Feathers IgE: <0.1 kU/L
Mucor Racemosus IgE: <0.1 kU/L
Phoma Betae IgE: <0.1 kU/L
Setomelanomma Rostrat: <0.1 kU/L
Stemphylium Herbarum IgE: <0.1 kU/L

## 2022-03-19 LAB — ALLERGENS W/COMP RFLX AREA 2
Alternaria Alternata IgE: <0.1 kU/L
Aspergillus Fumigatus IgE: <0.1 kU/L
Bermuda Grass IgE: <0.1 kU/L
Cedar, Mountain IgE: <0.1 kU/L
Cladosporium Herbarum IgE: <0.1 kU/L
Cockroach, German IgE: <0.1 kU/L
Common Silver Birch IgE: <0.1 kU/L
Cottonwood IgE: <0.1 kU/L
D Farinae IgE: <0.1 kU/L
D Pteronyssinus IgE: <0.1 kU/L
E001-IgE Cat Dander: <0.1 kU/L
E005-IgE Dog Dander: <0.1 kU/L
Elm, American IgE: <0.1 kU/L
IgE (Immunoglobulin E), Serum: 42 IU/mL (ref 6–495)
Johnson Grass IgE: <0.1 kU/L
Maple/Box Elder IgE: <0.1 kU/L
Mouse Urine IgE: <0.1 kU/L
Oak, White IgE: <0.1 kU/L
Pecan, Hickory IgE: <0.1 kU/L
Penicillium Chrysogen IgE: <0.1 kU/L
Pigweed, Rough IgE: <0.1 kU/L
Ragweed, Short IgE: <0.1 kU/L
Sheep Sorrel IgE Qn: <0.1 kU/L
Timothy Grass IgE: <0.1 kU/L
White Mulberry IgE: <0.1 kU/L

## 2022-03-19 LAB — QUANTIFERON-TB GOLD PLUS
QuantiFERON Mitogen Value: >10 IU/mL
QuantiFERON Nil Value: 0 IU/mL
QuantiFERON TB1 Ag Value: 0 IU/mL
QuantiFERON TB2 Ag Value: 0 IU/mL
QuantiFERON-TB Gold Plus: NEGATIVE

## 2022-03-21 ENCOUNTER — Encounter: Payer: Self-pay | Admitting: Family Medicine

## 2022-03-25 ENCOUNTER — Other Ambulatory Visit: Payer: Self-pay | Admitting: Physician Assistant

## 2022-03-25 DIAGNOSIS — R519 Headache, unspecified: Secondary | ICD-10-CM

## 2022-03-25 DIAGNOSIS — M542 Cervicalgia: Secondary | ICD-10-CM

## 2022-05-29 ENCOUNTER — Telehealth: Payer: Self-pay

## 2022-05-29 NOTE — Telephone Encounter (Signed)
Copied from CRM [id number]. Topic: General - Other >> May 29, 2022 11:24 AM Lyman Speller wrote: Reason for CRM: Pt is interested in Gene sight testing/ they are calling to see if this is something Dr. Laural Benes is willing to offer this testing to the pt / please advise to have this set up

## 2022-05-30 NOTE — Telephone Encounter (Signed)
Patient is aware of provider advise, states she already sees psychiatry at [facility] and will ask them. No further questions.

## 2022-05-30 NOTE — Telephone Encounter (Signed)
I do not do genesight testing. If she would like to do it I'm happy to refer her to psychiatry who can do that.

## 2022-06-09 ENCOUNTER — Telehealth: Payer: Self-pay | Admitting: Family Medicine

## 2022-06-09 NOTE — Telephone Encounter (Signed)
[name] from [facility] called to report that the patient is requesting Gene Sight Testing, wants the pcp to get registered to provide testing.  Best contact:  [phone]

## 2022-06-12 NOTE — Telephone Encounter (Signed)
Advised Nancy Sanchez Dr. Laural Benes does not do Gene Sight Testing and patient was aware. Patient was advised to contact psychiatrist for that testing.

## 2022-06-19 ENCOUNTER — Encounter: Payer: Managed Care, Other (non HMO) | Admitting: Family Medicine

## 2022-07-11 ENCOUNTER — Ambulatory Visit (INDEPENDENT_AMBULATORY_CARE_PROVIDER_SITE_OTHER): Payer: Commercial Managed Care - HMO | Admitting: Family Medicine

## 2022-07-11 ENCOUNTER — Encounter: Payer: Self-pay | Admitting: Family Medicine

## 2022-07-11 VITALS — BP 98/61 | HR 69 | Temp 97.9°F | Ht 64.0 in | Wt 216.4 lb

## 2022-07-11 DIAGNOSIS — I471 Supraventricular tachycardia, unspecified: Secondary | ICD-10-CM

## 2022-07-11 DIAGNOSIS — Z0000 Encounter for general adult medical examination without abnormal findings: Secondary | ICD-10-CM

## 2022-07-11 DIAGNOSIS — J45909 Unspecified asthma, uncomplicated: Secondary | ICD-10-CM

## 2022-07-11 DIAGNOSIS — H9193 Unspecified hearing loss, bilateral: Secondary | ICD-10-CM

## 2022-07-11 DIAGNOSIS — Z9884 Bariatric surgery status: Secondary | ICD-10-CM

## 2022-07-11 DIAGNOSIS — E559 Vitamin D deficiency, unspecified: Secondary | ICD-10-CM | POA: Diagnosis not present

## 2022-07-11 DIAGNOSIS — Z1231 Encounter for screening mammogram for malignant neoplasm of breast: Secondary | ICD-10-CM

## 2022-07-11 DIAGNOSIS — K219 Gastro-esophageal reflux disease without esophagitis: Secondary | ICD-10-CM

## 2022-07-11 DIAGNOSIS — F330 Major depressive disorder, recurrent, mild: Secondary | ICD-10-CM

## 2022-07-11 DIAGNOSIS — G43709 Chronic migraine without aura, not intractable, without status migrainosus: Secondary | ICD-10-CM

## 2022-07-11 DIAGNOSIS — I10 Essential (primary) hypertension: Secondary | ICD-10-CM | POA: Diagnosis not present

## 2022-07-11 DIAGNOSIS — F419 Anxiety disorder, unspecified: Secondary | ICD-10-CM

## 2022-07-11 DIAGNOSIS — D508 Other iron deficiency anemias: Secondary | ICD-10-CM

## 2022-07-11 LAB — URINALYSIS, ROUTINE W REFLEX MICROSCOPIC
Bilirubin, UA: NEGATIVE
Glucose, UA: NEGATIVE
Ketones, UA: NEGATIVE
Leukocytes,UA: NEGATIVE
Nitrite, UA: NEGATIVE
Protein,UA: NEGATIVE
RBC, UA: NEGATIVE
Specific Gravity, UA: 1.01 (ref 1.005–1.030)
Urobilinogen, Ur: 0.2 mg/dL (ref 0.2–1.0)
pH, UA: 7 (ref 5.0–7.5)

## 2022-07-11 LAB — MICROALBUMIN, URINE WAIVED
Creatinine, Urine Waived: 50 mg/dL (ref 10–300)
Microalb, Ur Waived: 10 mg/L (ref 0–19)
Microalb/Creat Ratio: <30 mg/g (ref <30)

## 2022-07-11 MED ORDER — IPRATROPIUM BROMIDE 0.03 % NA SOLN: 2.0000 | Freq: Two times a day (BID) | NASAL | 2 refills | Status: DC

## 2022-07-11 MED ORDER — ALBUTEROL SULFATE (2.5 MG/3ML) 0.083% IN NEBU: 2.5000 mg | INHALATION_SOLUTION | Freq: Four times a day (QID) | RESPIRATORY_TRACT | 6 refills | Status: DC | PRN

## 2022-07-11 MED ORDER — BUDESONIDE-FORMOTEROL FUMARATE 160-4.5 MCG/ACT IN AERO: 2.0000 | INHALATION_SPRAY | Freq: Two times a day (BID) | RESPIRATORY_TRACT | 3 refills | Status: DC

## 2022-07-11 MED ORDER — ONDANSETRON 4 MG PO TBDP: 4.0000 mg | ORAL_TABLET | Freq: Three times a day (TID) | ORAL | 0 refills | Status: DC | PRN

## 2022-07-11 MED ORDER — ALBUTEROL SULFATE HFA 108 (90 BASE) MCG/ACT IN AERS: INHALATION_SPRAY | RESPIRATORY_TRACT | 6 refills | Status: DC

## 2022-07-11 MED ORDER — GABAPENTIN 300 MG PO CAPS: 600.0000 mg | ORAL_CAPSULE | Freq: Two times a day (BID) | ORAL | 1 refills | Status: DC

## 2022-07-11 MED ORDER — LEVOCETIRIZINE DIHYDROCHLORIDE 5 MG PO TABS: 5.0000 mg | ORAL_TABLET | Freq: Every evening | ORAL | 2 refills | Status: DC

## 2022-07-11 MED ORDER — RIZATRIPTAN BENZOATE 10 MG PO TABS: ORAL_TABLET | ORAL | 12 refills | Status: DC

## 2022-07-11 MED ORDER — MONTELUKAST SODIUM 10 MG PO TABS: 10.0000 mg | ORAL_TABLET | Freq: Every day | ORAL | 1 refills | Status: DC

## 2022-07-11 MED ORDER — FEXOFENADINE HCL 180 MG PO TABS: 180.0000 mg | ORAL_TABLET | Freq: Every day | ORAL | 3 refills | Status: DC

## 2022-07-11 MED ORDER — DICLOFENAC SODIUM 1 % EX GEL: 4.0000 g | Freq: Four times a day (QID) | CUTANEOUS | 0 refills | Status: DC

## 2022-07-11 NOTE — Assessment & Plan Note (Signed)
Under good control on current regimen. Continue current regimen. Continue to monitor. Call with any concerns. Refills given.   

## 2022-07-11 NOTE — Assessment & Plan Note (Signed)
Continue to follow with psychiatry. Call with any concerns.  ?

## 2022-07-11 NOTE — Assessment & Plan Note (Signed)
Rechecking labs today. Await results. Treat as needed.  °

## 2022-07-11 NOTE — Progress Notes (Signed)
BP 98/61   Pulse 69   Temp 97.9 F (36.6 C)   Ht  (1.626 m)   Wt 216 lb 6.4 oz (98.2 kg)   SpO2 100%   BMI 37.14 kg/m    Subjective:    Patient ID: Nancy Sanchez, female    DOB: 07-29-1971, 51 y.o.   MRN: [id number]  HPI: Nancy Sanchez is a 51 y.o. female presenting on 07/11/2022 for comprehensive medical examination. Current medical complaints include:  HYPERTENSION  Hypertension status: controlled  Satisfied with current treatment? yes Duration of hypertension: chronic BP monitoring frequency:  not checking BP medication side effects:  no Medication compliance: excellent compliance Previous BP meds:propranolol Aspirin: no Recurrent headaches: yes Visual changes: no Palpitations: no Dyspnea: no Chest pain: no Lower extremity edema: no Dizzy/lightheaded: no  ASTHMA Asthma status: controlled Satisfied with current treatment?: yes Albuterol/rescue inhaler frequency: occasionally Dyspnea frequency: occasionally Wheezing frequency: occasionally Cough frequency: rarely Nocturnal symptom frequency: never  Limitation of activity: no Current upper respiratory symptoms: no Aerochamber/spacer use: no Visits to ER or Urgent Care in past year: no Pneumovax: Up to Date Influenza:  declined  Still working with her psychiatrist on mood.   Has been having a lot of issues with ortho.   Continues to follow with neurology for her migraines. Going to start botox injections.  She currently lives with:alone Menopausal Symptoms: no  Depression Screen done today and results listed below:     07/11/2022    9:00 AM 03/14/2022    9:16 AM 12/19/2021    8:15 AM 06/20/2021    9:00 AM 12/20/2020    9:31 AM  Depression screen PHQ 2/9  Decreased Interest Down, Depressed, Hopeless 0  PHQ - 2 Score Altered sleeping 0  Tired, decreased energy Change in appetite 0  Feeling bad or failure about yourself  0 0 0  0 0  Trouble concentrating Moving slowly or fidgety/restless 0 0 1 0 0  Suicidal thoughts 0 0 0 0 0  PHQ-9 Score Difficult doing work/chores    Somewhat difficult Not difficult at all    Past Medical History:  Past Medical History:  Diagnosis Date   Abscess of abdominal wall 11/06/2017   Abscess of right foot 07/18/2017   ADHD    Allergy    Anemia    Anxiety    Arthritis    Asthma    Barrett's esophagus    Bulging lumbar disc    L5-S1   Depression    Endometriosis    GERD (gastroesophageal reflux disease)    Hernia, hiatal    DDD   HPV in female    Hypertension    Infection of skin due to methicillin resistant Staphylococcus aureus (MRSA) 11/09/2017   Migraine    Osteoporosis     Surgical History:  Past Surgical History:  Procedure Laterality Date   ARTHROSCOPIC REPAIR ACL Left    CARPAL TUNNEL RELEASE Bilateral    ESSURE TUBAL LIGATION     GASTRIC BYPASS     HEEL SPUR EXCISION Left    INGUINAL HERNIA REPAIR  12/18/2017   JOINT REPLACEMENT Left    knee   SHOULDER ARTHROSCOPY W/ LABRAL REPAIR  03/2020   TONSILLECTOMY     TOTAL  HIP ARTHROPLASTY  09/28/2020    Medications:  Current Outpatient Medications on File Prior to Visit  Medication Sig   acetaminophen (TYLENOL) 650 MG CR tablet Take by mouth.   ascorbic acid (VITAMIN C) 500 MG tablet Take by mouth.   Biotin 1 MG CAPS Take by mouth.   buPROPion (WELLBUTRIN XL) 150 MG 24 hr tablet Take 150 mg by mouth every morning.   buPROPion (WELLBUTRIN XL) 300 MG 24 hr tablet Take 1 tablet (300 mg total) by mouth daily.   busPIRone (BUSPAR) 5 MG tablet Take 1 tablet (5 mg total) by mouth as needed (Take twice a day as needed for increased anxiety.).   calcium carbonate (OSCAL) 1500 (600 Ca) MG TABS tablet Take by mouth daily.    Calcium-Magnesium-Zinc 333-133-5 MG TABS Take 1 tablet by mouth daily.   cyclobenzaprine (FLEXERIL) 5 MG tablet Take 1 tablet (5 mg total) by mouth 2 (two) times daily  as needed for muscle spasms.   FLUoxetine (PROZAC) 20 MG capsule Take 20 mg by mouth daily.   fluticasone (CUTIVATE) 0.005 % ointment Apply topically as needed.    fluticasone (FLONASE) 50 MCG/ACT nasal spray Place into the nose as needed.    folic acid (FOLVITE) 1 MG tablet Take 1 mg by mouth daily.   magnesium oxide (MAG-OX) 400 MG tablet Take 400 mg by mouth 2 (two) times daily.   meclizine (ANTIVERT) 25 MG tablet Take by mouth as needed.    mupirocin ointment (BACTROBAN) 2 % Place 1 application into the nose 2 (two) times daily.   nystatin (MYCOSTATIN/NYSTOP) powder Apply topically 3 (three) times daily.   nystatin cream (MYCOSTATIN) Apply 1 application topically 2 (two) times daily.   Omega-3 1000 MG CAPS Take by mouth.   omeprazole (PRILOSEC) 40 MG capsule Take 1 capsule (40 mg total) by mouth 2 (two) times daily before a meal.   propranolol (INDERAL) 40 MG tablet Take 1 tablet (40 mg total) by mouth 2 (two) times daily.   QUEtiapine (SEROQUEL) 25 MG tablet Take 25 mg by mouth at bedtime.   silver sulfADIAZINE (SILVADENE) 1 % cream Apply topically.   TURMERIC PO Take by mouth daily.    UNABLE TO FIND Take by mouth daily. Part cherry capsule   ferrous sulfate 324 (65 Fe) MG TBEC Take by mouth daily. (Patient not taking: Reported on 07/11/2022)   Misc Natural Products (IN-FLA-MEND) CAPS Take by mouth. (Patient not taking: Reported on 07/11/2022)   No current facility-administered medications on file prior to visit.    Allergies:  Allergies  Allergen Reactions   Metronidazole Hives, Itching and Rash   Oxycodone-Acetaminophen Nausea And Vomiting    Other reaction(s): Nausea And Vomiting nausea nausea   Prednisone Anxiety    Other reaction(s): Other (See Comments) "agitated"   Tape Rash   Aspirin     Unable to take aspirin products since RNY Gastric Bypass   Codeine     Other reaction(s): Other (See Comments)   Nickel Itching    Itching from earrings -unsure of composition    Nsaids Other (See Comments)    Gastric bypass   Other Itching    Itching from earrings -unsure of composition   Oxycodone Other (See Comments)    Other reaction(s): Vomiting   Fluticasone-Salmeterol Anxiety    Other Reaction: jittery Other Reaction: jittery Other Reaction: jittery Other Reaction: jittery Other Reaction: jittery   Latex Rash    Other Reaction: Rash on hands only Other Reaction: Rash on hands  only   Morphine Anxiety and Itching    Does well with dilaudid and toradol Does well with dilaudid and toradol    Social History:  Social History   Socioeconomic History   Marital status: Single    Spouse name: Not on file   Number of children: Not on file   Years of education: Not on file   Highest education level: Not on file  Occupational History   Not on file  Tobacco Use   Smoking status: Former    Types: Cigarettes    Quit date: 10/23/1998    Years since quitting: 23.7   Smokeless tobacco: Never  Vaping Use   Vaping Use: Never used  Substance and Sexual Activity   Alcohol use: No   Drug use: No   Sexual activity: Yes    Birth control/protection: Surgical  Other Topics Concern   Not on file  Social History Narrative   Not on file   Social Determinants of Health   Financial Resource Strain: Not on file  Food Insecurity: Not on file  Transportation Needs: Not on file  Physical Activity: Not on file  Stress: Not on file  Social Connections: Not on file  Intimate Partner Violence: Not on file   Social History   Tobacco Use  Smoking Status Former   Types: Cigarettes   Quit date: 10/23/1998   Years since quitting: 23.7  Smokeless Tobacco Never   Social History   Substance and Sexual Activity  Alcohol Use No    Family History:  Family History  Adopted: Yes  Problem Relation Age of Onset   Heart disease Father    Breast cancer Neg Hx     Past medical history, surgical history, medications, allergies, family history and social history  reviewed with patient today and changes made to appropriate areas of the chart.   Review of Systems  Constitutional: Negative.   HENT:  Positive for hearing loss. Negative for congestion, ear discharge, ear pain, nosebleeds, sinus pain, sore throat and tinnitus.   Eyes: Negative.   Respiratory: Negative.  Negative for stridor.   Cardiovascular:  Positive for palpitations. Negative for chest pain, orthopnea, claudication, leg swelling and PND.  Gastrointestinal:  Positive for heartburn. Negative for abdominal pain, blood in stool, constipation, diarrhea, melena, nausea and vomiting.  Genitourinary: Negative.   Musculoskeletal:  Positive for back pain, joint pain, myalgias and neck pain. Negative for falls.  Skin: Negative.   Neurological: Negative.   Endo/Heme/Allergies: Negative.   Psychiatric/Behavioral:  Positive for depression. Negative for hallucinations, memory loss, substance abuse and suicidal ideas. The patient is nervous/anxious. The patient does not have insomnia.    All other ROS negative except what is listed above and in the HPI.      Objective:    BP 98/61   Pulse 69   Temp 97.9 F (36.6 C)   Ht 5\' 4"  (1.626 m)   Wt 216 lb 6.4 oz (98.2 kg)   SpO2 100%   BMI 37.14 kg/m   Wt Readings from Last 3 Encounters:  07/11/22 216 lb 6.4 oz (98.2 kg)  03/14/22 219 lb 12.8 oz (99.7 kg)  12/19/21 220 lb 3.2 oz (99.9 kg)    Physical Exam Vitals and nursing note reviewed.  Constitutional:      General: She is not in acute distress.    Appearance: Normal appearance. She is obese. She is not ill-appearing, toxic-appearing or diaphoretic.  HENT:     Head: Normocephalic and atraumatic.  Right Ear: Tympanic membrane, ear canal and external ear normal. There is no impacted cerumen.     Left Ear: Tympanic membrane, ear canal and external ear normal. There is no impacted cerumen.     Nose: Nose normal. No congestion or rhinorrhea.     Mouth/Throat:     Mouth: Mucous membranes  are moist.     Pharynx: Oropharynx is clear. No oropharyngeal exudate or posterior oropharyngeal erythema.  Eyes:     General: No scleral icterus.       Right eye: No discharge.        Left eye: No discharge.     Extraocular Movements: Extraocular movements intact.     Conjunctiva/sclera: Conjunctivae normal.     Pupils: Pupils are equal, round, and reactive to light.  Neck:     Vascular: No carotid bruit.  Cardiovascular:     Rate and Rhythm: Normal rate and regular rhythm.     Pulses: Normal pulses.     Heart sounds: No murmur heard.    No friction rub. No gallop.  Pulmonary:     Effort: Pulmonary effort is normal. No respiratory distress.     Breath sounds: Normal breath sounds. No stridor. No wheezing, rhonchi or rales.  Chest:     Chest wall: No tenderness.  Abdominal:     General: Abdomen is flat. Bowel sounds are normal. There is no distension.     Palpations: Abdomen is soft. There is no mass.     Tenderness: There is no abdominal tenderness. There is no right CVA tenderness, left CVA tenderness, guarding or rebound.     Hernia: No hernia is present.  Genitourinary:    Comments: Breast and pelvic exams deferred with shared decision making Musculoskeletal:        General: No swelling, tenderness, deformity or signs of injury.     Cervical back: Normal range of motion and neck supple. No rigidity. No muscular tenderness.     Right lower leg: No edema.     Left lower leg: No edema.  Lymphadenopathy:     Cervical: No cervical adenopathy.  Skin:    General: Skin is warm and dry.     Capillary Refill: Capillary refill takes less than 2 seconds.     Coloration: Skin is not jaundiced or pale.     Findings: No bruising, erythema, lesion or rash.  Neurological:     General: No focal deficit present.     Mental Status: She is alert and oriented to person, place, and time. Mental status is at baseline.     Cranial Nerves: No cranial nerve deficit.     Sensory: No sensory  deficit.     Motor: No weakness.     Coordination: Coordination normal.     Gait: Gait normal.     Deep Tendon Reflexes: Reflexes normal.  Psychiatric:        Mood and Affect: Mood normal.        Behavior: Behavior normal.        Thought Content: Thought content normal.        Judgment: Judgment normal.     Results for orders placed or performed in visit on 07/11/22  Urinalysis, Routine w reflex microscopic  Result Value Ref Range   Specific Gravity, UA 1.010 1.005 - 1.030   pH, UA 7.0 5.0 - 7.5   Color, UA Yellow Yellow   Appearance Ur Clear Clear   Leukocytes,UA Negative Negative   Protein,UA Negative Negative/Trace  Glucose, UA Negative Negative   Ketones, UA Negative Negative   RBC, UA Negative Negative   Bilirubin, UA Negative Negative   Urobilinogen, Ur 0.2 0.2 - 1.0 mg/dL   Nitrite, UA Negative Negative  Microalbumin, Urine Waived  Result Value Ref Range   Microalb, Ur Waived 10 0 - 19 mg/L   Creatinine, Urine Waived 50 10 - 300 mg/dL   Microalb/Creat Ratio <30 <30 mg/g      Assessment & Plan:   Problem List Items Addressed This Visit       Cardiovascular and Mediastinum   Paroxysmal supraventricular tachycardia (HCC)    Under good control on current regimen. Continue current regimen. Continue to monitor. Call with any concerns. Refills given.        Hypertension    Under good control on current regimen. Continue current regimen. Continue to monitor. Call with any concerns. Refills given.        Relevant Orders   Microalbumin, Urine Waived (Completed)   Migraines    Continue to follow with neurology. Call with any concerns.       Relevant Medications   FLUoxetine (PROZAC) 20 MG capsule   gabapentin (NEURONTIN) 300 MG capsule   rizatriptan (MAXALT) 10 MG tablet     Respiratory   Asthma    Under good control on current regimen. Continue current regimen. Continue to monitor. Call with any concerns. Refills given.        Relevant Medications    albuterol (PROVENTIL) (2.5 MG/3ML) 0.083% nebulizer solution   albuterol (VENTOLIN HFA) 108 (90 Base) MCG/ACT inhaler   budesonide-formoterol (SYMBICORT) 160-4.5 MCG/ACT inhaler   montelukast (SINGULAIR) 10 MG tablet     Digestive   GERD (gastroesophageal reflux disease)    Under good control on current regimen. Continue current regimen. Continue to monitor. Call with any concerns. Refills given.        Relevant Medications   ondansetron (ZOFRAN ODT) 4 MG disintegrating tablet     Other   Iron deficiency anemia    Rechecking labs today. Await results. Treat as needed.       Relevant Medications   folic acid (FOLVITE) 1 MG tablet   Other Relevant Orders   Iron Binding Cap (TIBC)([facility])   Ferritin   Anxiety    Continue to follow with psychiatry. Call with any concerns.       Relevant Medications   FLUoxetine (PROZAC) 20 MG capsule   History of Roux-en-Y gastric bypass    Rechecking labs today. Await results. Treat as needed.       Relevant Orders   B12   Vitamin K1, Serum   Vitamin D deficiency    Rechecking labs today. Await results. Treat as needed.       Relevant Orders   VITAMIN D 25 Hydroxy (Vit-D Deficiency, Fractures)   Mild episode of recurrent major depressive disorder (HCC)    Continue to follow with psychiatry. Call with any concerns.       Relevant Medications   FLUoxetine (PROZAC) 20 MG capsule   Morbid obesity (HCC)    Encouraged diet and exercise with goal of losing 1-2lbs per week.       Other Visit Diagnoses     Routine general medical examination at a health care facility    -  Primary   Vaccines up to date/declined. Screening labs checked today. Pap UTD. Colonoscopy done in 2017. Mammo due in Oct. Continue diet and exercise. Call with concerns.   Relevant Orders  CBC with Differential/Platelet   Comprehensive metabolic panel   Lipid Panel w/o Chol/HDL Ratio   Urinalysis, Routine w reflex microscopic (Completed)   TSH    Bilateral hearing loss, unspecified hearing loss type       Referral to audiology placed today.   Relevant Orders   Ambulatory referral to Audiology   Encounter for screening mammogram for malignant neoplasm of breast       Mammo ordered today.   Relevant Orders   MM 3D SCREEN BREAST BILATERAL        Follow up plan: Return in about 6 months (around 01/09/2023).   LABORATORY TESTING:  - Pap smear: up to date  IMMUNIZATIONS:   - Tdap: Tetanus vaccination status reviewed: last tetanus booster within 10 years. - Influenza: Refused - Pneumovax: Up to date - Prevnar: Not applicable - COVID: Refused - HPV: Not applicable - Shingrix vaccine: Up to date  SCREENING: -Mammogram: Ordered today  - Colonoscopy: Up to date   PATIENT COUNSELING:   Advised to take 1 mg of folate supplement per day if capable of pregnancy.   Sexuality: Discussed sexually transmitted diseases, partner selection, use of condoms, avoidance of unintended pregnancy  and contraceptive alternatives.   Advised to avoid cigarette smoking.  I discussed with the patient that most people either abstain from alcohol or drink within safe limits (<=14/week and <=4 drinks/occasion for males, <=7/weeks and <= 3 drinks/occasion for females) and that the risk for alcohol disorders and other health effects rises proportionally with the number of drinks per week and how often a drinker exceeds daily limits.  Discussed cessation/primary prevention of drug use and availability of treatment for abuse.   Diet: Encouraged to adjust caloric intake to maintain  or achieve ideal body weight, to reduce intake of dietary saturated fat and total fat, to limit sodium intake by avoiding high sodium foods and not adding table salt, and to maintain adequate dietary potassium and calcium preferably from fresh fruits, vegetables, and low-fat dairy products.    stressed the importance of regular exercise  Injury prevention: Discussed safety  belts, safety helmets, smoke detector, smoking near bedding or upholstery.   Dental health: Discussed importance of regular tooth brushing, flossing, and dental visits.    NEXT PREVENTATIVE PHYSICAL DUE IN 1 YEAR. Return in about 6 months (around 01/09/2023).

## 2022-07-11 NOTE — Assessment & Plan Note (Signed)
Encouraged diet and exercise with goal of losing 1-2lbs per week.  

## 2022-07-11 NOTE — Assessment & Plan Note (Signed)
Continue to follow with neurology. Call with any concerns.  

## 2022-07-12 LAB — CBC WITH DIFFERENTIAL/PLATELET
Basophils Absolute: 0 10*3/uL (ref 0.0–0.2)
Basos: 1 %
EOS (ABSOLUTE): 0.1 10*3/uL (ref 0.0–0.4)
Eos: 2 %
Hematocrit: 43 % (ref 34.0–46.6)
Hemoglobin: 13.4 g/dL (ref 11.1–15.9)
Immature Grans (Abs): 0 10*3/uL (ref 0.0–0.1)
Immature Granulocytes: 0 %
Lymphocytes Absolute: 1.4 10*3/uL (ref 0.7–3.1)
Lymphs: 22 %
MCH: 27 pg (ref 26.6–33.0)
MCHC: 31.2 g/dL — ABNORMAL LOW (ref 31.5–35.7)
MCV: 87 fL (ref 79–97)
Monocytes Absolute: 0.5 10*3/uL (ref 0.1–0.9)
Monocytes: 7 %
Neutrophils Absolute: 4.2 10*3/uL (ref 1.4–7.0)
Neutrophils: 68 %
Platelets: 278 10*3/uL (ref 150–450)
RBC: 4.97 x10E6/uL (ref 3.77–5.28)
RDW: 16.1 % — ABNORMAL HIGH (ref 11.7–15.4)
WBC: 6.2 10*3/uL (ref 3.4–10.8)

## 2022-07-12 LAB — IRON AND TIBC
Iron Saturation: 22 % (ref 15–55)
Iron: 75 ug/dL (ref 27–159)
Total Iron Binding Capacity: 337 ug/dL (ref 250–450)
UIBC: 262 ug/dL (ref 131–425)

## 2022-07-12 LAB — COMPREHENSIVE METABOLIC PANEL
ALT: 15 IU/L (ref 0–32)
AST: 14 IU/L (ref 0–40)
Albumin/Globulin Ratio: 2.2 (ref 1.2–2.2)
Albumin: 4.4 g/dL (ref 3.8–4.9)
Alkaline Phosphatase: 86 IU/L (ref 44–121)
BUN/Creatinine Ratio: 39 — ABNORMAL HIGH (ref 9–23)
BUN: 21 mg/dL (ref 6–24)
Bilirubin Total: 0.2 mg/dL (ref 0.0–1.2)
CO2: 25 mmol/L (ref 20–29)
Calcium: 9.4 mg/dL (ref 8.7–10.2)
Chloride: 101 mmol/L (ref 96–106)
Creatinine, Ser: 0.54 mg/dL — ABNORMAL LOW (ref 0.57–1.00)
Globulin, Total: 2 g/dL (ref 1.5–4.5)
Glucose: 76 mg/dL (ref 70–99)
Potassium: 5.6 mmol/L — ABNORMAL HIGH (ref 3.5–5.2)
Sodium: 138 mmol/L (ref 134–144)
Total Protein: 6.4 g/dL (ref 6.0–8.5)
eGFR: 111 mL/min/{1.73_m2} (ref >59)

## 2022-07-12 LAB — LIPID PANEL W/O CHOL/HDL RATIO
Cholesterol, Total: 179 mg/dL (ref 100–199)
HDL: 50 mg/dL (ref >39)
LDL Chol Calc (NIH): 104 mg/dL — ABNORMAL HIGH (ref 0–99)
Triglycerides: 145 mg/dL (ref 0–149)
VLDL Cholesterol Cal: 25 mg/dL (ref 5–40)

## 2022-07-12 LAB — TSH: TSH: 0.993 u[IU]/mL (ref 0.450–4.500)

## 2022-07-12 LAB — FERRITIN: Ferritin: 48 ng/mL (ref 15–150)

## 2022-07-12 LAB — VITAMIN B12: Vitamin B-12: 513 pg/mL (ref 232–1245)

## 2022-07-12 LAB — VITAMIN D 25 HYDROXY (VIT D DEFICIENCY, FRACTURES): Vit D, 25-Hydroxy: 27.4 ng/mL — ABNORMAL LOW (ref 30.0–100.0)

## 2022-07-14 ENCOUNTER — Encounter: Payer: Self-pay | Admitting: Family Medicine

## 2022-07-15 LAB — VITAMIN K1, SERUM: VITAMIN K1: 0.35 ng/mL (ref 0.10–2.20)

## 2022-07-17 ENCOUNTER — Telehealth: Payer: Self-pay

## 2022-07-17 NOTE — Telephone Encounter (Signed)
-----   Message from Valerie Roys, [place] sent at 07/11/2022  9:23 AM EDT ----- 10/13/2016- colonoscopy done   [facility] Encounter  [place]  [address]  [place], [place] [address]  [place], [place], [place]  [place]  [place], [place] [address]  [phone] (Work)  [phone] (Fax)

## 2022-07-18 ENCOUNTER — Other Ambulatory Visit: Payer: Self-pay

## 2022-07-18 MED ORDER — ONDANSETRON 4 MG PO TBDP: 4.0000 mg | ORAL_TABLET | Freq: Three times a day (TID) | ORAL | 0 refills | Status: DC | PRN

## 2022-07-18 MED ORDER — LEVOCETIRIZINE DIHYDROCHLORIDE 5 MG PO TABS: 5.0000 mg | ORAL_TABLET | Freq: Every evening | ORAL | 2 refills | Status: DC

## 2022-07-18 MED ORDER — GABAPENTIN 300 MG PO CAPS: 600.0000 mg | ORAL_CAPSULE | Freq: Two times a day (BID) | ORAL | 1 refills | Status: DC

## 2022-07-18 MED ORDER — MONTELUKAST SODIUM 10 MG PO TABS: 10.0000 mg | ORAL_TABLET | Freq: Every day | ORAL | 1 refills | Status: DC

## 2022-07-18 MED ORDER — IPRATROPIUM BROMIDE 0.03 % NA SOLN: 2.0000 | Freq: Two times a day (BID) | NASAL | 2 refills | Status: DC

## 2022-07-18 MED ORDER — RIZATRIPTAN BENZOATE 10 MG PO TABS: ORAL_TABLET | ORAL | 12 refills | Status: DC

## 2022-07-18 MED ORDER — ALBUTEROL SULFATE HFA 108 (90 BASE) MCG/ACT IN AERS: INHALATION_SPRAY | RESPIRATORY_TRACT | 6 refills | Status: DC

## 2022-07-18 NOTE — Telephone Encounter (Signed)
Patient is requesting prescriptions be sent to [facility], will be using them as a pharmacy now.

## 2022-07-19 NOTE — Telephone Encounter (Signed)
Called and LVM with [facility] to please return my call for report.

## 2022-07-24 ENCOUNTER — Other Ambulatory Visit: Payer: Self-pay | Admitting: Family Medicine

## 2022-07-24 NOTE — Telephone Encounter (Signed)
Medication Refill - Medication: ondansetron (ZOFRAN ODT) 4 MG disintegrating tablet  Has the patient contacted their pharmacy? Yes.   Pharmacy called to request Rx  Preferred Pharmacy (with phone number or street name):  [facility] - [address], [place] Phone:  [phone]  Fax:  [phone]     Has the patient been seen for an appointment in the last year OR does the patient have an upcoming appointment? Yes.    Agent: Please be advised that RX refills may take up to 3 business days. We ask that you follow-up with your pharmacy.

## 2022-07-25 NOTE — Telephone Encounter (Signed)
Requested medication (s) are due for refill today - provider review   Requested medication (s) are on the active medication list -yes  Future visit scheduled -yes  Last refill: 07/18/22 #20  Notes to clinic: non delegated Rx  Requested Prescriptions  Pending Prescriptions Disp Refills   ondansetron (ZOFRAN ODT) 4 MG disintegrating tablet 20 tablet 0    Sig: Take 1 tablet (4 mg total) by mouth every 8 (eight) hours as needed for nausea or vomiting.     Not Delegated - Gastroenterology: Antiemetics - ondansetron Failed - 07/24/2022  5:17 PM      Failed - This refill cannot be delegated      Passed - AST in normal range and within 360 days    AST  Date Value Ref Range Status  07/11/2022 14 0 - 40 IU/L Final         Passed - ALT in normal range and within 360 days    ALT  Date Value Ref Range Status  07/11/2022 15 0 - 32 IU/L Final         Passed - Valid encounter within last 6 months    Recent Outpatient Visits           2 weeks ago Routine general medical examination at a health care facility   [facility], [place], DO   4 months ago Environmental allergies   [place], [place], DO   7 months ago Primary hypertension   [place], [place], DO   1 year ago Routine general medical examination at a health care facility   [facility], [place], DO   1 year ago Primary hypertension   [facility] [place], [place], DO       Future Appointments             In 5 months [place], Megan P, DO [place], [facility]               Requested Prescriptions  Pending Prescriptions Disp Refills   ondansetron (ZOFRAN ODT) 4 MG disintegrating tablet 20 tablet 0    Sig: Take 1 tablet (4 mg total) by mouth every 8 (eight) hours as needed for nausea or vomiting.     Not Delegated - Gastroenterology: Antiemetics - ondansetron Failed - 07/24/2022  5:17 PM      Failed - This  refill cannot be delegated      Passed - AST in normal range and within 360 days    AST  Date Value Ref Range Status  07/11/2022 14 0 - 40 IU/L Final         Passed - ALT in normal range and within 360 days    ALT  Date Value Ref Range Status  07/11/2022 15 0 - 32 IU/L Final         Passed - Valid encounter within last 6 months    Recent Outpatient Visits           2 weeks ago Routine general medical examination at a health care facility   [facility], [place], DO   4 months ago Environmental allergies   [place], [place], DO   7 months ago Primary hypertension   [place], [place], DO   1 year ago Routine general medical examination at a health care facility   [facility], [place], DO   1  year ago Primary hypertension   [facility] [place], [place], DO       Future Appointments             In 5 months Laural Benes, Oralia Rud, DO [facility], [facility]

## 2022-08-29 LAB — HM MAMMOGRAPHY

## 2022-09-05 ENCOUNTER — Other Ambulatory Visit: Payer: Self-pay | Admitting: Physical Medicine & Rehabilitation

## 2022-09-05 DIAGNOSIS — M5442 Lumbago with sciatica, left side: Secondary | ICD-10-CM

## 2022-09-12 ENCOUNTER — Ambulatory Visit: Admission: RE | Admit: 2022-09-12 | Payer: Commercial Managed Care - HMO | Source: Ambulatory Visit

## 2022-10-17 ENCOUNTER — Ambulatory Visit: Payer: Self-pay

## 2022-10-17 ENCOUNTER — Telehealth: Payer: Self-pay | Admitting: Family Medicine

## 2022-10-17 ENCOUNTER — Encounter: Payer: Self-pay | Admitting: Family Medicine

## 2022-10-17 ENCOUNTER — Telehealth: Payer: Commercial Managed Care - HMO | Admitting: Family Medicine

## 2022-10-17 DIAGNOSIS — J4541 Moderate persistent asthma with (acute) exacerbation: Secondary | ICD-10-CM

## 2022-10-17 DIAGNOSIS — J208 Acute bronchitis due to other specified organisms: Secondary | ICD-10-CM | POA: Diagnosis not present

## 2022-10-17 DIAGNOSIS — B9689 Other specified bacterial agents as the cause of diseases classified elsewhere: Secondary | ICD-10-CM | POA: Diagnosis not present

## 2022-10-17 MED ORDER — BENZONATATE 100 MG PO CAPS: 100.0000 mg | ORAL_CAPSULE | Freq: Three times a day (TID) | ORAL | 0 refills | Status: DC | PRN

## 2022-10-17 MED ORDER — AMOXICILLIN-POT CLAVULANATE 875-125 MG PO TABS: 1.0000 | ORAL_TABLET | Freq: Two times a day (BID) | ORAL | 0 refills | Status: AC

## 2022-10-17 MED ORDER — PSEUDOEPH-BROMPHEN-DM 30-2-10 MG/5ML PO SYRP: 5.0000 mL | ORAL_SOLUTION | Freq: Four times a day (QID) | ORAL | 0 refills | Status: DC | PRN

## 2022-10-17 MED ORDER — PREDNISONE 20 MG PO TABS: 40.0000 mg | ORAL_TABLET | Freq: Every day | ORAL | 0 refills | Status: AC

## 2022-10-17 NOTE — Patient Instructions (Addendum)
Nancy Sanchez, thank you for joining Perlie Mayo, NP for today's virtual visit.  While this provider is not your primary care provider (PCP), if your PCP is located in our provider database this encounter information will be shared with them immediately following your visit.   [place] account gives you access to today's visit and all your visits, tests, and labs performed at [facility] " click here if you don't have a [place] account or go to [email or web address]  Consent: (Patient) Nancy Sanchez provided verbal consent for this virtual visit at the beginning of the encounter.  Current Medications:  Current Outpatient Medications:    [START ON 10/19/2022] amoxicillin-clavulanate (AUGMENTIN) 875-125 MG tablet, Take 1 tablet by mouth 2 (two) times daily for 7 days., Disp: 14 tablet, Rfl: 0   benzonatate (TESSALON) 100 MG capsule, Take 1 capsule (100 mg total) by mouth 3 (three) times daily as needed for cough., Disp: 20 capsule, Rfl: 0   brompheniramine-pseudoephedrine-DM 30-2-10 MG/5ML syrup, Take 5 mLs by mouth 4 (four) times daily as needed., Disp: 120 mL, Rfl: 0   predniSONE (DELTASONE) 20 MG tablet, Take 2 tablets (40 mg total) by mouth daily with breakfast for 5 days., Disp: 10 tablet, Rfl: 0   acetaminophen (TYLENOL) 650 MG CR tablet, Take by mouth., Disp: , Rfl:    albuterol (PROVENTIL) (2.5 MG/3ML) 0.083% nebulizer solution, Take 3 mLs (2.5 mg total) by nebulization every 6 (six) hours as needed for wheezing or shortness of breath., Disp: 150 mL, Rfl: 6   albuterol (VENTOLIN HFA) 108 (90 Base) MCG/ACT inhaler, 1-2 puffs by inhalation 4 times a day when needed, Disp: 18 g, Rfl: 6   ascorbic acid (VITAMIN C) 500 MG tablet, Take by mouth., Disp: , Rfl:    Biotin 1 MG CAPS, Take by mouth., Disp: , Rfl:    budesonide-formoterol (SYMBICORT) 160-4.5 MCG/ACT inhaler, Inhale 2 puffs into the lungs 2 (two) times daily., Disp: 10.2 g, Rfl:  3   buPROPion (WELLBUTRIN XL) 150 MG 24 hr tablet, Take 150 mg by mouth every morning., Disp: , Rfl:    buPROPion (WELLBUTRIN XL) 300 MG 24 hr tablet, Take 1 tablet (300 mg total) by mouth daily., Disp: 90 tablet, Rfl: 3   busPIRone (BUSPAR) 5 MG tablet, Take 1 tablet (5 mg total) by mouth as needed (Take twice a day as needed for increased anxiety.)., Disp: 60 tablet, Rfl: 1   calcium carbonate (OSCAL) 1500 (600 Ca) MG TABS tablet, Take by mouth daily. , Disp: , Rfl:    Calcium-Magnesium-Zinc 333-133-5 MG TABS, Take 1 tablet by mouth daily., Disp: , Rfl:    cyclobenzaprine (FLEXERIL) 5 MG tablet, Take 1 tablet (5 mg total) by mouth 2 (two) times daily as needed for muscle spasms., Disp: 180 tablet, Rfl: 1   diclofenac Sodium (VOLTAREN) 1 % GEL, Apply 4 g topically 4 (four) times daily., Disp: 100 g, Rfl: 0   ferrous sulfate 324 (65 Fe) MG TBEC, Take by mouth daily. (Patient not taking: Reported on 07/11/2022), Disp: , Rfl:    fexofenadine (ALLEGRA) 180 MG tablet, Take 1 tablet (180 mg total) by mouth daily., Disp: 90 tablet, Rfl: 3   FLUoxetine (PROZAC) 20 MG capsule, Take 20 mg by mouth daily., Disp: , Rfl:    fluticasone (CUTIVATE) 0.005 % ointment, Apply topically as needed. , Disp: , Rfl:    fluticasone (FLONASE) 50 MCG/ACT nasal spray, Place into the nose as needed. , Disp: , Rfl:  folic acid (FOLVITE) 1 MG tablet, Take 1 mg by mouth daily., Disp: , Rfl:    gabapentin (NEURONTIN) 300 MG capsule, Take 2 capsules (600 mg total) by mouth 2 (two) times daily., Disp: 360 capsule, Rfl: 1   ipratropium (ATROVENT) 0.03 % nasal spray, Place 2 sprays into both nostrils every 12 (twelve) hours., Disp: 30 mL, Rfl: 2   levocetirizine (XYZAL) 5 MG tablet, Take 1 tablet (5 mg total) by mouth every evening., Disp: 30 tablet, Rfl: 2   magnesium oxide (MAG-OX) 400 MG tablet, Take 400 mg by mouth 2 (two) times daily., Disp: , Rfl:    meclizine (ANTIVERT) 25 MG tablet, Take by mouth as needed. , Disp: , Rfl:     Misc Natural Products (IN-FLA-MEND) CAPS, Take by mouth. (Patient not taking: Reported on 07/11/2022), Disp: , Rfl:    montelukast (SINGULAIR) 10 MG tablet, Take 1 tablet (10 mg total) by mouth daily., Disp: 90 tablet, Rfl: 1   mupirocin ointment (BACTROBAN) 2 %, Place 1 application into the nose 2 (two) times daily., Disp: 22 g, Rfl: 0   nystatin (MYCOSTATIN/NYSTOP) powder, Apply topically 3 (three) times daily., Disp: 90 g, Rfl: 5   nystatin cream (MYCOSTATIN), Apply 1 application topically 2 (two) times daily., Disp: 90 g, Rfl: 5   Omega-3 1000 MG CAPS, Take by mouth., Disp: , Rfl:    omeprazole (PRILOSEC) 40 MG capsule, Take 1 capsule (40 mg total) by mouth 2 (two) times daily before a meal., Disp: 180 capsule, Rfl: 1   ondansetron (ZOFRAN ODT) 4 MG disintegrating tablet, Take 1 tablet (4 mg total) by mouth every 8 (eight) hours as needed for nausea or vomiting., Disp: 20 tablet, Rfl: 0   propranolol (INDERAL) 40 MG tablet, Take 1 tablet (40 mg total) by mouth 2 (two) times daily., Disp: 180 tablet, Rfl: 1   QUEtiapine (SEROQUEL) 25 MG tablet, Take 25 mg by mouth at bedtime., Disp: , Rfl:    rizatriptan (MAXALT) 10 MG tablet, TAKE ONE TABLET BY MOUTH AT ONSET OF HEADACHE; MAY REPEAT ONE TABLET IN 2 HOURS IF NEEDED., Disp: 8 tablet, Rfl: 12   silver sulfADIAZINE (SILVADENE) 1 % cream, Apply topically., Disp: , Rfl:    TURMERIC PO, Take by mouth daily. , Disp: , Rfl:    UNABLE TO FIND, Take by mouth daily. Part cherry capsule, Disp: , Rfl:    Medications ordered in this encounter:  Meds ordered this encounter  Medications   predniSONE (DELTASONE) 20 MG tablet    Sig: Take 2 tablets (40 mg total) by mouth daily with breakfast for 5 days.    Dispense:  10 tablet    Refill:  0    Order Specific Question:   Supervising Provider    Answer:   Merrilee Jansky [[id number]]   benzonatate (TESSALON) 100 MG capsule    Sig: Take 1 capsule (100 mg total) by mouth 3 (three) times daily as needed for  cough.    Dispense:  20 capsule    Refill:  0    Order Specific Question:   Supervising Provider    Answer:   Merrilee Jansky [id number]   brompheniramine-pseudoephedrine-DM 30-2-10 MG/5ML syrup    Sig: Take 5 mLs by mouth 4 (four) times daily as needed.    Dispense:  120 mL    Refill:  0    Order Specific Question:   Supervising Provider    Answer:   Merrilee Jansky [id number]   amoxicillin-clavulanate (AUGMENTIN) 875-125  MG tablet    Sig: Take 1 tablet by mouth 2 (two) times daily for 7 days.    Dispense:  14 tablet    Refill:  0    Order Specific Question:   Supervising Provider    Answer:   Chase Picket [id number]     *If you need refills on other medications prior to your next appointment, please contact your pharmacy*  Follow-Up: Call back or seek an in-person evaluation if the symptoms worsen or if the condition fails to improve as anticipated.  [facility] [phone]  Other Instructions  -Covid test- to rule this out - Take meds as prescribed - Rest voice - Use a cool mist humidifier especially during the winter months when heat dries out the air. - Use saline nose sprays frequently to help soothe nasal passages if they are drying out. - Stay hydrated by drinking plenty of fluids - Keep thermostat turn down low to prevent drying out which can cause a dry cough. - For any cough or congestion- robitussin DM or Delsym as needed - For fever or aches or pains- take tylenol or ibuprofen as directed on bottle             * for fevers greater than 101 orally you may alternate ibuprofen and tylenol every 3 hours.  If you do not improve you will need a follow up visit in person.                  If you have been instructed to have an in-person evaluation today at a local Urgent Care facility, please use the link below. It will take you to a list of all of our available [facility], including address, phone number and hours of operation.  Please do not delay care.  [facility]  If you or a family member do not have a primary care provider, use the link below to schedule a visit and establish care. When you choose a [facility] primary care physician or advanced practice provider, you gain a long-term partner in health. Find a Primary Care Provider  Learn more about 's in-office and virtual care options: [facility]

## 2022-10-17 NOTE — Progress Notes (Signed)
Virtual Visit Consent   Nancy Sanchez, you are scheduled for a virtual visit with a [facility] provider today. Just as with appointments in the office, your consent must be obtained to participate. Your consent will be active for this visit and any virtual visit you may have with one of our providers in the next 365 days. If you have a MyChart account, a copy of this consent can be sent to you electronically. As this is a virtual visit, video technology does not allow for your provider to perform a traditional examination. This may limit your provider's ability to fully assess your condition. If your provider identifies any concerns that need to be evaluated in person or the need to arrange testing (such as labs, EKG, etc.), we will make arrangements to do so. Although advances in technology are sophisticated, we cannot ensure that it will always work on either your end or our end. If the connection with a video visit is poor, the visit may have to be switched to a telephone visit. With either a video or telephone visit, we are not always able to ensure that we have a secure connection.  By engaging in this virtual visit, you consent to the provision of healthcare and authorize for your insurance to be billed (if applicable) for the services provided during this visit. Depending on your insurance coverage, you may receive a charge related to this service.  I need to obtain your verbal consent now. Are you willing to proceed with your visit today? Crisel Oltman has provided verbal consent on 10/17/2022 for a virtual visit (video or telephone). Freddy Finner, NP  Date: 10/17/2022 11:29 AM  Virtual Visit via Video Note   I, Freddy Finner, connected with  Lakeesha Campolo  ([id number], Mar 15, 1971) on 10/17/22 at 11:30 AM EST by a video-enabled telemedicine application and verified that I am speaking with the correct person using two identifiers.  Location: Patient: Virtual Visit  Location Patient: Home Provider: Virtual Visit Location Provider: Home Office   I discussed the limitations of evaluation and management by telemedicine and the availability of in person appointments. The patient expressed understanding and agreed to proceed.    History of Present Illness: Zoii Kalin is a 51 y.o. who identifies as a female who was assigned female at birth, and is being seen today for runny nose and asthma flare.  Onset was 6 days ago- shortness of breath increased beyond normal, headache and cough, runny nose, mild wheezing.  Fever was 99.2. Denies chest pain, sore throat, ear pain.  Using inhalers- limited improvement. Has not been using long acting inhaler in some years- needs to speak to PCP about restarting.   Has not tested for COVID.   Problems:  Patient Active Problem List   Diagnosis Date Noted   Environmental allergies 03/14/2022   Impingement syndrome of right shoulder 05/04/2019   Degenerative tear of medial meniscus of right knee 04/08/2019   Morbid obesity (HCC) 01/19/2019   Sprain of calcaneofibular ligament of right ankle 12/12/2018   S/P total knee arthroplasty, left 09/30/2018   Abnormal uterine bleeding 05/16/2018   Barrett's esophagus with dysplasia 04/07/2018   Vitamin D deficiency 03/08/2018   Vaginal low risk HPV DNA test positive 01/26/2018   Left inguinal hernia 11/14/2017   Posterior tibial tendon dysfunction (PTTD) of right lower extremity 11/14/2017   Mild episode of recurrent major depressive disorder (HCC) 06/27/2017   Hidradenitis axillaris 05/28/2017   History of Roux-en-Y gastric  bypass 05/01/2017   Migraines 05/01/2017   Anxiety    Osteoarthritis    Asthma    GERD (gastroesophageal reflux disease)    Hypertension    Osteoporosis    Iron deficiency anemia 02/27/2017   Paroxysmal supraventricular tachycardia 02/27/2017   Seasonal allergic rhinitis 01/04/2016   Primary osteoarthritis of left hip 01/29/2015   Chronic  hip pain 09/24/2014   Primary osteoarthritis of one knee, left 09/24/2014   Edema 06/30/2013   Acquired pes planus of both feet 05/19/2013   Calcific Achilles tendonitis 05/19/2013   Carpal tunnel syndrome 06/14/2012    Allergies:  Allergies  Allergen Reactions   Metronidazole Hives, Itching and Rash   Oxycodone-Acetaminophen Nausea And Vomiting    Other reaction(s): Nausea And Vomiting nausea nausea   Prednisone Anxiety    Other reaction(s): Other (See Comments) "agitated"   Tape Rash   Aspirin     Unable to take aspirin products since RNY Gastric Bypass   Codeine     Other reaction(s): Other (See Comments)   Nickel Itching    Itching from earrings -unsure of composition   Nsaids Other (See Comments)    Gastric bypass   Other Itching    Itching from earrings -unsure of composition   Oxycodone Other (See Comments)    Other reaction(s): Vomiting   Fluticasone-Salmeterol Anxiety    Other Reaction: jittery Other Reaction: jittery Other Reaction: jittery Other Reaction: jittery Other Reaction: jittery   Latex Rash    Other Reaction: Rash on hands only Other Reaction: Rash on hands only   Morphine Anxiety and Itching    Does well with dilaudid and toradol Does well with dilaudid and toradol   Medications:  Current Outpatient Medications:    acetaminophen (TYLENOL) 650 MG CR tablet, Take by mouth., Disp: , Rfl:    albuterol (PROVENTIL) (2.5 MG/3ML) 0.083% nebulizer solution, Take 3 mLs (2.5 mg total) by nebulization every 6 (six) hours as needed for wheezing or shortness of breath., Disp: 150 mL, Rfl: 6   albuterol (VENTOLIN HFA) 108 (90 Base) MCG/ACT inhaler, 1-2 puffs by inhalation 4 times a day when needed, Disp: 18 g, Rfl: 6   ascorbic acid (VITAMIN C) 500 MG tablet, Take by mouth., Disp: , Rfl:    Biotin 1 MG CAPS, Take by mouth., Disp: , Rfl:    budesonide-formoterol (SYMBICORT) 160-4.5 MCG/ACT inhaler, Inhale 2 puffs into the lungs 2 (two) times daily., Disp: 10.2  g, Rfl: 3   buPROPion (WELLBUTRIN XL) 150 MG 24 hr tablet, Take 150 mg by mouth every morning., Disp: , Rfl:    buPROPion (WELLBUTRIN XL) 300 MG 24 hr tablet, Take 1 tablet (300 mg total) by mouth daily., Disp: 90 tablet, Rfl: 3   busPIRone (BUSPAR) 5 MG tablet, Take 1 tablet (5 mg total) by mouth as needed (Take twice a day as needed for increased anxiety.)., Disp: 60 tablet, Rfl: 1   calcium carbonate (OSCAL) 1500 (600 Ca) MG TABS tablet, Take by mouth daily. , Disp: , Rfl:    Calcium-Magnesium-Zinc 333-133-5 MG TABS, Take 1 tablet by mouth daily., Disp: , Rfl:    cyclobenzaprine (FLEXERIL) 5 MG tablet, Take 1 tablet (5 mg total) by mouth 2 (two) times daily as needed for muscle spasms., Disp: 180 tablet, Rfl: 1   diclofenac Sodium (VOLTAREN) 1 % GEL, Apply 4 g topically 4 (four) times daily., Disp: 100 g, Rfl: 0   ferrous sulfate 324 (65 Fe) MG TBEC, Take by mouth daily. (Patient not taking:  Reported on 07/11/2022), Disp: , Rfl:    fexofenadine (ALLEGRA) 180 MG tablet, Take 1 tablet (180 mg total) by mouth daily., Disp: 90 tablet, Rfl: 3   FLUoxetine (PROZAC) 20 MG capsule, Take 20 mg by mouth daily., Disp: , Rfl:    fluticasone (CUTIVATE) 0.005 % ointment, Apply topically as needed. , Disp: , Rfl:    fluticasone (FLONASE) 50 MCG/ACT nasal spray, Place into the nose as needed. , Disp: , Rfl:    folic acid (FOLVITE) 1 MG tablet, Take 1 mg by mouth daily., Disp: , Rfl:    gabapentin (NEURONTIN) 300 MG capsule, Take 2 capsules (600 mg total) by mouth 2 (two) times daily., Disp: 360 capsule, Rfl: 1   ipratropium (ATROVENT) 0.03 % nasal spray, Place 2 sprays into both nostrils every 12 (twelve) hours., Disp: 30 mL, Rfl: 2   levocetirizine (XYZAL) 5 MG tablet, Take 1 tablet (5 mg total) by mouth every evening., Disp: 30 tablet, Rfl: 2   magnesium oxide (MAG-OX) 400 MG tablet, Take 400 mg by mouth 2 (two) times daily., Disp: , Rfl:    meclizine (ANTIVERT) 25 MG tablet, Take by mouth as needed. , Disp: ,  Rfl:    Misc Natural Products (IN-FLA-MEND) CAPS, Take by mouth. (Patient not taking: Reported on 07/11/2022), Disp: , Rfl:    montelukast (SINGULAIR) 10 MG tablet, Take 1 tablet (10 mg total) by mouth daily., Disp: 90 tablet, Rfl: 1   mupirocin ointment (BACTROBAN) 2 %, Place 1 application into the nose 2 (two) times daily., Disp: 22 g, Rfl: 0   nystatin (MYCOSTATIN/NYSTOP) powder, Apply topically 3 (three) times daily., Disp: 90 g, Rfl: 5   nystatin cream (MYCOSTATIN), Apply 1 application topically 2 (two) times daily., Disp: 90 g, Rfl: 5   Omega-3 1000 MG CAPS, Take by mouth., Disp: , Rfl:    omeprazole (PRILOSEC) 40 MG capsule, Take 1 capsule (40 mg total) by mouth 2 (two) times daily before a meal., Disp: 180 capsule, Rfl: 1   ondansetron (ZOFRAN ODT) 4 MG disintegrating tablet, Take 1 tablet (4 mg total) by mouth every 8 (eight) hours as needed for nausea or vomiting., Disp: 20 tablet, Rfl: 0   propranolol (INDERAL) 40 MG tablet, Take 1 tablet (40 mg total) by mouth 2 (two) times daily., Disp: 180 tablet, Rfl: 1   QUEtiapine (SEROQUEL) 25 MG tablet, Take 25 mg by mouth at bedtime., Disp: , Rfl:    rizatriptan (MAXALT) 10 MG tablet, TAKE ONE TABLET BY MOUTH AT ONSET OF HEADACHE; MAY REPEAT ONE TABLET IN 2 HOURS IF NEEDED., Disp: 8 tablet, Rfl: 12   silver sulfADIAZINE (SILVADENE) 1 % cream, Apply topically., Disp: , Rfl:    TURMERIC PO, Take by mouth daily. , Disp: , Rfl:    UNABLE TO FIND, Take by mouth daily. Part cherry capsule, Disp: , Rfl:   Observations/Objective: Patient is well-developed, well-nourished in no acute distress.  Resting comfortably  at home.  Head is normocephalic, atraumatic.  No labored breathing.  Speech is clear and coherent with logical content.  Patient is alert and oriented at baseline.  Cough   Assessment and Plan:   1. Moderate persistent asthma with acute exacerbation  - predniSONE (DELTASONE) 20 MG tablet; Take 2 tablets (40 mg total) by mouth daily  with breakfast for 5 days.  Dispense: 10 tablet; Refill: 0 - brompheniramine-pseudoephedrine-DM 30-2-10 MG/5ML syrup; Take 5 mLs by mouth 4 (four) times daily as needed.  Dispense: 120 mL; Refill: 0  2.  Acute bacterial bronchitis  - predniSONE (DELTASONE) 20 MG tablet; Take 2 tablets (40 mg total) by mouth daily with breakfast for 5 days.  Dispense: 10 tablet; Refill: 0 - benzonatate (TESSALON) 100 MG capsule; Take 1 capsule (100 mg total) by mouth 3 (three) times daily as needed for cough.  Dispense: 20 capsule; Refill: 0 - amoxicillin-clavulanate (AUGMENTIN) 875-125 MG tablet; Take 1 tablet by mouth 2 (two) times daily for 7 days.  Dispense: 14 tablet; Refill: 0  -COVID test encouraged  - Take meds as prescribed - Rest voice - Use a cool mist humidifier especially during the winter months when heat dries out the air. - Use saline nose sprays frequently to help soothe nasal passages if they are drying out. - Stay hydrated by drinking plenty of fluids - Keep thermostat turn down low to prevent drying out which can cause a dry cough. - For any cough or congestion- robitussin DM or Delsym as needed - For fever or aches or pains- take tylenol or ibuprofen as directed on bottle             * for fevers greater than 101 orally you may alternate ibuprofen and tylenol every 3 hours.  If you do not improve you will need a follow up visit in person.                Reviewed side effects, risks and benefits of medication.    Patient acknowledged agreement and understanding of the plan.   Past Medical, Surgical, Social History, Allergies, and Medications have been Reviewed.   Follow Up Instructions: I discussed the assessment and treatment plan with the patient. The patient was provided an opportunity to ask questions and all were answered. The patient agreed with the plan and demonstrated an understanding of the instructions.  A copy of instructions were sent to the patient via MyChart unless  otherwise noted below.     The patient was advised to call back or seek an in-person evaluation if the symptoms worsen or if the condition fails to improve as anticipated.  Time:  I spent 10 minutes with the patient via telehealth technology discussing the above problems/concerns.    Freddy Finner, NP

## 2022-10-17 NOTE — Telephone Encounter (Signed)
Patient advised refill sent to [facility] on 07/11/22 #3 refills and to call them to inquire about that refill. If they did not receive and nothing is on file, call us back to have it sent to [facility], patient verbalized understanding.

## 2022-10-17 NOTE — Telephone Encounter (Signed)
Medication Refill - Medication: budesonide-formoterol (SYMBICORT) 160-4.5 MCG/ACT inhaler   Has the patient contacted their pharmacy? Yes.     Patient has no refills left, was referred to her PCP.    Preferred Pharmacy (with phone number or street name):  [id number] [place], [place] - [address] Phone: [phone]  Fax: [phone]     Has the patient been seen for an appointment in the last year OR does the patient have an upcoming appointment? Yes.    Agent: Please be advised that RX refills may take up to 3 business days. We ask that you follow-up with your pharmacy.

## 2022-10-17 NOTE — Telephone Encounter (Signed)
Chief Complaint: SOB, need refill on inhaler Symbicort Symptoms: Runny nose, low grade fever last night, cough, headache, SOB at rest today, mild wheezing  Frequency: Onset last Wednesday using symbicort after not using for about 3 years Pertinent Negatives: Patient denies chest pain, other symptoms Disposition: [] ED /[] Urgent Care (no appt availability in office) / [] Appointment(In office/virtual)/ [x]  [facility]/ [] Home Care/ [] Refused Recommended Disposition /[] [facility]/ []  Follow-up with PCP Additional Notes: Advised symbicort at the pharmacy since 07/11/22, patient will call when they open up. No availability in the office, scheduled virtual UC visit today at 1130.   Reason for Disposition  [1] MILD difficulty breathing (e.g., minimal/no SOB at rest, SOB with walking, pulse <100) AND [2] NEW-onset or WORSE than normal  Answer Assessment - Initial Assessment Questions 1. RESPIRATORY STATUS: "Describe your breathing?" (e.g., wheezing, shortness of breath, unable to speak, severe coughing)      Shortness of breath, little wheezing 2. ONSET: "When did this breathing problem begin?"      Wednesday of last week 3. PATTERN "Does the difficult breathing come and go, or has it been constant since it started?"      All the time since it started last Wednesday 4. SEVERITY: "How bad is your breathing?" (e.g., mild, moderate, severe)    - MILD: No SOB at rest, mild SOB with walking, speaks normally in sentences, can lie down, no retractions, pulse < 100.    - MODERATE: SOB at rest, SOB with minimal exertion and prefers to sit, cannot lie down flat, speaks in phrases, mild retractions, audible wheezing, pulse 100-120.    - SEVERE: Very SOB at rest, speaks in single words, struggling to breathe, sitting hunched forward, retractions, pulse > 120      Moderate 5. RECURRENT SYMPTOM: "Have you had difficulty breathing before?" If Yes, ask: "When was the last time?" and "What  happened that time?"      Yes 6. LUNG HISTORY: "Do you have any history of lung disease?"  (e.g., pulmonary embolus, asthma, emphysema)     Asthma 7. CAUSE: "What do you think is causing the breathing problem?"      Asthma flare, needs refills on inhaler symbicort 8. OTHER SYMPTOMS: "Do you have any other symptoms? (e.g., dizziness, runny nose, cough, chest pain, fever)     Cough, runny nose, headache, fever last night 99.2  Protocols used: Breathing Difficulty-A-AH

## 2022-11-01 ENCOUNTER — Encounter: Payer: Self-pay | Admitting: Family Medicine

## 2022-11-01 DIAGNOSIS — Z111 Encounter for screening for respiratory tuberculosis: Secondary | ICD-10-CM

## 2022-11-02 ENCOUNTER — Telehealth: Payer: Commercial Managed Care - HMO | Admitting: Nurse Practitioner

## 2022-11-02 DIAGNOSIS — J4541 Moderate persistent asthma with (acute) exacerbation: Secondary | ICD-10-CM | POA: Diagnosis not present

## 2022-11-02 MED ORDER — PSEUDOEPH-BROMPHEN-DM 30-2-10 MG/5ML PO SYRP: 5.0000 mL | ORAL_SOLUTION | Freq: Four times a day (QID) | ORAL | 0 refills | Status: DC | PRN

## 2022-11-02 MED ORDER — BUDESONIDE-FORMOTEROL FUMARATE 160-4.5 MCG/ACT IN AERO: 2.0000 | INHALATION_SPRAY | Freq: Two times a day (BID) | RESPIRATORY_TRACT | 3 refills | Status: AC

## 2022-11-02 MED ORDER — PREDNISONE 10 MG PO TABS: ORAL_TABLET | ORAL | 0 refills | Status: DC

## 2022-11-02 NOTE — Progress Notes (Signed)
Virtual Visit Consent   Nancy Sanchez, you are scheduled for a virtual visit with a [facility] provider today. Just as with appointments in the office, your consent must be obtained to participate. Your consent will be active for this visit and any virtual visit you may have with one of our providers in the next 365 days. If you have a MyChart account, a copy of this consent can be sent to you electronically.  As this is a virtual visit, video technology does not allow for your provider to perform a traditional examination. This may limit your provider's ability to fully assess your condition. If your provider identifies any concerns that need to be evaluated in person or the need to arrange testing (such as labs, EKG, etc.), we will make arrangements to do so. Although advances in technology are sophisticated, we cannot ensure that it will always work on either your end or our end. If the connection with a video visit is poor, the visit may have to be switched to a telephone visit. With either a video or telephone visit, we are not always able to ensure that we have a secure connection.  By engaging in this virtual visit, you consent to the provision of healthcare and authorize for your insurance to be billed (if applicable) for the services provided during this visit. Depending on your insurance coverage, you may receive a charge related to this service.  I need to obtain your verbal consent now. Are you willing to proceed with your visit today? Nancy Sanchez has provided verbal consent on 11/02/2022 for a virtual visit (video or telephone). Claiborne Rigg, NP  Date: 11/02/2022 6:15 PM  Virtual Visit via Video Note   I, Claiborne Rigg, connected with  Nancy Sanchez  ([id number], 09-07-1971) on 11/02/22 at  6:00 PM EST by a video-enabled telemedicine application and verified that I am speaking with the correct person using two identifiers.  Location: Patient: Virtual Visit  Location Patient: Home Provider: Virtual Visit Location Provider: Home Office   I discussed the limitations of evaluation and management by telemedicine and the availability of in person appointments. The patient expressed understanding and agreed to proceed.    History of Present Illness: Nancy Sanchez is a 52 y.o. who identifies as a female who was assigned female at birth, and is being seen today for  persistent asthma symptoms  Nancy Sanchez was evaluated via virtual visit on 12-26 for asthma exacerbation. At that time she was treated with augmentin, tessalon and prednisone for complaints of  6 days onset of shortness of breath increased beyond normal, headache,  productive cough, runny nose and mild wheezing.  Tmax 99.2. She had been using inhalers with limited improvement. Today she states her symptoms are still persistent ( mainly cough) COVID negative. She is using her SABA inhaler and nebs as well as dayquil. Cough is productive and keeping her up at night. Denies fever, shortness of breath or chest pain.     Problems:  Patient Active Problem List   Diagnosis Date Noted   Environmental allergies 03/14/2022   Impingement syndrome of right shoulder 05/04/2019   Degenerative tear of medial meniscus of right knee 04/08/2019   Morbid obesity (HCC) 01/19/2019   Sprain of calcaneofibular ligament of right ankle 12/12/2018   S/P total knee arthroplasty, left 09/30/2018   Abnormal uterine bleeding 05/16/2018   Barrett's esophagus with dysplasia 04/07/2018   Vitamin D deficiency 03/08/2018   Vaginal low risk HPV  DNA test positive 01/26/2018   Left inguinal hernia 11/14/2017   Posterior tibial tendon dysfunction (PTTD) of right lower extremity 11/14/2017   Mild episode of recurrent major depressive disorder ([place]) 06/27/2017   Hidradenitis axillaris 05/28/2017   History of Roux-en-Y gastric bypass 05/01/2017   Migraines 05/01/2017   Anxiety    Osteoarthritis    Asthma    GERD  (gastroesophageal reflux disease)    Hypertension    Osteoporosis    Iron deficiency anemia 02/27/2017   Paroxysmal supraventricular tachycardia 02/27/2017   Seasonal allergic rhinitis 01/04/2016   Primary osteoarthritis of left hip 01/29/2015   Chronic hip pain 09/24/2014   Primary osteoarthritis of one knee, left 09/24/2014   Edema 06/30/2013   Acquired pes planus of both feet 05/19/2013   Calcific Achilles tendonitis 05/19/2013   Carpal tunnel syndrome 06/14/2012    Allergies:  Allergies  Allergen Reactions   Metronidazole Hives, Itching and Rash   Oxycodone-Acetaminophen Nausea And Vomiting    Other reaction(s): Nausea And Vomiting nausea nausea   Prednisone Anxiety    Other reaction(s): Other (See Comments) "agitated"   Tape Rash   Aspirin     Unable to take aspirin products since RNY Gastric Bypass   Codeine     Other reaction(s): Other (See Comments)   Nickel Itching    Itching from earrings -unsure of composition   Nsaids Other (See Comments)    Gastric bypass   Other Itching    Itching from earrings -unsure of composition   Oxycodone Other (See Comments)    Other reaction(s): Vomiting   Fluticasone-Salmeterol Anxiety    Other Reaction: jittery Other Reaction: jittery Other Reaction: jittery Other Reaction: jittery Other Reaction: jittery   Latex Rash    Other Reaction: Rash on hands only Other Reaction: Rash on hands only   Morphine Anxiety and Itching    Does well with dilaudid and toradol Does well with dilaudid and toradol   Medications:  Current Outpatient Medications:    acetaminophen (TYLENOL) 650 MG CR tablet, Take by mouth., Disp: , Rfl:    albuterol (PROVENTIL) (2.5 MG/3ML) 0.083% nebulizer solution, Take 3 mLs (2.5 mg total) by nebulization every 6 (six) hours as needed for wheezing or shortness of breath., Disp: 150 mL, Rfl: 6   albuterol (VENTOLIN HFA) 108 (90 Base) MCG/ACT inhaler, 1-2 puffs by inhalation 4 times a day when needed, Disp: 18  g, Rfl: 6   ascorbic acid (VITAMIN C) 500 MG tablet, Take by mouth., Disp: , Rfl:    benzonatate (TESSALON) 100 MG capsule, Take 1 capsule (100 mg total) by mouth 3 (three) times daily as needed for cough., Disp: 20 capsule, Rfl: 0   Biotin 1 MG CAPS, Take by mouth., Disp: , Rfl:    brompheniramine-pseudoephedrine-DM 30-2-10 MG/5ML syrup, Take 5 mLs by mouth 4 (four) times daily as needed., Disp: 240 mL, Rfl: 0   budesonide-formoterol (SYMBICORT) 160-4.5 MCG/ACT inhaler, Inhale 2 puffs into the lungs 2 (two) times daily., Disp: 10.2 g, Rfl: 3   buPROPion (WELLBUTRIN XL) 150 MG 24 hr tablet, Take 150 mg by mouth every morning., Disp: , Rfl:    buPROPion (WELLBUTRIN XL) 300 MG 24 hr tablet, Take 1 tablet (300 mg total) by mouth daily., Disp: 90 tablet, Rfl: 3   busPIRone (BUSPAR) 5 MG tablet, Take 1 tablet (5 mg total) by mouth as needed (Take twice a day as needed for increased anxiety.)., Disp: 60 tablet, Rfl: 1   calcium carbonate (OSCAL) 1500 (600 Ca)  MG TABS tablet, Take by mouth daily. , Disp: , Rfl:    Calcium-Magnesium-Zinc 333-133-5 MG TABS, Take 1 tablet by mouth daily., Disp: , Rfl:    cyclobenzaprine (FLEXERIL) 5 MG tablet, Take 1 tablet (5 mg total) by mouth 2 (two) times daily as needed for muscle spasms., Disp: 180 tablet, Rfl: 1   diclofenac Sodium (VOLTAREN) 1 % GEL, Apply 4 g topically 4 (four) times daily., Disp: 100 g, Rfl: 0   ferrous sulfate 324 (65 Fe) MG TBEC, Take by mouth daily. (Patient not taking: Reported on 07/11/2022), Disp: , Rfl:    fexofenadine (ALLEGRA) 180 MG tablet, Take 1 tablet (180 mg total) by mouth daily., Disp: 90 tablet, Rfl: 3   FLUoxetine (PROZAC) 20 MG capsule, Take 20 mg by mouth daily., Disp: , Rfl:    fluticasone (CUTIVATE) 0.005 % ointment, Apply topically as needed. , Disp: , Rfl:    fluticasone (FLONASE) 50 MCG/ACT nasal spray, Place into the nose as needed. , Disp: , Rfl:    folic acid (FOLVITE) 1 MG tablet, Take 1 mg by mouth daily., Disp: , Rfl:     gabapentin (NEURONTIN) 300 MG capsule, Take 2 capsules (600 mg total) by mouth 2 (two) times daily., Disp: 360 capsule, Rfl: 1   ipratropium (ATROVENT) 0.03 % nasal spray, Place 2 sprays into both nostrils every 12 (twelve) hours., Disp: 30 mL, Rfl: 2   levocetirizine (XYZAL) 5 MG tablet, Take 1 tablet (5 mg total) by mouth every evening., Disp: 30 tablet, Rfl: 2   magnesium oxide (MAG-OX) 400 MG tablet, Take 400 mg by mouth 2 (two) times daily., Disp: , Rfl:    meclizine (ANTIVERT) 25 MG tablet, Take by mouth as needed. , Disp: , Rfl:    Misc Natural Products (IN-FLA-MEND) CAPS, Take by mouth. (Patient not taking: Reported on 07/11/2022), Disp: , Rfl:    montelukast (SINGULAIR) 10 MG tablet, Take 1 tablet (10 mg total) by mouth daily., Disp: 90 tablet, Rfl: 1   mupirocin ointment (BACTROBAN) 2 %, Place 1 application into the nose 2 (two) times daily., Disp: 22 g, Rfl: 0   nystatin (MYCOSTATIN/NYSTOP) powder, Apply topically 3 (three) times daily., Disp: 90 g, Rfl: 5   nystatin cream (MYCOSTATIN), Apply 1 application topically 2 (two) times daily., Disp: 90 g, Rfl: 5   Omega-3 1000 MG CAPS, Take by mouth., Disp: , Rfl:    omeprazole (PRILOSEC) 40 MG capsule, Take 1 capsule (40 mg total) by mouth 2 (two) times daily before a meal., Disp: 180 capsule, Rfl: 1   ondansetron (ZOFRAN ODT) 4 MG disintegrating tablet, Take 1 tablet (4 mg total) by mouth every 8 (eight) hours as needed for nausea or vomiting., Disp: 20 tablet, Rfl: 0   predniSONE (DELTASONE) 10 MG tablet, Directions for 6 day taper: Day 1: 2 tablets before breakfast, 1 after both lunch & dinner and 2 at bedtime Day 2: 1 tab before breakfast, 1 after both lunch & dinner and 2 at bedtime Day 3: 1 tab at each meal & 1 at bedtime Day 4: 1 tab at breakfast, 1 at lunch, 1 at bedtime Day 5: 1 tab at breakfast & 1 tab at bedtime Day 6: 1 tab at breakfast, Disp: 21 tablet, Rfl: 0   propranolol (INDERAL) 40 MG tablet, Take 1 tablet (40 mg total) by  mouth 2 (two) times daily., Disp: 180 tablet, Rfl: 1   QUEtiapine (SEROQUEL) 25 MG tablet, Take 25 mg by mouth at bedtime., Disp: , Rfl:  rizatriptan (MAXALT) 10 MG tablet, TAKE ONE TABLET BY MOUTH AT ONSET OF HEADACHE; MAY REPEAT ONE TABLET IN 2 HOURS IF NEEDED., Disp: 8 tablet, Rfl: 12   silver sulfADIAZINE (SILVADENE) 1 % cream, Apply topically., Disp: , Rfl:    TURMERIC PO, Take by mouth daily. , Disp: , Rfl:    UNABLE TO FIND, Take by mouth daily. Part cherry capsule, Disp: , Rfl:   Observations/Objective: Patient is well-developed, well-nourished in no acute distress.  Resting comfortably at home.  Head is normocephalic, atraumatic.  No labored breathing.  Speech is clear and coherent with logical content.  Patient is alert and oriented at baseline.    Assessment and Plan: 1. Moderate persistent asthma with acute exacerbation - brompheniramine-pseudoephedrine-DM 30-2-10 MG/5ML syrup; Take 5 mLs by mouth 4 (four) times daily as needed.  Dispense: 240 mL; Refill: 0 - predniSONE (DELTASONE) 10 MG tablet; Directions for 6 day taper: Day 1: 2 tablets before breakfast, 1 after both lunch & dinner and 2 at bedtime Day 2: 1 tab before breakfast, 1 after both lunch & dinner and 2 at bedtime Day 3: 1 tab at each meal & 1 at bedtime Day 4: 1 tab at breakfast, 1 at lunch, 1 at bedtime Day 5: 1 tab at breakfast & 1 tab at bedtime Day 6: 1 tab at breakfast  Dispense: 21 tablet; Refill: 0 - budesonide-formoterol (SYMBICORT) 160-4.5 MCG/ACT inhaler; Inhale 2 puffs into the lungs 2 (two) times daily.  Dispense: 10.2 g; Refill: 3    Follow Up Instructions: I discussed the assessment and treatment plan with the patient. The patient was provided an opportunity to ask questions and all were answered. The patient agreed with the plan and demonstrated an understanding of the instructions.  A copy of instructions were sent to the patient via MyChart unless otherwise noted below.    The patient was  advised to call back or seek an in-person evaluation if the symptoms worsen or if the condition fails to improve as anticipated.  Time:  I spent 12 minutes with the patient via telehealth technology discussing the above problems/concerns.    Gildardo Pounds, NP

## 2022-11-03 NOTE — Telephone Encounter (Signed)
Order in.

## 2022-11-06 ENCOUNTER — Other Ambulatory Visit: Payer: Self-pay | Admitting: Family Medicine

## 2022-11-06 NOTE — Telephone Encounter (Signed)
Requested Prescriptions  Pending Prescriptions Disp Refills   omeprazole (PRILOSEC) 40 MG capsule [Pharmacy Med Name: Omeprazole Oral Capsule Delayed Release 40 MG] 180 capsule 0    Sig: TAKE ONE CAPSULE BY MOUTH TWICE DAILY BEFORE A MEAL     Gastroenterology: Proton Pump Inhibitors Passed - 11/06/2022  9:47 AM      Passed - Valid encounter within last 12 months    Recent Outpatient Visits           3 months ago Routine general medical examination at a health care facility   [facility], [place], DO   7 months ago Environmental allergies   [place], [place], DO   10 months ago Primary hypertension   [place], [place], DO   1 year ago Routine general medical examination at a health care facility   [place], [place], DO   1 year ago Primary hypertension   [facility] Valerie Roys, DO       Future Appointments             In 2 months Wynetta Emery, Barb Merino, DO [facility], [place]

## 2022-11-09 ENCOUNTER — Other Ambulatory Visit: Payer: Commercial Managed Care - HMO

## 2022-11-09 DIAGNOSIS — Z111 Encounter for screening for respiratory tuberculosis: Secondary | ICD-10-CM

## 2022-11-13 LAB — QUANTIFERON-TB GOLD PLUS
QuantiFERON Mitogen Value: 1.59 IU/mL
QuantiFERON Nil Value: 0 IU/mL
QuantiFERON TB1 Ag Value: 0 IU/mL
QuantiFERON TB2 Ag Value: 0 IU/mL
QuantiFERON-TB Gold Plus: NEGATIVE

## 2022-12-25 ENCOUNTER — Telehealth: Payer: Self-pay

## 2022-12-25 NOTE — Transitions of Care (Post Inpatient/ED Visit) (Signed)
   12/25/2022  Name: Nancy Sanchez MRN: [id number] DOB: [birth year]  Today's TOC FU Call Status: Today's TOC FU Call Status:: Unsuccessul Call (1st Attempt) Unsuccessful Call (1st Attempt) Date: 12/25/22  Attempted to reach the patient regarding the most recent Inpatient/ED visit.  Follow Up Plan: Additional outreach attempts will be made to reach the patient to complete the Transitions of Care (Post Inpatient/ED visit) call.   Signature Irena Reichmann, [place]

## 2022-12-25 NOTE — Transitions of Care (Post Inpatient/ED Visit) (Signed)
   12/25/2022  Name: Nancy Sanchez MRN: :[id number] DOB: Jul 10, 1971  Today's TOC FU Call Status: Today's TOC FU Call Status:: Successful TOC FU Call Competed Unsuccessful Call (1st Attempt) Date: 12/25/22 [facility] FU Call Complete Date: 12/25/22  Transition Care Management Follow-up Telephone Call Date of Discharge: 12/20/22 Discharge Facility: [facility]) Type of Discharge: Emergency Department How have you been since you were released from the hospital?: Better Any questions or concerns?: No  Items Reviewed: Did you receive and understand the discharge instructions provided?: Yes Medications obtained and verified?: Yes (Medications Reviewed) Any new allergies since your discharge?: No Dietary orders reviewed?: NA Do you have support at home?: No  Home Care and Equipment/Supplies: [place] Ordered?: NA Any new equipment or medical supplies ordered?: NA  Functional Questionnaire: Do you need assistance with bathing/showering or dressing?: No Do you need assistance with meal preparation?: No Do you need assistance with eating?: No Do you have difficulty maintaining continence: No Do you need assistance with getting out of bed/getting out of a chair/moving?: No Do you have difficulty managing or taking your medications?: No  Folllow up appointments reviewed: PCP Follow-up appointment confirmed?: Yes Date of PCP follow-up appointment?: 01/09/23 Follow-up Provider: Dr Jinny Blossom [facility] Follow-up appointment confirmed?: Yes Follow-Up Specialty Provider:: Neurology Do you need transportation to your follow-up appointment?: No Do you understand care options if your condition(s) worsen?: Yes-patient verbalized understanding    [place], [place]

## 2022-12-25 NOTE — Telephone Encounter (Signed)
-----   Message from Georgina Peer, [place] sent at 12/22/2022  1:28 PM EST ----- Needs TOC completed please.

## 2022-12-29 ENCOUNTER — Other Ambulatory Visit: Payer: Self-pay | Admitting: Family Medicine

## 2022-12-29 MED ORDER — ONDANSETRON 4 MG PO TBDP: 4.0000 mg | ORAL_TABLET | Freq: Three times a day (TID) | ORAL | 0 refills | Status: DC | PRN

## 2023-01-04 ENCOUNTER — Inpatient Hospital Stay: Payer: Commercial Managed Care - HMO | Admitting: Family Medicine

## 2023-01-09 ENCOUNTER — Ambulatory Visit: Payer: Commercial Managed Care - HMO | Admitting: Family Medicine

## 2023-01-18 HISTORY — PX: TOTAL ABDOMINAL HYSTERECTOMY: SHX209

## 2023-01-25 ENCOUNTER — Ambulatory Visit: Payer: Commercial Managed Care - HMO | Admitting: Family Medicine

## 2023-02-13 ENCOUNTER — Ambulatory Visit: Payer: Commercial Managed Care - HMO | Admitting: Family Medicine

## 2023-02-26 ENCOUNTER — Ambulatory Visit: Payer: Commercial Managed Care - HMO | Admitting: Family Medicine

## 2023-02-28 ENCOUNTER — Ambulatory Visit (INDEPENDENT_AMBULATORY_CARE_PROVIDER_SITE_OTHER): Payer: Commercial Managed Care - HMO | Admitting: Family Medicine

## 2023-02-28 ENCOUNTER — Encounter: Payer: Self-pay | Admitting: Family Medicine

## 2023-02-28 VITALS — BP 120/73 | HR 64 | Temp 98.7°F | Wt 224.5 lb

## 2023-02-28 DIAGNOSIS — I471 Supraventricular tachycardia, unspecified: Secondary | ICD-10-CM

## 2023-02-28 DIAGNOSIS — F330 Major depressive disorder, recurrent, mild: Secondary | ICD-10-CM

## 2023-02-28 DIAGNOSIS — I10 Essential (primary) hypertension: Secondary | ICD-10-CM | POA: Diagnosis not present

## 2023-02-28 DIAGNOSIS — L259 Unspecified contact dermatitis, unspecified cause: Secondary | ICD-10-CM | POA: Diagnosis not present

## 2023-02-28 DIAGNOSIS — E559 Vitamin D deficiency, unspecified: Secondary | ICD-10-CM

## 2023-02-28 DIAGNOSIS — J45909 Unspecified asthma, uncomplicated: Secondary | ICD-10-CM

## 2023-02-28 DIAGNOSIS — G43709 Chronic migraine without aura, not intractable, without status migrainosus: Secondary | ICD-10-CM

## 2023-02-28 DIAGNOSIS — D508 Other iron deficiency anemias: Secondary | ICD-10-CM

## 2023-02-28 MED ORDER — ALBUTEROL SULFATE (2.5 MG/3ML) 0.083% IN NEBU: 2.5000 mg | INHALATION_SOLUTION | Freq: Four times a day (QID) | RESPIRATORY_TRACT | 6 refills | Status: DC | PRN

## 2023-02-28 MED ORDER — OMEPRAZOLE 40 MG PO CPDR: DELAYED_RELEASE_CAPSULE | ORAL | 1 refills | Status: DC

## 2023-02-28 MED ORDER — PREDNISONE 10 MG PO TABS: ORAL_TABLET | ORAL | 0 refills | Status: DC

## 2023-02-28 MED ORDER — TRIAMCINOLONE ACETONIDE 40 MG/ML IJ SUSP
40.0000 mg | Freq: Once | INTRAMUSCULAR | Status: AC
Start: 2023-02-28 — End: 2023-02-28
  Administered 2023-02-28: 40 mg via INTRAMUSCULAR

## 2023-02-28 MED ORDER — ALBUTEROL SULFATE HFA 108 (90 BASE) MCG/ACT IN AERS: INHALATION_SPRAY | RESPIRATORY_TRACT | 6 refills | Status: DC

## 2023-02-28 MED ORDER — MONTELUKAST SODIUM 10 MG PO TABS: 10.0000 mg | ORAL_TABLET | Freq: Every day | ORAL | 1 refills | Status: AC

## 2023-02-28 MED ORDER — PROPRANOLOL HCL 40 MG PO TABS: 40.0000 mg | ORAL_TABLET | Freq: Two times a day (BID) | ORAL | 1 refills | Status: DC

## 2023-02-28 MED ORDER — CYCLOBENZAPRINE HCL 5 MG PO TABS: 5.0000 mg | ORAL_TABLET | Freq: Two times a day (BID) | ORAL | 1 refills | Status: DC | PRN

## 2023-02-28 NOTE — Assessment & Plan Note (Signed)
Rechecking labs today. Await results. Treat as needed.  °

## 2023-02-28 NOTE — Assessment & Plan Note (Signed)
Continue to follow with psychiatry. Stable. Call with any concerns.  

## 2023-02-28 NOTE — Assessment & Plan Note (Signed)
Under good control on current regimen. Continue current regimen. Continue to monitor. Call with any concerns. Refills up to date.   

## 2023-02-28 NOTE — Assessment & Plan Note (Signed)
Under good control on current regimen. Continue current regimen. Continue to monitor. Call with any concerns. Refills given. Labs drawn today.   

## 2023-02-28 NOTE — Progress Notes (Signed)
BP 120/73   Pulse 64   Temp 98.7 F (37.1 C) (Oral)   Wt 224 lb 8 oz (101.8 kg)   LMP  (LMP Unknown)   SpO2 100%   BMI 38.54 kg/m    Subjective:    Patient ID: Nancy Sanchez, female    DOB: 1970-12-21, 52 y.o.   MRN: [id number]  HPI: Nancy Sanchez is a 52 y.o. female  Chief Complaint  Patient presents with   Rash    Pt states she first noticed a rash on her legs on 01/24/23 when she was out walking. States she did not come into contact with anything. States it doesn't go away but calms down. States the areas do not burn or itch    RASH Duration:  about a month  Location: legs bilaterally  Itching: no Burning: no Redness: yes Oozing: no Scaling: yes Blisters: yes Painful: no Fevers: no Change in detergents/soaps/personal care products: no Recent illness: no Recent travel:yes History of same: no Context: stable Alleviating factors: nothing Treatments attempted:hydrocortisone cream, benadryl, OTC anit-fungal, and lotion/moisturizer Shortness of breath: no  Throat/tongue swelling: no Myalgias/arthralgias: no  HYPERTENSION  Hypertension status: controlled  Satisfied with current treatment? yes Duration of hypertension: chronic BP monitoring frequency:  not checking BP range:  BP medication side effects:  no Medication compliance: excellent compliance Previous BP meds:propranolol Aspirin: no Recurrent headaches: no Visual changes: no Palpitations: no Dyspnea: no Chest pain: no Lower extremity edema: no Dizzy/lightheaded: no  ANEMIA Anemia status: stable Etiology of anemia: iron def Duration of anemia treatment:  Compliance with treatment: excellent compliance Iron supplementation side effects: no Severity of anemia: moderate Fatigue: yes Decreased exercise tolerance: no  Dyspnea on exertion: no Palpitations: no Bleeding: no Pica: no  DEPRESSION- still following with psych, doing well. No concerns.  Mood status: stable Satisfied  with current treatment?: yes Symptom severity: moderate  Duration of current treatment : chronic Side effects: no Medication compliance: excellent compliance Psychotherapy/counseling: yes  Depressed mood: yes Anxious mood: yes Anhedonia: no Significant weight loss or gain: no Insomnia: no  Fatigue: yes Feelings of worthlessness or guilt: no Impaired concentration/indecisiveness: yes Suicidal ideations: no Hopelessness: no Crying spells: no    02/28/2023    3:30 PM 07/11/2022    9:00 AM 03/14/2022    9:16 AM 12/19/2021    8:15 AM 06/20/2021    9:00 AM  Depression screen PHQ 2/9  Decreased Interest 2 3 2 2 2   Down, Depressed, Hopeless 1 1 1 2 1   PHQ - 2 Score 3 4 3 4 3   Altered sleeping 3 2 3 2 2   Tired, decreased energy 3 3 3 3 3   Change in appetite 2 2 3 2 2   Feeling bad or failure about yourself  0 0 0 0 0  Trouble concentrating 3 3 3 3 3   Moving slowly or fidgety/restless 0 0 0 1 0  Suicidal thoughts 0 0 0 0 0  PHQ-9 Score 14 14 15 15 13   Difficult doing work/chores Somewhat difficult    Somewhat difficult     Relevant past medical, surgical, family and social history reviewed and updated as indicated. Interim medical history since our last visit reviewed. Allergies and medications reviewed and updated.  Review of Systems  Constitutional: Negative.   Respiratory: Negative.    Cardiovascular: Negative.   Gastrointestinal: Negative.   Musculoskeletal: Negative.   Skin:  Positive for rash. Negative for color change, pallor and wound.  Neurological: Negative.  Psychiatric/Behavioral: Negative.      Per HPI unless specifically indicated above     Objective:    BP 120/73   Pulse 64   Temp 98.7 F (37.1 C) (Oral)   Wt 224 lb 8 oz (101.8 kg)   LMP  (LMP Unknown)   SpO2 100%   BMI 38.54 kg/m   Wt Readings from Last 3 Encounters:  02/28/23 224 lb 8 oz (101.8 kg)  07/11/22 216 lb 6.4 oz (98.2 kg)  03/14/22 219 lb 12.8 oz (99.7 kg)    Physical Exam Vitals  and nursing note reviewed.  Constitutional:      General: She is not in acute distress.    Appearance: Normal appearance. She is obese. She is not ill-appearing, toxic-appearing or diaphoretic.  HENT:     Head: Normocephalic and atraumatic.     Right Ear: External ear normal.     Left Ear: External ear normal.     Nose: Nose normal.     Mouth/Throat:     Mouth: Mucous membranes are moist.     Pharynx: Oropharynx is clear.  Eyes:     General: No scleral icterus.       Right eye: No discharge.        Left eye: No discharge.     Extraocular Movements: Extraocular movements intact.     Conjunctiva/sclera: Conjunctivae normal.     Pupils: Pupils are equal, round, and reactive to light.  Cardiovascular:     Rate and Rhythm: Normal rate and regular rhythm.     Pulses: Normal pulses.     Heart sounds: Normal heart sounds. No murmur heard.    No friction rub. No gallop.  Pulmonary:     Effort: Pulmonary effort is normal. No respiratory distress.     Breath sounds: Normal breath sounds. No stridor. No wheezing, rhonchi or rales.  Chest:     Chest wall: No tenderness.  Musculoskeletal:        General: Normal range of motion.     Cervical back: Normal range of motion and neck supple.  Skin:    General: Skin is warm and dry.     Capillary Refill: Capillary refill takes less than 2 seconds.     Coloration: Skin is not jaundiced or pale.     Findings: No bruising, erythema, lesion or rash.     Comments: Papular erythematous on back of her neck, legs bilaterally  Neurological:     General: No focal deficit present.     Mental Status: She is alert and oriented to person, place, and time. Mental status is at baseline.  Psychiatric:        Mood and Affect: Mood normal.        Behavior: Behavior normal.        Thought Content: Thought content normal.        Judgment: Judgment normal.     Results for orders placed or performed in visit on 11/09/22  QuantiFERON-TB Gold Plus  Result Value  Ref Range   QuantiFERON Incubation Incubation performed.    QuantiFERON Criteria Comment    QuantiFERON TB1 Ag Value 0.00 IU/mL   QuantiFERON TB2 Ag Value 0.00 IU/mL   QuantiFERON Nil Value 0.00 IU/mL   QuantiFERON Mitogen Value 1.59 IU/mL   QuantiFERON-TB Gold Plus Negative Negative      Assessment & Plan:   Problem List Items Addressed This Visit       Cardiovascular and Mediastinum   Paroxysmal supraventricular tachycardia  Under good control on current regimen. Continue current regimen. Continue to monitor. Call with any concerns. Refills given. Labs drawn today.      Relevant Medications   propranolol (INDERAL) 40 MG tablet   Other Relevant Orders   CBC with Differential/Platelet   Comprehensive metabolic panel   Hypertension    Under good control on current regimen. Continue current regimen. Continue to monitor. Call with any concerns. Refills given. Labs drawn today.       Relevant Medications   propranolol (INDERAL) 40 MG tablet   Other Relevant Orders   CBC with Differential/Platelet   Comprehensive metabolic panel   Migraines    Under good control on current regimen. Continue current regimen. Continue to monitor. Call with any concerns. Refills up to date.       Relevant Medications   propranolol (INDERAL) 40 MG tablet   cyclobenzaprine (FLEXERIL) 5 MG tablet   Other Relevant Orders   CBC with Differential/Platelet   Comprehensive metabolic panel     Respiratory   Asthma    Under good control on current regimen. Continue current regimen. Continue to monitor. Call with any concerns. Refills given. Labs drawn today.      Relevant Medications   predniSONE (DELTASONE) 10 MG tablet   triamcinolone acetonide (KENALOG-40) injection 40 mg (Start on 02/28/2023  4:00 PM)   montelukast (SINGULAIR) 10 MG tablet   albuterol (PROVENTIL) (2.5 MG/3ML) 0.083% nebulizer solution   albuterol (VENTOLIN HFA) 108 (90 Base) MCG/ACT inhaler   Other Relevant Orders   CBC  with Differential/Platelet   Comprehensive metabolic panel     Other   Iron deficiency anemia    Rechecking labs today. Await results. Treat as needed.       Relevant Orders   CBC with Differential/Platelet   Comprehensive metabolic panel   Ferritin   Iron Binding Cap (TIBC)([facility])   Vitamin D deficiency    Rechecking labs today. Await results. Treat as needed.       Relevant Orders   CBC with Differential/Platelet   Comprehensive metabolic panel   VITAMIN D 25 Hydroxy (Vit-D Deficiency, Fractures)   Mild episode of recurrent major depressive disorder (HCC)    Continue to follow with psychiatry. Stable. Call with any concerns.       Relevant Orders   CBC with Differential/Platelet   Comprehensive metabolic panel   Other Visit Diagnoses     Contact dermatitis, unspecified contact dermatitis type, unspecified trigger    -  Primary   Will treat with steroid taper, if not improving will call and we'll get her into dermatology.   Relevant Medications   triamcinolone acetonide (KENALOG-40) injection 40 mg (Start on 02/28/2023  4:00 PM)        Follow up plan: Return September Physical.

## 2023-03-01 LAB — CBC WITH DIFFERENTIAL/PLATELET
Basophils Absolute: 0 10*3/uL (ref 0.0–0.2)
Basos: 0 %
EOS (ABSOLUTE): 0.2 10*3/uL (ref 0.0–0.4)
Eos: 3 %
Hematocrit: 38.1 % (ref 34.0–46.6)
Hemoglobin: 12.6 g/dL (ref 11.1–15.9)
Immature Grans (Abs): 0 10*3/uL (ref 0.0–0.1)
Immature Granulocytes: 0 %
Lymphocytes Absolute: 1.6 10*3/uL (ref 0.7–3.1)
Lymphs: 25 %
MCH: 29.9 pg (ref 26.6–33.0)
MCHC: 33.1 g/dL (ref 31.5–35.7)
MCV: 90 fL (ref 79–97)
Monocytes Absolute: 0.6 10*3/uL (ref 0.1–0.9)
Monocytes: 10 %
Neutrophils Absolute: 4.1 10*3/uL (ref 1.4–7.0)
Neutrophils: 62 %
Platelets: 292 10*3/uL (ref 150–450)
RBC: 4.22 x10E6/uL (ref 3.77–5.28)
RDW: 12.3 % (ref 11.7–15.4)
WBC: 6.5 10*3/uL (ref 3.4–10.8)

## 2023-03-01 LAB — COMPREHENSIVE METABOLIC PANEL
ALT: 16 IU/L (ref 0–32)
AST: 16 IU/L (ref 0–40)
Albumin/Globulin Ratio: 2.4 — ABNORMAL HIGH (ref 1.2–2.2)
Albumin: 4.5 g/dL (ref 3.8–4.9)
Alkaline Phosphatase: 84 IU/L (ref 44–121)
BUN/Creatinine Ratio: 47 — ABNORMAL HIGH (ref 9–23)
BUN: 29 mg/dL — ABNORMAL HIGH (ref 6–24)
Bilirubin Total: 0.3 mg/dL (ref 0.0–1.2)
CO2: 21 mmol/L (ref 20–29)
Calcium: 9.3 mg/dL (ref 8.7–10.2)
Chloride: 106 mmol/L (ref 96–106)
Creatinine, Ser: 0.62 mg/dL (ref 0.57–1.00)
Globulin, Total: 1.9 g/dL (ref 1.5–4.5)
Glucose: 80 mg/dL (ref 70–99)
Potassium: 4.3 mmol/L (ref 3.5–5.2)
Sodium: 143 mmol/L (ref 134–144)
Total Protein: 6.4 g/dL (ref 6.0–8.5)
eGFR: 108 mL/min/{1.73_m2} (ref >59)

## 2023-03-01 LAB — VITAMIN D 25 HYDROXY (VIT D DEFICIENCY, FRACTURES): Vit D, 25-Hydroxy: 38.4 ng/mL (ref 30.0–100.0)

## 2023-03-01 LAB — IRON AND TIBC
Iron Saturation: 24 % (ref 15–55)
Iron: 72 ug/dL (ref 27–159)
Total Iron Binding Capacity: 305 ug/dL (ref 250–450)
UIBC: 233 ug/dL (ref 131–425)

## 2023-03-01 LAB — FERRITIN: Ferritin: 175 ng/mL — ABNORMAL HIGH (ref 15–150)

## 2023-03-14 ENCOUNTER — Encounter: Payer: Self-pay | Admitting: Family Medicine

## 2023-03-15 ENCOUNTER — Ambulatory Visit: Payer: Commercial Managed Care - HMO | Admitting: Family Medicine

## 2023-03-18 MED ORDER — NYSTATIN 100000 UNIT/GM EX POWD: Freq: Three times a day (TID) | CUTANEOUS | 5 refills | Status: DC

## 2023-03-18 MED ORDER — NYSTATIN 100000 UNIT/GM EX CREA: 1.0000 | TOPICAL_CREAM | Freq: Two times a day (BID) | CUTANEOUS | 5 refills | Status: DC

## 2023-03-20 ENCOUNTER — Ambulatory Visit: Payer: Self-pay | Admitting: *Deleted

## 2023-03-20 NOTE — Telephone Encounter (Signed)
Message from [place] sent at 03/20/2023  9:17 AM EDT  Summary: directions for Nystatin powder and cream   Nikki from [facility] pharmacy called in about needing clear directions for Nystatin cream and powder. It has 2 different set of directions. Please call back          Call History   Type Contact Phone/Fax User  03/20/2023 09:16 AM EDT Phone (Incoming) [id number] - [place], [place] - [address] (Pharmacy) [phone] Glean Salen   Reason for Disposition  [1] Pharmacy calling with prescription question AND [2] triager unable to answer question  Answer Assessment - Initial Assessment Questions 1. NAME of MEDICINE: "What medicine(s) are you calling about?"     Nystatin 2. QUESTION: "What is your question?" (e.g., double dose of medicine, side effect)     [facility] needing clarification on dosing Message sent to Dr. Laural Benes at [facility] 3. PRESCRIBER: "Who prescribed the medicine?" Reason: if prescribed by specialist, call should be referred to that group.     Dr. Laural Benes 4. SYMPTOMS: "Do you have any symptoms?" If Yes, ask: "What symptoms are you having?"  "How bad are the symptoms (e.g., mild, moderate, severe)     N/A 5. PREGNANCY:  "Is there any chance that you are pregnant?" "When was your last menstrual period?"     N/A  Protocols used: Medication Question Call-A-AH

## 2023-03-20 NOTE — Telephone Encounter (Signed)
  Chief Complaint: See message from [facility].   Nikki needing clarification on the Nystatin rx. Symptoms: N/A Frequency: N/A Pertinent Negatives: Patient denies N/A Disposition: [] ED /[] Urgent Care (no appt availability in office) / [] Appointment(In office/virtual)/ []  [facility]/ [] Home Care/ [] Refused Recommended Disposition /[] [facility]/ [x]  Follow-up with PCP Additional Notes: Message sent to Dr. Olevia Perches for clarification.       Nikki from [facility]   [phone]

## 2023-03-21 MED ORDER — NYSTATIN 100000 UNIT/GM EX CREA: 1.0000 | TOPICAL_CREAM | Freq: Two times a day (BID) | CUTANEOUS | 5 refills | Status: DC

## 2023-03-21 MED ORDER — NYSTATIN 100000 UNIT/GM EX POWD: Freq: Three times a day (TID) | CUTANEOUS | 5 refills | Status: DC

## 2023-03-21 NOTE — Telephone Encounter (Signed)
There are not 2 sets of directions. There are 2 different medicines with 2 different directions.

## 2023-03-21 NOTE — Addendum Note (Signed)
Addended by: Dorcas Carrow on: 03/21/2023 07:23 PM   Modules accepted: Orders

## 2023-03-22 NOTE — Telephone Encounter (Signed)
Spoke with Abgail from [facility] to clarify patient's prescriptions. Pharmacist says they were calling to make sure Dr Laural Benes wanted patient to be prescribed both medications at once. Informed pharmacist that is correct. Pharmacist verbalized understanding and has no further questions.

## 2023-08-15 ENCOUNTER — Other Ambulatory Visit: Payer: Self-pay | Admitting: Family Medicine

## 2023-08-17 NOTE — Telephone Encounter (Signed)
Due for follow up in November.

## 2023-08-17 NOTE — Telephone Encounter (Signed)
Requested medication (s) are due for refill today: expired date  Requested medication (s) are on the active medication list: yes   Last refill:  07/18/22 #8 12 refills   Future visit scheduled: no   Notes to clinic:  expired date of medication. Do you want to renew Rx?     Requested Prescriptions  Pending Prescriptions Disp Refills   rizatriptan (MAXALT) 10 MG tablet [Pharmacy Med Name: Rizatriptan Benzoate Oral Tablet 10 MG] 8 tablet 0    Sig: TAKE ONE TABLET BY MOUTH AT ONSET OF HEADACHE. MAY REPEAT ONE TABLET IN 2 HOURS IF NEEDED     Neurology:  Migraine Therapy - Triptan Passed - 08/15/2023  4:43 PM      Passed - Last BP in normal range    BP Readings from Last 1 Encounters:  02/28/23 120/73         Passed - Valid encounter within last 12 months    Recent Outpatient Visits           5 months ago Contact dermatitis, unspecified contact dermatitis type, unspecified trigger   [place] [facility] [place], Megan P, DO   1 year ago Routine general medical examination at a health care facility   [facility] [place], [place] P, DO   1 year ago Environmental allergies   [place] [facility] [place], [place] P, DO   1 year ago Primary hypertension   [place] [facility] [place], [place] P, DO   2 years ago Routine general medical examination at a health care facility   [facility], [place] P, DO              Signed Prescriptions Disp Refills   albuterol (VENTOLIN HFA) 108 (90 Base) MCG/ACT inhaler 8.5 g 0    Sig: INHALE ONE TO TWO PUFFS BY MOUTH 4 TIMES A DAY WHEN NEEDED     Pulmonology:  Beta Agonists 2 Passed - 08/15/2023  4:43 PM      Passed - Last BP in normal range    BP Readings from Last 1 Encounters:  02/28/23 120/73         Passed - Last Heart Rate in normal range    Pulse Readings from Last 1 Encounters:  02/28/23 64         Passed - Valid encounter within  last 12 months    Recent Outpatient Visits           5 months ago Contact dermatitis, unspecified contact dermatitis type, unspecified trigger   [place] [facility] [place], Oralia Rud, DO   1 year ago Routine general medical examination at a health care facility   [facility] [place], Megan P, DO   1 year ago Environmental allergies   [place] [facility] [place], [place], DO   1 year ago Primary hypertension   [place] [facility] [place], [place] P, DO   2 years ago Routine general medical examination at a health care facility   [facility] [place], Megan P, DO

## 2023-08-17 NOTE — Telephone Encounter (Signed)
Requested by interface surescripts. Protocols met. Requested Prescriptions  Pending Prescriptions Disp Refills   rizatriptan (MAXALT) 10 MG tablet [Pharmacy Med Name: Rizatriptan Benzoate Oral Tablet 10 MG] 8 tablet 0    Sig: TAKE ONE TABLET BY MOUTH AT ONSET OF HEADACHE. MAY REPEAT ONE TABLET IN 2 HOURS IF NEEDED     Neurology:  Migraine Therapy - Triptan Passed - 08/15/2023  4:43 PM      Passed - Last BP in normal range    BP Readings from Last 1 Encounters:  02/28/23 120/73         Passed - Valid encounter within last 12 months    Recent Outpatient Visits           5 months ago Contact dermatitis, unspecified contact dermatitis type, unspecified trigger   [place] [facility] [place], Megan P, DO   1 year ago Routine general medical examination at a health care facility   [facility] [place], [place] P, DO   1 year ago Environmental allergies   [place] [facility] [place], [place], DO   1 year ago Primary hypertension   [place] [facility] [place], [place] P, DO   2 years ago Routine general medical examination at a health care facility   [facility], Megan P, DO               albuterol (VENTOLIN HFA) 108 (90 Base) MCG/ACT inhaler [Pharmacy Med Name: Albuterol Sulfate HFA Inhalation Aerosol Solution 108 (90 Base) MCG/ACT] 8.5 g 0    Sig: INHALE ONE TO TWO PUFFS BY MOUTH 4 TIMES A DAY WHEN NEEDED     Pulmonology:  Beta Agonists 2 Passed - 08/15/2023  4:43 PM      Passed - Last BP in normal range    BP Readings from Last 1 Encounters:  02/28/23 120/73         Passed - Last Heart Rate in normal range    Pulse Readings from Last 1 Encounters:  02/28/23 64         Passed - Valid encounter within last 12 months    Recent Outpatient Visits           5 months ago Contact dermatitis, unspecified contact dermatitis type, unspecified trigger   [place] [facility] [place], Oralia Rud, DO   1 year ago Routine general medical examination at a health care facility   [facility] [place], Megan P, DO   1 year ago Environmental allergies   [place] [facility] [place], [place], DO   1 year ago Primary hypertension   [facility] [place], [place] P, DO   2 years ago Routine general medical examination at a health care facility   [facility] [place], Megan P, DO

## 2023-08-21 NOTE — Telephone Encounter (Signed)
Called and scheduled patient on 09/13/2023 @ 9:00 am for her physical.

## 2023-09-04 LAB — HM MAMMOGRAPHY

## 2023-09-10 ENCOUNTER — Other Ambulatory Visit: Payer: Self-pay | Admitting: Family Medicine

## 2023-09-10 DIAGNOSIS — J4541 Moderate persistent asthma with (acute) exacerbation: Secondary | ICD-10-CM

## 2023-09-10 NOTE — Telephone Encounter (Signed)
 Requesting prescriptions be sent to mail order.

## 2023-09-13 ENCOUNTER — Encounter: Payer: Commercial Managed Care - HMO | Admitting: Family Medicine

## 2023-09-14 ENCOUNTER — Other Ambulatory Visit: Payer: Self-pay

## 2023-09-14 DIAGNOSIS — J4541 Moderate persistent asthma with (acute) exacerbation: Secondary | ICD-10-CM

## 2023-10-08 ENCOUNTER — Other Ambulatory Visit: Payer: Self-pay | Admitting: Family Medicine

## 2023-10-09 NOTE — Telephone Encounter (Signed)
Requested medication (s) are due for refill today: yes   Requested medication (s) are on the active medication list: yes   Last refill:  02/28/23 #180 0 refills   Future visit scheduled: no   Notes to clinic:  unable to change PCP . Last OV 02/28/23 with Dr. Laural Benes. Do you want to refill Rx?     Requested Prescriptions  Pending Prescriptions Disp Refills   omeprazole (PRILOSEC) 40 MG capsule [Pharmacy Med Name: OMEPRAZOLE 40MG  CAPSULES] 180 capsule 1    Sig: TAKE 1 CAPSULE BY MOUTH TWICE DAILY BEFORE A MEAL     Gastroenterology: Proton Pump Inhibitors Passed - 10/09/2023  1:14 PM      Passed - Valid encounter within last 12 months    Recent Outpatient Visits           7 months ago Contact dermatitis, unspecified contact dermatitis type, unspecified trigger   [place] [facility] [place], Megan P, DO   1 year ago Routine general medical examination at a health care facility   [facility] [place], Megan P, DO   1 year ago Environmental allergies   [place] [facility] [place], [place], DO   1 year ago Primary hypertension   [place] [facility] [place], [place] P, DO   2 years ago Routine general medical examination at a health care facility   [facility], Megan P, DO

## 2023-10-28 DIAGNOSIS — G4733 Obstructive sleep apnea (adult) (pediatric): Secondary | ICD-10-CM | POA: Diagnosis not present

## 2023-10-30 DIAGNOSIS — F32A Depression, unspecified: Secondary | ICD-10-CM | POA: Diagnosis not present

## 2023-10-30 DIAGNOSIS — F419 Anxiety disorder, unspecified: Secondary | ICD-10-CM | POA: Diagnosis not present

## 2023-11-03 DIAGNOSIS — R0683 Snoring: Secondary | ICD-10-CM | POA: Diagnosis not present

## 2023-11-13 ENCOUNTER — Other Ambulatory Visit: Payer: Medicare HMO

## 2023-11-13 DIAGNOSIS — F32A Depression, unspecified: Secondary | ICD-10-CM | POA: Diagnosis not present

## 2023-11-13 DIAGNOSIS — F419 Anxiety disorder, unspecified: Secondary | ICD-10-CM | POA: Diagnosis not present

## 2023-11-13 DIAGNOSIS — Z111 Encounter for screening for respiratory tuberculosis: Secondary | ICD-10-CM

## 2023-11-18 ENCOUNTER — Encounter: Payer: Self-pay | Admitting: Family Medicine

## 2023-11-18 LAB — QUANTIFERON-TB GOLD PLUS
QuantiFERON Mitogen Value: >10 [IU]/mL
QuantiFERON Nil Value: 0.01 [IU]/mL
QuantiFERON TB1 Ag Value: 0.01 [IU]/mL
QuantiFERON TB2 Ag Value: 0.02 [IU]/mL
QuantiFERON-TB Gold Plus: NEGATIVE

## 2023-11-19 DIAGNOSIS — M542 Cervicalgia: Secondary | ICD-10-CM | POA: Diagnosis not present

## 2023-11-19 DIAGNOSIS — M5412 Radiculopathy, cervical region: Secondary | ICD-10-CM | POA: Diagnosis not present

## 2023-11-19 DIAGNOSIS — G8929 Other chronic pain: Secondary | ICD-10-CM | POA: Diagnosis not present

## 2023-11-19 DIAGNOSIS — M5416 Radiculopathy, lumbar region: Secondary | ICD-10-CM | POA: Diagnosis not present

## 2023-11-19 DIAGNOSIS — M5442 Lumbago with sciatica, left side: Secondary | ICD-10-CM | POA: Diagnosis not present

## 2023-11-20 DIAGNOSIS — F32A Depression, unspecified: Secondary | ICD-10-CM | POA: Diagnosis not present

## 2023-11-20 DIAGNOSIS — F419 Anxiety disorder, unspecified: Secondary | ICD-10-CM | POA: Diagnosis not present

## 2023-11-21 DIAGNOSIS — M25551 Pain in right hip: Secondary | ICD-10-CM | POA: Diagnosis not present

## 2023-11-21 DIAGNOSIS — E669 Obesity, unspecified: Secondary | ICD-10-CM | POA: Diagnosis not present

## 2023-11-21 DIAGNOSIS — Z9884 Bariatric surgery status: Secondary | ICD-10-CM | POA: Diagnosis not present

## 2023-11-21 DIAGNOSIS — E785 Hyperlipidemia, unspecified: Secondary | ICD-10-CM | POA: Diagnosis not present

## 2023-11-21 DIAGNOSIS — F32A Depression, unspecified: Secondary | ICD-10-CM | POA: Diagnosis not present

## 2023-11-21 DIAGNOSIS — I10 Essential (primary) hypertension: Secondary | ICD-10-CM | POA: Diagnosis not present

## 2023-11-21 DIAGNOSIS — Z6839 Body mass index (BMI) 39.0-39.9, adult: Secondary | ICD-10-CM | POA: Diagnosis not present

## 2023-11-21 DIAGNOSIS — Z96641 Presence of right artificial hip joint: Secondary | ICD-10-CM | POA: Diagnosis not present

## 2023-11-21 DIAGNOSIS — R7303 Prediabetes: Secondary | ICD-10-CM | POA: Diagnosis not present

## 2023-11-27 DIAGNOSIS — F32A Depression, unspecified: Secondary | ICD-10-CM | POA: Diagnosis not present

## 2023-11-27 DIAGNOSIS — F419 Anxiety disorder, unspecified: Secondary | ICD-10-CM | POA: Diagnosis not present

## 2023-11-28 DIAGNOSIS — G4733 Obstructive sleep apnea (adult) (pediatric): Secondary | ICD-10-CM | POA: Diagnosis not present

## 2023-11-30 DIAGNOSIS — M5416 Radiculopathy, lumbar region: Secondary | ICD-10-CM | POA: Diagnosis not present

## 2023-12-04 DIAGNOSIS — R0683 Snoring: Secondary | ICD-10-CM | POA: Diagnosis not present

## 2023-12-04 DIAGNOSIS — F32A Depression, unspecified: Secondary | ICD-10-CM | POA: Diagnosis not present

## 2023-12-04 DIAGNOSIS — F419 Anxiety disorder, unspecified: Secondary | ICD-10-CM | POA: Diagnosis not present

## 2023-12-06 ENCOUNTER — Encounter: Payer: Medicaid Other | Admitting: Family Medicine

## 2023-12-11 DIAGNOSIS — Z9884 Bariatric surgery status: Secondary | ICD-10-CM | POA: Diagnosis not present

## 2023-12-11 DIAGNOSIS — K219 Gastro-esophageal reflux disease without esophagitis: Secondary | ICD-10-CM | POA: Diagnosis not present

## 2023-12-11 DIAGNOSIS — Z6841 Body Mass Index (BMI) 40.0 and over, adult: Secondary | ICD-10-CM | POA: Diagnosis not present

## 2023-12-11 DIAGNOSIS — R053 Chronic cough: Secondary | ICD-10-CM | POA: Diagnosis not present

## 2023-12-11 DIAGNOSIS — K2270 Barrett's esophagus without dysplasia: Secondary | ICD-10-CM | POA: Diagnosis not present

## 2023-12-12 DIAGNOSIS — J301 Allergic rhinitis due to pollen: Secondary | ICD-10-CM | POA: Diagnosis not present

## 2023-12-12 DIAGNOSIS — J3081 Allergic rhinitis due to animal (cat) (dog) hair and dander: Secondary | ICD-10-CM | POA: Diagnosis not present

## 2023-12-12 DIAGNOSIS — R0982 Postnasal drip: Secondary | ICD-10-CM | POA: Diagnosis not present

## 2023-12-12 DIAGNOSIS — J302 Other seasonal allergic rhinitis: Secondary | ICD-10-CM | POA: Diagnosis not present

## 2023-12-12 DIAGNOSIS — F419 Anxiety disorder, unspecified: Secondary | ICD-10-CM | POA: Diagnosis not present

## 2023-12-12 DIAGNOSIS — F32A Depression, unspecified: Secondary | ICD-10-CM | POA: Diagnosis not present

## 2023-12-12 DIAGNOSIS — R0602 Shortness of breath: Secondary | ICD-10-CM | POA: Diagnosis not present

## 2023-12-12 DIAGNOSIS — K449 Diaphragmatic hernia without obstruction or gangrene: Secondary | ICD-10-CM | POA: Diagnosis not present

## 2023-12-12 DIAGNOSIS — K2270 Barrett's esophagus without dysplasia: Secondary | ICD-10-CM | POA: Diagnosis not present

## 2023-12-12 DIAGNOSIS — K2100 Gastro-esophageal reflux disease with esophagitis, without bleeding: Secondary | ICD-10-CM | POA: Diagnosis not present

## 2023-12-19 DIAGNOSIS — F32A Depression, unspecified: Secondary | ICD-10-CM | POA: Diagnosis not present

## 2023-12-19 DIAGNOSIS — F419 Anxiety disorder, unspecified: Secondary | ICD-10-CM | POA: Diagnosis not present

## 2023-12-20 DIAGNOSIS — F410 Panic disorder [episodic paroxysmal anxiety] without agoraphobia: Secondary | ICD-10-CM | POA: Diagnosis not present

## 2023-12-20 DIAGNOSIS — F5104 Psychophysiologic insomnia: Secondary | ICD-10-CM | POA: Diagnosis not present

## 2023-12-20 DIAGNOSIS — F411 Generalized anxiety disorder: Secondary | ICD-10-CM | POA: Diagnosis not present

## 2023-12-20 DIAGNOSIS — F3341 Major depressive disorder, recurrent, in partial remission: Secondary | ICD-10-CM | POA: Diagnosis not present

## 2023-12-24 DIAGNOSIS — M5412 Radiculopathy, cervical region: Secondary | ICD-10-CM | POA: Diagnosis not present

## 2023-12-24 DIAGNOSIS — M5416 Radiculopathy, lumbar region: Secondary | ICD-10-CM | POA: Diagnosis not present

## 2023-12-24 DIAGNOSIS — M5442 Lumbago with sciatica, left side: Secondary | ICD-10-CM | POA: Diagnosis not present

## 2023-12-24 DIAGNOSIS — H04123 Dry eye syndrome of bilateral lacrimal glands: Secondary | ICD-10-CM | POA: Diagnosis not present

## 2023-12-24 DIAGNOSIS — H40003 Preglaucoma, unspecified, bilateral: Secondary | ICD-10-CM | POA: Diagnosis not present

## 2023-12-24 DIAGNOSIS — M542 Cervicalgia: Secondary | ICD-10-CM | POA: Diagnosis not present

## 2023-12-24 DIAGNOSIS — G8929 Other chronic pain: Secondary | ICD-10-CM | POA: Diagnosis not present

## 2023-12-26 DIAGNOSIS — F419 Anxiety disorder, unspecified: Secondary | ICD-10-CM | POA: Diagnosis not present

## 2023-12-26 DIAGNOSIS — F32A Depression, unspecified: Secondary | ICD-10-CM | POA: Diagnosis not present

## 2024-01-01 DIAGNOSIS — R0683 Snoring: Secondary | ICD-10-CM | POA: Diagnosis not present

## 2024-01-02 DIAGNOSIS — F32A Depression, unspecified: Secondary | ICD-10-CM | POA: Diagnosis not present

## 2024-01-02 DIAGNOSIS — F419 Anxiety disorder, unspecified: Secondary | ICD-10-CM | POA: Diagnosis not present

## 2024-01-03 ENCOUNTER — Ambulatory Visit (INDEPENDENT_AMBULATORY_CARE_PROVIDER_SITE_OTHER): Payer: Medicare HMO | Admitting: Family Medicine

## 2024-01-03 ENCOUNTER — Encounter: Payer: Self-pay | Admitting: Family Medicine

## 2024-01-03 VITALS — BP 102/69 | HR 112 | Temp 98.4°F | Resp 17 | Ht 64.76 in | Wt 228.0 lb

## 2024-01-03 DIAGNOSIS — Z780 Asymptomatic menopausal state: Secondary | ICD-10-CM | POA: Diagnosis not present

## 2024-01-03 DIAGNOSIS — Z9884 Bariatric surgery status: Secondary | ICD-10-CM | POA: Diagnosis not present

## 2024-01-03 DIAGNOSIS — F419 Anxiety disorder, unspecified: Secondary | ICD-10-CM

## 2024-01-03 DIAGNOSIS — Z0000 Encounter for general adult medical examination without abnormal findings: Secondary | ICD-10-CM

## 2024-01-03 DIAGNOSIS — J45909 Unspecified asthma, uncomplicated: Secondary | ICD-10-CM

## 2024-01-03 DIAGNOSIS — Z136 Encounter for screening for cardiovascular disorders: Secondary | ICD-10-CM | POA: Diagnosis not present

## 2024-01-03 DIAGNOSIS — Z131 Encounter for screening for diabetes mellitus: Secondary | ICD-10-CM | POA: Diagnosis not present

## 2024-01-03 DIAGNOSIS — I10 Essential (primary) hypertension: Secondary | ICD-10-CM

## 2024-01-03 DIAGNOSIS — F330 Major depressive disorder, recurrent, mild: Secondary | ICD-10-CM | POA: Diagnosis not present

## 2024-01-03 DIAGNOSIS — E559 Vitamin D deficiency, unspecified: Secondary | ICD-10-CM | POA: Diagnosis not present

## 2024-01-03 DIAGNOSIS — I471 Supraventricular tachycardia, unspecified: Secondary | ICD-10-CM

## 2024-01-03 DIAGNOSIS — D508 Other iron deficiency anemias: Secondary | ICD-10-CM

## 2024-01-03 LAB — MICROALBUMIN, URINE WAIVED
Creatinine, Urine Waived: 100 mg/dL (ref 10–300)
Microalb, Ur Waived: 30 mg/L — ABNORMAL HIGH (ref 0–19)
Microalb/Creat Ratio: <30 mg/g (ref <30)

## 2024-01-03 LAB — BAYER DCA HB A1C WAIVED: HB A1C (BAYER DCA - WAIVED): 5 % (ref 4.8–5.6)

## 2024-01-03 NOTE — Progress Notes (Signed)
 BP 102/69 (BP Location: Left Arm, Patient Position: Sitting, Cuff Size: Large)   Pulse (!) 112   Temp 98.4 F (36.9 C) (Oral)   Resp 17   Ht 5' 4.76" (1.645 m)   Wt 228 lb (103.4 kg)   LMP  (LMP Unknown)   SpO2 98%   BMI 38.22 kg/m    Subjective:    Patient ID: Nancy Sanchez, female    DOB: Jul 24, 1971, 53 y.o.   MRN: [id number]  HPI: Nancy Sanchez is a 53 y.o. female presenting on 01/03/2024 for comprehensive medical examination after being lost to follow up for almost a year. Current medical complaints include:  HYPERTENSION  Hypertension status: controlled  Satisfied with current treatment? yes Duration of hypertension: chronic BP monitoring frequency:  not checking BP medication side effects:  no Medication compliance: excellent compliance Previous BP meds: propranolol Aspirin: no Recurrent headaches: no Visual changes: no Palpitations: no Dyspnea: no Chest pain: no Lower extremity edema: no Dizzy/lightheaded: no  ANXIETY/DEPRESSION- has been seeing psychiatry Duration: chronic Status:stable Anxious mood: no  Excessive worrying: no Irritability: no  Sweating: no Nausea: no Palpitations:no Hyperventilation: no Panic attacks: no Agoraphobia: no  Obscessions/compulsions: no Depressed mood: yes    01/03/2024    1:16 PM 02/28/2023    3:30 PM 07/11/2022    9:00 AM 03/14/2022    9:16 AM 12/19/2021    8:15 AM  Depression screen PHQ 2/9  Decreased Interest 3 2 3 2 2   Down, Depressed, Hopeless 1 1 1 1 2   PHQ - 2 Score 4 3 4 3 4   Altered sleeping 2 3 2 3 2   Tired, decreased energy 3 3 3 3 3   Change in appetite 3 2 2 3 2   Feeling bad or failure about yourself  0 0 0 0 0  Trouble concentrating 3 3 3 3 3   Moving slowly or fidgety/restless 0 0 0 0 1  Suicidal thoughts 0 0 0 0 0  PHQ-9 Score 15 14 14 15 15   Difficult doing work/chores  Somewhat difficult      Anhedonia: no Weight changes: yes Insomnia: no   Hypersomnia: no Fatigue/loss of energy:  yes Feelings of worthlessness: no Feelings of guilt: no Impaired concentration/indecisiveness: no Suicidal ideations: no  Crying spells: no Recent Stressors/Life Changes: no   Relationship problems: no   Family stress: no     Financial stress: no    Job stress: no    Recent death/loss: no  ANEMIA Anemia status: stable Etiology of anemia: iron deficiency Duration of anemia treatment: chronic Compliance with treatment: excellent compliance Iron supplementation side effects: no Severity of anemia: moderate Fatigue: yes Decreased exercise tolerance: no  Dyspnea on exertion: no Palpitations: no Bleeding: no Pica: no  Menopausal Symptoms: no  Functional Status Survey: Is the patient deaf or have difficulty hearing?: No Does the patient have difficulty seeing, even when wearing glasses/contacts?: Yes (Wears glasses for reading only) Does the patient have difficulty concentrating, remembering, or making decisions?: Yes (Remembering) Does the patient have difficulty walking or climbing stairs?: Yes (Goes slow) Does the patient have difficulty dressing or bathing?: No Does the patient have difficulty doing errands alone such as visiting a doctor's office or shopping?: No     01/03/2024    1:16 PM 02/28/2023    3:29 PM 07/11/2022    9:19 AM 12/19/2021    8:15 AM 06/20/2021    8:59 AM  Fall Risk   Falls in the past year? 0 0  0 1 0  Number falls in past yr: 0 0 0 1 0  Injury with Fall? 0 0 0 1 0  Risk for fall due to : No Fall Risks No Fall Risks No Fall Risks History of fall(s) No Fall Risks  Follow up Falls evaluation completed Falls evaluation completed Falls evaluation completed Falls evaluation completed Falls evaluation completed    Depression Screen    01/03/2024    1:16 PM 02/28/2023    3:30 PM 07/11/2022    9:00 AM 03/14/2022    9:16 AM 12/19/2021    8:15 AM  Depression screen PHQ 2/9  Decreased Interest 3 2 3 2 2   Down, Depressed, Hopeless 1 1 1 1 2   PHQ - 2 Score 4 3  4 3 4   Altered sleeping 2 3 2 3 2   Tired, decreased energy 3 3 3 3 3   Change in appetite 3 2 2 3 2   Feeling bad or failure about yourself  0 0 0 0 0  Trouble concentrating 3 3 3 3 3   Moving slowly or fidgety/restless 0 0 0 0 1  Suicidal thoughts 0 0 0 0 0  PHQ-9 Score 15 14 14 15 15   Difficult doing work/chores  Somewhat difficult      Advanced Directives Does patient have a HCPOA?    no If yes, name and contact information:  Does patient have a living will or MOST form?  no  Past Medical History:  Past Medical History:  Diagnosis Date   Abscess of abdominal wall 11/06/2017   Abscess of right foot 07/18/2017   ADHD    Allergy    Anemia    Anxiety    Arthritis    Asthma    Barrett's esophagus    Bulging lumbar disc    L5-S1   Clotting disorder (HCC)    heavy periods   Depression    Endometriosis    GERD (gastroesophageal reflux disease)    Hernia, hiatal    DDD   HPV in female    Hypertension    Infection of skin due to methicillin resistant Staphylococcus aureus (MRSA) 11/09/2017   Migraine    Osteoporosis     Surgical History:  Past Surgical History:  Procedure Laterality Date   ARTHROSCOPIC REPAIR ACL Left    CARPAL TUNNEL RELEASE Bilateral    ESSURE TUBAL LIGATION     GASTRIC BYPASS     HEEL SPUR EXCISION Left    INGUINAL HERNIA REPAIR  12/18/2017   JOINT REPLACEMENT Left    knee   SHOULDER ARTHROSCOPY W/ LABRAL REPAIR  03/2020   TONSILLECTOMY     TOTAL ABDOMINAL HYSTERECTOMY  01/18/2023   TOTAL HIP ARTHROPLASTY  09/28/2020    Medications:  Current Outpatient Medications on File Prior to Visit  Medication Sig   acetaminophen (TYLENOL) 650 MG CR tablet Take by mouth.   ascorbic acid (VITAMIN C) 500 MG tablet Take by mouth.   budesonide-formoterol (SYMBICORT) 160-4.5 MCG/ACT inhaler Inhale 2 puffs into the lungs 2 (two) times daily.   buPROPion (WELLBUTRIN XL) 300 MG 24 hr tablet Take 1 tablet (300 mg total) by mouth daily.   calcium carbonate  (OSCAL) 1500 (600 Ca) MG TABS tablet Take by mouth daily.    famotidine (PEPCID) 40 MG tablet Take 40 mg by mouth daily.   FLUoxetine (PROZAC) 20 MG capsule Take 20 mg by mouth daily.   fluticasone (CUTIVATE) 0.005 % ointment Apply topically as needed.    meclizine (ANTIVERT)  25 MG tablet Take by mouth as needed.    montelukast (SINGULAIR) 10 MG tablet Take 1 tablet (10 mg total) by mouth daily.   mupirocin ointment (BACTROBAN) 2 % Place 1 application into the nose 2 (two) times daily.   omeprazole (PRILOSEC) 40 MG capsule TAKE ONE CAPSULE BY MOUTH TWICE DAILY BEFORE A MEAL   QUEtiapine (SEROQUEL) 25 MG tablet Take 25 mg by mouth at bedtime.   silver sulfADIAZINE (SILVADENE) 1 % cream Apply topically.   TURMERIC PO Take by mouth daily.    UNABLE TO FIND Take by mouth daily. Part cherry capsule   No current facility-administered medications on file prior to visit.    Allergies:  Allergies  Allergen Reactions   Metronidazole Hives, Itching and Rash   Oxycodone-Acetaminophen Nausea And Vomiting    Other reaction(s): Nausea And Vomiting nausea nausea   Tape Rash   Aspirin     Unable to take aspirin products since RNY Gastric Bypass   Codeine     Other reaction(s): Other (See Comments)   Nickel Itching    Itching from earrings -unsure of composition   Nsaids Other (See Comments)    Gastric bypass   Other Itching    Itching from earrings -unsure of composition   Oxycodone Other (See Comments)    Other reaction(s): Vomiting   Fluticasone-Salmeterol Anxiety    Other Reaction: jittery Other Reaction: jittery Other Reaction: jittery Other Reaction: jittery Other Reaction: jittery   Latex Rash    Other Reaction: Rash on hands only Other Reaction: Rash on hands only   Morphine Anxiety and Itching    Does well with dilaudid and toradol Does well with dilaudid and toradol    Social History:  Social History   Socioeconomic History   Marital status: Single    Spouse name: Not  on file   Number of children: Not on file   Years of education: Not on file   Highest education level: Associate degree: occupational, Scientist, product/process development, or vocational program  Occupational History   Not on file  Tobacco Use   Smoking status: Former    Current packs/day: 0.00    Average packs/day: 1 pack/day for 15.0 years (15.0 ttl pk-yrs)    Types: Cigarettes    Quit date: 10/23/1998    Years since quitting: 25.2   Smokeless tobacco: Never  Vaping Use   Vaping status: Never Used  Substance and Sexual Activity   Alcohol use: No   Drug use: No   Sexual activity: Yes    Birth control/protection: Surgical  Other Topics Concern   Not on file  Social History Narrative   Not on file   Social Drivers of Health   Financial Resource Strain: Patient Declined (01/01/2024)   Overall Financial Resource Strain (CARDIA)    Difficulty of Paying Living Expenses: Patient declined  Food Insecurity: Patient Declined (01/01/2024)   Hunger Vital Sign    Worried About Running Out of Food in the Last Year: Patient declined    Ran Out of Food in the Last Year: Patient declined  Transportation Needs: No Transportation Needs (01/01/2024)   PRAPARE - Administrator, Civil Service (Medical): No    Lack of Transportation (Non-Medical): No  Physical Activity: Inactive (01/01/2024)   Exercise Vital Sign    Days of Exercise per Week: 0 days    Minutes of Exercise per Session: 60 min  Stress: Stress Concern Present (01/01/2024)   [employer] of Occupational Health - Occupational Stress Questionnaire  Feeling of Stress : To some extent  Social Connections: Socially Isolated (01/01/2024)   Social Connection and Isolation Panel [NHANES]    Frequency of Communication with Friends and Family: Once a week    Frequency of Social Gatherings with Friends and Family: Once a week    Attends Religious Services: 1 to 4 times per year    Active Member of [employer] or Organizations: No    Attends Museum/gallery exhibitions officer: Not on file    Marital Status: Divorced  Intimate Partner Violence: Not At Risk (01/03/2024)   Humiliation, Afraid, Rape, and Kick questionnaire    Fear of Current or Ex-Partner: No    Emotionally Abused: No    Physically Abused: No    Sexually Abused: No   Social History   Tobacco Use  Smoking Status Former   Current packs/day: 0.00   Average packs/day: 1 pack/day for 15.0 years (15.0 ttl pk-yrs)   Types: Cigarettes   Quit date: 10/23/1998   Years since quitting: 25.2  Smokeless Tobacco Never   Social History   Substance and Sexual Activity  Alcohol Use No    Family History:  Family History  Adopted: Yes  Problem Relation Age of Onset   Heart disease Father    Breast cancer Neg Hx     Past medical history, surgical history, medications, allergies, family history and social history reviewed with patient today and changes made to appropriate areas of the chart.   Review of Systems  Constitutional: Negative.   HENT:  Positive for hearing loss. Negative for congestion, ear discharge, ear pain, nosebleeds, sinus pain, sore throat and tinnitus.        Rhinorrhea  Eyes: Negative.   Respiratory: Negative.  Negative for stridor.   Cardiovascular:  Positive for palpitations. Negative for chest pain, orthopnea, claudication, leg swelling and PND.  Gastrointestinal:  Positive for constipation, heartburn and nausea. Negative for abdominal pain, blood in stool, diarrhea, melena and vomiting.  Genitourinary: Negative.   Musculoskeletal: Negative.   Skin: Negative.   Neurological: Negative.   Endo/Heme/Allergies:  Positive for environmental allergies. Negative for polydipsia. Does not bruise/bleed easily.  Psychiatric/Behavioral: Negative.      All other ROS negative except what is listed above and in the HPI.      Objective:    BP 102/69 (BP Location: Left Arm, Patient Position: Sitting, Cuff Size: Large)   Pulse (!) 112   Temp 98.4 F (36.9 C) (Oral)    Resp 17   Ht 5' 4.76" (1.645 m)   Wt 228 lb (103.4 kg)   LMP  (LMP Unknown)   SpO2 98%   BMI 38.22 kg/m   Wt Readings from Last 3 Encounters:  01/03/24 228 lb (103.4 kg)  02/28/23 224 lb 8 oz (101.8 kg)  07/11/22 216 lb 6.4 oz (98.2 kg)    No results found.  Physical Exam Vitals and nursing note reviewed.  Constitutional:      General: She is not in acute distress.    Appearance: Normal appearance. She is obese. She is not ill-appearing, toxic-appearing or diaphoretic.  HENT:     Head: Normocephalic and atraumatic.     Right Ear: Tympanic membrane, ear canal and external ear normal. There is no impacted cerumen.     Left Ear: Tympanic membrane, ear canal and external ear normal. There is no impacted cerumen.     Nose: Nose normal. No congestion or rhinorrhea.     Mouth/Throat:     Mouth: Mucous  membranes are moist.     Pharynx: Oropharynx is clear. No oropharyngeal exudate or posterior oropharyngeal erythema.  Eyes:     General: No scleral icterus.       Right eye: No discharge.        Left eye: No discharge.     Extraocular Movements: Extraocular movements intact.     Conjunctiva/sclera: Conjunctivae normal.     Pupils: Pupils are equal, round, and reactive to light.  Neck:     Vascular: No carotid bruit.  Cardiovascular:     Rate and Rhythm: Normal rate and regular rhythm.     Pulses: Normal pulses.     Heart sounds: No murmur heard.    No friction rub. No gallop.  Pulmonary:     Effort: Pulmonary effort is normal. No respiratory distress.     Breath sounds: Normal breath sounds. No stridor. No wheezing, rhonchi or rales.  Chest:     Chest wall: No tenderness.  Abdominal:     General: Abdomen is flat. Bowel sounds are normal. There is no distension.     Palpations: Abdomen is soft. There is no mass.     Tenderness: There is no abdominal tenderness. There is no right CVA tenderness, left CVA tenderness, guarding or rebound.     Hernia: No hernia is present.   Genitourinary:    Comments: Breast and pelvic exams deferred with shared decision making Musculoskeletal:        General: No swelling, tenderness, deformity or signs of injury.     Cervical back: Normal range of motion and neck supple. No rigidity. No muscular tenderness.     Right lower leg: No edema.     Left lower leg: No edema.  Lymphadenopathy:     Cervical: No cervical adenopathy.  Skin:    General: Skin is warm and dry.     Capillary Refill: Capillary refill takes less than 2 seconds.     Coloration: Skin is not jaundiced or pale.     Findings: No bruising, erythema, lesion or rash.  Neurological:     General: No focal deficit present.     Mental Status: She is alert and oriented to person, place, and time. Mental status is at baseline.     Cranial Nerves: No cranial nerve deficit.     Sensory: No sensory deficit.     Motor: No weakness.     Coordination: Coordination normal.     Gait: Gait normal.     Deep Tendon Reflexes: Reflexes normal.  Psychiatric:        Mood and Affect: Mood normal.        Behavior: Behavior normal.        Thought Content: Thought content normal.        Judgment: Judgment normal.        01/03/2024    1:27 PM  6CIT Screen  What Year? 0 points  What month? 0 points  What time? 0 points  Count back from 20 0 points  Months in reverse 0 points  Repeat phrase 0 points  Total Score 0 points    Results for orders placed or performed in visit on 01/03/24  Microalbumin, Urine Waived   Collection Time: 01/03/24  2:36 PM  Result Value Ref Range   Microalb, Ur Waived 30 (H) 0 - 19 mg/L   Creatinine, Urine Waived 100 10 - 300 mg/dL   Microalb/Creat Ratio <30 <30 mg/g  Bayer DCA Hb A1c Waived   Collection Time:  01/03/24  2:36 PM  Result Value Ref Range   HB A1C (BAYER DCA - WAIVED) 5.0 4.8 - 5.6 %  CBC with Differential/Platelet   Collection Time: 01/03/24  2:37 PM  Result Value Ref Range   WBC 7.3 3.4 - 10.8 x10E3/uL   RBC 4.69 3.77 -  5.28 x10E6/uL   Hemoglobin 14.0 11.1 - 15.9 g/dL   Hematocrit 41.3 24.4 - 46.6 %   MCV 90 79 - 97 fL   MCH 29.9 26.6 - 33.0 pg   MCHC 33.0 31.5 - 35.7 g/dL   RDW 01.0 27.2 - 53.6 %   Platelets 300 150 - 450 x10E3/uL   Neutrophils 68 Not Estab. %   Lymphs 22 Not Estab. %   Monocytes 8 Not Estab. %   Eos 1 Not Estab. %   Basos 1 Not Estab. %   Neutrophils Absolute 4.9 1.4 - 7.0 x10E3/uL   Lymphocytes Absolute 1.6 0.7 - 3.1 x10E3/uL   Monocytes Absolute 0.6 0.1 - 0.9 x10E3/uL   EOS (ABSOLUTE) 0.1 0.0 - 0.4 x10E3/uL   Basophils Absolute 0.0 0.0 - 0.2 x10E3/uL   Immature Granulocytes 0 Not Estab. %   Immature Grans (Abs) 0.0 0.0 - 0.1 x10E3/uL  Comprehensive metabolic panel   Collection Time: 01/03/24  2:37 PM  Result Value Ref Range   Glucose 75 70 - 99 mg/dL   BUN 25 (H) 6 - 24 mg/dL   Creatinine, Ser 6.44 0.57 - 1.00 mg/dL   eGFR 034 >74 QV/ZDG/3.87   BUN/Creatinine Ratio 40 (H) 9 - 23   Sodium 137 134 - 144 mmol/L   Potassium 4.0 3.5 - 5.2 mmol/L   Chloride 100 96 - 106 mmol/L   CO2 23 20 - 29 mmol/L   Calcium 9.3 8.7 - 10.2 mg/dL   Total Protein 6.8 6.0 - 8.5 g/dL   Albumin 4.7 3.8 - 4.9 g/dL   Globulin, Total 2.1 1.5 - 4.5 g/dL   Bilirubin Total 0.2 0.0 - 1.2 mg/dL   Alkaline Phosphatase 97 44 - 121 IU/L   AST 20 0 - 40 IU/L   ALT 23 0 - 32 IU/L  Lipid Panel w/o Chol/HDL Ratio   Collection Time: 01/03/24  2:37 PM  Result Value Ref Range   Cholesterol, Total 245 (H) 100 - 199 mg/dL   Triglycerides 564 0 - 149 mg/dL   HDL 73 >33 mg/dL   VLDL Cholesterol Cal 21 5 - 40 mg/dL   LDL Chol Calc (NIH) 295 (H) 0 - 99 mg/dL  TSH   Collection Time: 01/03/24  2:37 PM  Result Value Ref Range   TSH 1.060 0.450 - 4.500 uIU/mL  VITAMIN D 25 Hydroxy (Vit-D Deficiency, Fractures)   Collection Time: 01/03/24  2:37 PM  Result Value Ref Range   Vit D, 25-Hydroxy 27.3 (L) 30.0 - 100.0 ng/mL      Assessment & Plan:   Problem List Items Addressed This Visit       Cardiovascular  and Mediastinum   Paroxysmal supraventricular tachycardia (HCC)   Under good control on current regimen. Continue current regimen. Continue to monitor. Call with any concerns. Refills given. Labs drawn today.        Relevant Medications   propranolol (INDERAL) 40 MG tablet   Other Relevant Orders   Comprehensive metabolic panel (Completed)   Hypertension   Under good control on current regimen. Continue current regimen. Continue to monitor. Call with any concerns. Refills given. Labs drawn today.  Relevant Medications   propranolol (INDERAL) 40 MG tablet   Other Relevant Orders   Microalbumin, Urine Waived (Completed)   Comprehensive metabolic panel (Completed)     Respiratory   Asthma   Under good control on current regimen. Continue current regimen. Continue to monitor. Call with any concerns. Refills given. Labs drawn today.        Relevant Medications   albuterol (PROVENTIL) (2.5 MG/3ML) 0.083% nebulizer solution   albuterol (VENTOLIN HFA) 108 (90 Base) MCG/ACT inhaler   Other Relevant Orders   Comprehensive metabolic panel (Completed)     Other   Iron deficiency anemia   Checking labs today. Await results. Treat as needed.       Relevant Medications   folic acid (FOLVITE) 1 MG tablet   Other Relevant Orders   CBC with Differential/Platelet (Completed)   Comprehensive metabolic panel (Completed)   Anxiety   Continue to follow with psychiatry. Call with any concerns. Continue to monitor.       Relevant Orders   Comprehensive metabolic panel (Completed)   TSH (Completed)   History of Roux-en-Y gastric bypass   Checking labs today. Await results. Treat as needed.       Vitamin D deficiency   Checking labs today. Await results. Treat as needed.       Relevant Orders   Comprehensive metabolic panel (Completed)   VITAMIN D 25 Hydroxy (Vit-D Deficiency, Fractures) (Completed)   Mild episode of recurrent major depressive disorder (HCC)   Continue to  follow with psychiatry. Call with any concerns. Continue to monitor.       Relevant Orders   Comprehensive metabolic panel (Completed)   Other Visit Diagnoses       Encounter for initial annual wellness visit in Medicare patient    -  Primary   Vaccines up to date/declined. Screening labs checked today. Pap N/A. Mammo and colonoscopy up to date. DEXA ordered. Continue diet and exercise. Call w concerns   Relevant Orders   EKG 12-Lead     Screening for diabetes mellitus       Labs drawn today. Await results. Treat as needed.   Relevant Orders   Bayer DCA Hb A1c Waived (Completed)     Screening for cardiovascular condition       EKG normal today. Await results.   Relevant Orders   EKG 12-Lead   Lipid Panel w/o Chol/HDL Ratio (Completed)     Postmenopausal estrogen deficiency       DEXA ordered. Await results.   Relevant Orders   DG Bone Density        Preventative Services:  AAA screening: N/A Health Risk Assessment and Personalized Prevention Plan: Done today Bone Mass Measurements: Ordered today Breast Cancer Screening: Up to date CVD Screening: Done today Cervical Cancer Screening: N/A Colon Cancer Screening: up to date Depression Screening: done today Diabetes Screening: done today Glaucoma Screening: see your eye doctor Hepatitis B vaccine: N/A Hepatitis C screening: up to date HIV Screening: up to date Flu Vaccine:Declined Lung cancer Screening: N/A Obesity Screening: Done today Pneumonia Vaccines (2): Declined STI Screening: N/A  Follow up plan: Return in 6 months (on 07/05/2024).   LABORATORY TESTING:  - Pap smear: not applicable  IMMUNIZATIONS:   - Tdap: Tetanus vaccination status reviewed: last tetanus booster within 10 years. - Influenza: Refused - Pneumovax: Refused - Prevnar: Refused - Zostavax vaccine: Given elsewhere  SCREENING: -Mammogram: Up to date  - Colonoscopy: Up to date  - Bone Density: Not applicable  -  Hearing Test: Up to date    PATIENT COUNSELING:   Advised to take 1 mg of folate supplement per day if capable of pregnancy.   Sexuality: Discussed sexually transmitted diseases, partner selection, use of condoms, avoidance of unintended pregnancy  and contraceptive alternatives.   Advised to avoid cigarette smoking.  I discussed with the patient that most people either abstain from alcohol or drink within safe limits (<=14/week and <=4 drinks/occasion for males, <=7/weeks and <= 3 drinks/occasion for females) and that the risk for alcohol disorders and other health effects rises proportionally with the number of drinks per week and how often a drinker exceeds daily limits.  Discussed cessation/primary prevention of drug use and availability of treatment for abuse.   Diet: Encouraged to adjust caloric intake to maintain  or achieve ideal body weight, to reduce intake of dietary saturated fat and total fat, to limit sodium intake by avoiding high sodium foods and not adding table salt, and to maintain adequate dietary potassium and calcium preferably from fresh fruits, vegetables, and low-fat dairy products.    stressed the importance of regular exercise  Injury prevention: Discussed safety belts, safety helmets, smoke detector, smoking near bedding or upholstery.   Dental health: Discussed importance of regular tooth brushing, flossing, and dental visits.    NEXT PREVENTATIVE PHYSICAL DUE IN 1 YEAR. Return in 6 months (on 07/05/2024).

## 2024-01-03 NOTE — Progress Notes (Signed)
 Subjective:    Nancy Sanchez is a 53 y.o. female who presents for a Welcome to Medicare exam.          Objective:    There were no vitals filed for this visit.There is no height or weight on file to calculate BMI.  Medications Outpatient Encounter Medications as of 01/03/2024  Medication Sig   acetaminophen (TYLENOL) 650 MG CR tablet Take by mouth.   albuterol (PROVENTIL) (2.5 MG/3ML) 0.083% nebulizer solution Take 3 mLs (2.5 mg total) by nebulization every 6 (six) hours as needed for wheezing or shortness of breath.   albuterol (VENTOLIN HFA) 108 (90 Base) MCG/ACT inhaler INHALE ONE TO TWO PUFFS BY MOUTH 4 TIMES A DAY WHEN NEEDED   ascorbic acid (VITAMIN C) 500 MG tablet Take by mouth.   Biotin 1 MG CAPS Take by mouth.   budesonide-formoterol (SYMBICORT) 160-4.5 MCG/ACT inhaler Inhale 2 puffs into the lungs 2 (two) times daily.   buPROPion (WELLBUTRIN XL) 150 MG 24 hr tablet Take 150 mg by mouth every morning.   buPROPion (WELLBUTRIN XL) 300 MG 24 hr tablet Take 1 tablet (300 mg total) by mouth daily.   busPIRone (BUSPAR) 5 MG tablet Take 1 tablet (5 mg total) by mouth as needed (Take twice a day as needed for increased anxiety.).   calcium carbonate (OSCAL) 1500 (600 Ca) MG TABS tablet Take by mouth daily.    Calcium-Magnesium-Zinc 333-133-5 MG TABS Take 1 tablet by mouth daily.   cyclobenzaprine (FLEXERIL) 5 MG tablet Take 1 tablet (5 mg total) by mouth 2 (two) times daily as needed for muscle spasms.   diclofenac Sodium (VOLTAREN) 1 % GEL Apply 4 g topically 4 (four) times daily.   ferrous sulfate 324 (65 Fe) MG TBEC Take by mouth daily.   fexofenadine (ALLEGRA) 180 MG tablet Take 1 tablet (180 mg total) by mouth daily.   FLUoxetine (PROZAC) 20 MG capsule Take 20 mg by mouth daily.   fluticasone (CUTIVATE) 0.005 % ointment Apply topically as needed.    fluticasone (FLONASE) 50 MCG/ACT nasal spray Place into the nose as needed.    folic acid (FOLVITE) 1 MG tablet Take 1  mg by mouth daily.   gabapentin (NEURONTIN) 300 MG capsule Take 2 capsules (600 mg total) by mouth 2 (two) times daily.   ipratropium (ATROVENT) 0.03 % nasal spray Place 2 sprays into both nostrils every 12 (twelve) hours.   levocetirizine (XYZAL) 5 MG tablet Take 1 tablet (5 mg total) by mouth every evening.   magnesium oxide (MAG-OX) 400 MG tablet Take 400 mg by mouth 2 (two) times daily.   meclizine (ANTIVERT) 25 MG tablet Take by mouth as needed.    montelukast (SINGULAIR) 10 MG tablet Take 1 tablet (10 mg total) by mouth daily.   mupirocin ointment (BACTROBAN) 2 % Place 1 application into the nose 2 (two) times daily.   nystatin (MYCOSTATIN/NYSTOP) powder Apply topically 3 (three) times daily.   nystatin cream (MYCOSTATIN) Apply 1 Application topically 2 (two) times daily.   Omega-3 1000 MG CAPS Take by mouth.   omeprazole (PRILOSEC) 40 MG capsule TAKE ONE CAPSULE BY MOUTH TWICE DAILY BEFORE A MEAL   ondansetron (ZOFRAN ODT) 4 MG disintegrating tablet Take 1 tablet (4 mg total) by mouth every 8 (eight) hours as needed for nausea or vomiting.   Phentermine-Topiramate (QSYMIA) 3.75-23 MG CP24 Take 1 capsule by mouth daily.   predniSONE (DELTASONE) 10 MG tablet 6 tabs in the AM with food days  1 and 2, 5 tabs days 3 and 4, decrease by 1 every other day until gone.   propranolol (INDERAL) 40 MG tablet Take 1 tablet (40 mg total) by mouth 2 (two) times daily.   QUEtiapine (SEROQUEL) 25 MG tablet Take 25 mg by mouth at bedtime.   rizatriptan (MAXALT) 10 MG tablet TAKE ONE TABLET BY MOUTH AT ONSET OF HEADACHE; MAY REPEAT ONE TABLET IN 2 HOURS IF NEEDED.   silver sulfADIAZINE (SILVADENE) 1 % cream Apply topically.   triamcinolone cream (KENALOG) 0.1 % Apply 1 Application topically 2 (two) times daily.   TURMERIC PO Take by mouth daily.    UNABLE TO FIND Take by mouth daily. Part cherry capsule   No facility-administered encounter medications on file as of 01/03/2024.     History: Past Medical  History:  Diagnosis Date   Abscess of abdominal wall 11/06/2017   Abscess of right foot 07/18/2017   ADHD    Allergy    Anemia    Anxiety    Arthritis    Asthma    Barrett's esophagus    Bulging lumbar disc    L5-S1   Clotting disorder (HCC)    heavy periods   Depression    Endometriosis    GERD (gastroesophageal reflux disease)    Hernia, hiatal    DDD   HPV in female    Hypertension    Infection of skin due to methicillin resistant Staphylococcus aureus (MRSA) 11/09/2017   Migraine    Osteoporosis    Past Surgical History:  Procedure Laterality Date   ARTHROSCOPIC REPAIR ACL Left    CARPAL TUNNEL RELEASE Bilateral    ESSURE TUBAL LIGATION     GASTRIC BYPASS     HEEL SPUR EXCISION Left    INGUINAL HERNIA REPAIR  12/18/2017   JOINT REPLACEMENT Left    knee   SHOULDER ARTHROSCOPY W/ LABRAL REPAIR  03/2020   TONSILLECTOMY     TOTAL ABDOMINAL HYSTERECTOMY  01/18/2023   TOTAL HIP ARTHROPLASTY  09/28/2020    Family History  Adopted: Yes  Problem Relation Age of Onset   Heart disease Father    Breast cancer Neg Hx    Social History   Occupational History   Not on file  Tobacco Use   Smoking status: Former    Current packs/day: 0.00    Average packs/day: 1 pack/day for 15.0 years (15.0 ttl pk-yrs)    Types: Cigarettes    Quit date: 10/23/1998    Years since quitting: 25.2   Smokeless tobacco: Never  Vaping Use   Vaping status: Never Used  Substance and Sexual Activity   Alcohol use: No   Drug use: No   Sexual activity: Yes    Birth control/protection: Surgical    Tobacco Counseling Counseling given: Not Answered   Immunizations and Health Maintenance Immunization History  Administered Date(s) Administered   Hepatitis B 01/02/2000, 02/02/2000, 09/06/2000   Influenza,inj,Quad PF,6+ Mos 06/18/2015, 07/29/2018, 07/31/2019, 12/09/2020   Influenza-Unspecified 07/30/2013, 08/27/2013, 07/24/2014, 07/25/2016, 08/02/2017, 07/31/2018   MMR 10/10/2002,  02/11/2004   Mumps 08/06/1974   Pneumococcal Polysaccharide-23 08/23/2012   Tdap 02/23/2010, 12/11/2013   Zoster Recombinant(Shingrix) 06/20/2021   Health Maintenance Due  Topic Date Due   Pneumococcal Vaccine 60-27 Years old (2 of 2 - PCV) 08/23/2013   Zoster Vaccines- Shingrix (2 of 2) 08/15/2021   INFLUENZA VACCINE  05/24/2023   COVID-19 Vaccine (1 - 2024-25 season) Never done   DTaP/Tdap/Td (3 - Td or Tdap) 12/12/2023  Activities of Daily Living     No data to display              Assessment:    Vision/Hearing screen No results found.   Goals   None    Depression Screen    02/28/2023    3:30 PM 07/11/2022    9:00 AM 03/14/2022    9:16 AM 12/19/2021    8:15 AM  PHQ 2/9 Scores  PHQ - 2 Score 3 4 3 4   PHQ- 9 Score 14 14 15 15      Fall Risk    02/28/2023    3:29 PM  Fall Risk   Falls in the past year? 0  Number falls in past yr: 0  Injury with Fall? 0  Risk for fall due to : No Fall Risks  Follow up Falls evaluation completed    Cognitive Function:        Patient Care Team: Dorcas Carrow, DO as PCP - General (Family Medicine)     Plan:   I have personally reviewed and noted the following in the patient's chart:   Medical and social history Use of alcohol, tobacco or illicit drugs  Current medications and supplements Functional ability and status Nutritional status Physical activity Advanced directives List of other physicians Hospitalizations, surgeries, and ER visits in previous 12 months Vitals Screenings to include cognitive, depression, and falls Referrals and appointments  In addition, I have reviewed and discussed with patient certain preventive protocols, quality metrics, and best practice recommendations. A written personalized care plan for preventive services as well as general preventive health recommendations were provided to patient.     Olevia Perches, DO 01/03/2024

## 2024-01-04 DIAGNOSIS — M5412 Radiculopathy, cervical region: Secondary | ICD-10-CM | POA: Diagnosis not present

## 2024-01-04 LAB — COMPREHENSIVE METABOLIC PANEL
ALT: 23 IU/L (ref 0–32)
AST: 20 IU/L (ref 0–40)
Albumin: 4.7 g/dL (ref 3.8–4.9)
Alkaline Phosphatase: 97 IU/L (ref 44–121)
BUN/Creatinine Ratio: 40 — ABNORMAL HIGH (ref 9–23)
BUN: 25 mg/dL — ABNORMAL HIGH (ref 6–24)
Bilirubin Total: 0.2 mg/dL (ref 0.0–1.2)
CO2: 23 mmol/L (ref 20–29)
Calcium: 9.3 mg/dL (ref 8.7–10.2)
Chloride: 100 mmol/L (ref 96–106)
Creatinine, Ser: 0.62 mg/dL (ref 0.57–1.00)
Globulin, Total: 2.1 g/dL (ref 1.5–4.5)
Glucose: 75 mg/dL (ref 70–99)
Potassium: 4 mmol/L (ref 3.5–5.2)
Sodium: 137 mmol/L (ref 134–144)
Total Protein: 6.8 g/dL (ref 6.0–8.5)
eGFR: 107 mL/min/{1.73_m2} (ref >59)

## 2024-01-04 LAB — CBC WITH DIFFERENTIAL/PLATELET
Basophils Absolute: 0 10*3/uL (ref 0.0–0.2)
Basos: 1 %
EOS (ABSOLUTE): 0.1 10*3/uL (ref 0.0–0.4)
Eos: 1 %
Hematocrit: 42.4 % (ref 34.0–46.6)
Hemoglobin: 14 g/dL (ref 11.1–15.9)
Immature Grans (Abs): 0 10*3/uL (ref 0.0–0.1)
Immature Granulocytes: 0 %
Lymphocytes Absolute: 1.6 10*3/uL (ref 0.7–3.1)
Lymphs: 22 %
MCH: 29.9 pg (ref 26.6–33.0)
MCHC: 33 g/dL (ref 31.5–35.7)
MCV: 90 fL (ref 79–97)
Monocytes Absolute: 0.6 10*3/uL (ref 0.1–0.9)
Monocytes: 8 %
Neutrophils Absolute: 4.9 10*3/uL (ref 1.4–7.0)
Neutrophils: 68 %
Platelets: 300 10*3/uL (ref 150–450)
RBC: 4.69 x10E6/uL (ref 3.77–5.28)
RDW: 12 % (ref 11.7–15.4)
WBC: 7.3 10*3/uL (ref 3.4–10.8)

## 2024-01-04 LAB — VITAMIN D 25 HYDROXY (VIT D DEFICIENCY, FRACTURES): Vit D, 25-Hydroxy: 27.3 ng/mL — ABNORMAL LOW (ref 30.0–100.0)

## 2024-01-04 LAB — LIPID PANEL W/O CHOL/HDL RATIO
Cholesterol, Total: 245 mg/dL — ABNORMAL HIGH (ref 100–199)
HDL: 73 mg/dL (ref >39)
LDL Chol Calc (NIH): 151 mg/dL — ABNORMAL HIGH (ref 0–99)
Triglycerides: 118 mg/dL (ref 0–149)
VLDL Cholesterol Cal: 21 mg/dL (ref 5–40)

## 2024-01-04 LAB — TSH: TSH: 1.06 u[IU]/mL (ref 0.450–4.500)

## 2024-01-06 ENCOUNTER — Encounter: Payer: Self-pay | Admitting: Family Medicine

## 2024-01-06 MED ORDER — IPRATROPIUM BROMIDE 0.03 % NA SOLN: 2.0000 | Freq: Two times a day (BID) | NASAL | 2 refills | Status: DC

## 2024-01-06 MED ORDER — RIZATRIPTAN BENZOATE 10 MG PO TABS: ORAL_TABLET | ORAL | 6 refills | Status: DC

## 2024-01-06 MED ORDER — FEXOFENADINE HCL 180 MG PO TABS: 180.0000 mg | ORAL_TABLET | Freq: Every day | ORAL | 3 refills | Status: DC

## 2024-01-06 MED ORDER — FLUTICASONE PROPIONATE 50 MCG/ACT NA SUSP: 1.0000 | Freq: Every day | NASAL | 12 refills | Status: AC

## 2024-01-06 MED ORDER — NYSTATIN 100000 UNIT/GM EX CREA: 1.0000 | TOPICAL_CREAM | Freq: Two times a day (BID) | CUTANEOUS | 5 refills | Status: DC

## 2024-01-06 MED ORDER — FOLIC ACID 1 MG PO TABS: 1.0000 mg | ORAL_TABLET | Freq: Every day | ORAL | 3 refills | Status: DC

## 2024-01-06 MED ORDER — CYCLOBENZAPRINE HCL 5 MG PO TABS: 5.0000 mg | ORAL_TABLET | Freq: Two times a day (BID) | ORAL | 1 refills | Status: DC | PRN

## 2024-01-06 MED ORDER — ONDANSETRON 4 MG PO TBDP: 4.0000 mg | ORAL_TABLET | Freq: Three times a day (TID) | ORAL | 0 refills | Status: DC | PRN

## 2024-01-06 MED ORDER — ALBUTEROL SULFATE (2.5 MG/3ML) 0.083% IN NEBU: 2.5000 mg | INHALATION_SOLUTION | Freq: Four times a day (QID) | RESPIRATORY_TRACT | 6 refills | Status: DC | PRN

## 2024-01-06 MED ORDER — PROPRANOLOL HCL 40 MG PO TABS: 40.0000 mg | ORAL_TABLET | Freq: Two times a day (BID) | ORAL | 1 refills | Status: AC

## 2024-01-06 MED ORDER — MAGNESIUM OXIDE 400 MG PO TABS: 400.0000 mg | ORAL_TABLET | Freq: Two times a day (BID) | ORAL | 1 refills | Status: DC

## 2024-01-06 MED ORDER — ALBUTEROL SULFATE HFA 108 (90 BASE) MCG/ACT IN AERS
INHALATION_SPRAY | RESPIRATORY_TRACT | 3 refills | Status: DC
Start: 2024-01-06 — End: 2024-01-15

## 2024-01-06 MED ORDER — GABAPENTIN 300 MG PO CAPS: 600.0000 mg | ORAL_CAPSULE | Freq: Two times a day (BID) | ORAL | 1 refills | Status: DC

## 2024-01-06 MED ORDER — DICLOFENAC SODIUM 1 % EX GEL: 4.0000 g | Freq: Four times a day (QID) | CUTANEOUS | 1 refills | Status: DC

## 2024-01-06 MED ORDER — NYSTATIN 100000 UNIT/GM EX POWD: Freq: Three times a day (TID) | CUTANEOUS | 5 refills | Status: DC

## 2024-01-06 MED ORDER — LEVOCETIRIZINE DIHYDROCHLORIDE 5 MG PO TABS: 5.0000 mg | ORAL_TABLET | Freq: Every evening | ORAL | 3 refills | Status: DC

## 2024-01-06 NOTE — Assessment & Plan Note (Signed)
 Continue to follow with psychiatry. Call with any concerns. Continue to monitor.

## 2024-01-06 NOTE — Assessment & Plan Note (Signed)
 Checking labs today. Await results. Treat as needed.

## 2024-01-06 NOTE — Patient Instructions (Signed)
 Preventative Services:  AAA screening: N/A Health Risk Assessment and Personalized Prevention Plan: Done today Bone Mass Measurements: Ordered today Breast Cancer Screening: Up to date CVD Screening: Done today Cervical Cancer Screening: N/A Colon Cancer Screening: up to date Depression Screening: done today Diabetes Screening: done today Glaucoma Screening: see your eye doctor Hepatitis B vaccine: N/A Hepatitis C screening: up to date HIV Screening: up to date Flu Vaccine:Declined Lung cancer Screening: N/A Obesity Screening: Done today Pneumonia Vaccines (2): Declined STI Screening: N/A

## 2024-01-06 NOTE — Assessment & Plan Note (Signed)
 Under good control on current regimen. Continue current regimen. Continue to monitor. Call with any concerns. Refills given. Labs drawn today.

## 2024-01-09 DIAGNOSIS — F419 Anxiety disorder, unspecified: Secondary | ICD-10-CM | POA: Diagnosis not present

## 2024-01-09 DIAGNOSIS — F32A Depression, unspecified: Secondary | ICD-10-CM | POA: Diagnosis not present

## 2024-01-11 ENCOUNTER — Encounter: Payer: Self-pay | Admitting: Family Medicine

## 2024-01-14 ENCOUNTER — Encounter: Payer: Self-pay | Admitting: Family Medicine

## 2024-01-15 ENCOUNTER — Other Ambulatory Visit: Payer: Self-pay

## 2024-01-15 DIAGNOSIS — E669 Obesity, unspecified: Secondary | ICD-10-CM | POA: Diagnosis not present

## 2024-01-15 DIAGNOSIS — R7303 Prediabetes: Secondary | ICD-10-CM | POA: Diagnosis not present

## 2024-01-15 DIAGNOSIS — E785 Hyperlipidemia, unspecified: Secondary | ICD-10-CM | POA: Diagnosis not present

## 2024-01-15 DIAGNOSIS — Z9884 Bariatric surgery status: Secondary | ICD-10-CM | POA: Diagnosis not present

## 2024-01-15 DIAGNOSIS — M25551 Pain in right hip: Secondary | ICD-10-CM | POA: Diagnosis not present

## 2024-01-15 DIAGNOSIS — Z6841 Body Mass Index (BMI) 40.0 and over, adult: Secondary | ICD-10-CM | POA: Diagnosis not present

## 2024-01-15 DIAGNOSIS — G4733 Obstructive sleep apnea (adult) (pediatric): Secondary | ICD-10-CM | POA: Diagnosis not present

## 2024-01-15 DIAGNOSIS — F32A Depression, unspecified: Secondary | ICD-10-CM | POA: Diagnosis not present

## 2024-01-15 DIAGNOSIS — G4730 Sleep apnea, unspecified: Secondary | ICD-10-CM | POA: Diagnosis not present

## 2024-01-15 DIAGNOSIS — I10 Essential (primary) hypertension: Secondary | ICD-10-CM | POA: Diagnosis not present

## 2024-01-15 DIAGNOSIS — Z96641 Presence of right artificial hip joint: Secondary | ICD-10-CM | POA: Diagnosis not present

## 2024-01-15 DIAGNOSIS — K219 Gastro-esophageal reflux disease without esophagitis: Secondary | ICD-10-CM | POA: Diagnosis not present

## 2024-01-16 DIAGNOSIS — F419 Anxiety disorder, unspecified: Secondary | ICD-10-CM | POA: Diagnosis not present

## 2024-01-16 DIAGNOSIS — F32A Depression, unspecified: Secondary | ICD-10-CM | POA: Diagnosis not present

## 2024-01-16 MED ORDER — GABAPENTIN 300 MG PO CAPS: 600.0000 mg | ORAL_CAPSULE | Freq: Two times a day (BID) | ORAL | 1 refills | Status: DC

## 2024-01-16 MED ORDER — OMEPRAZOLE 40 MG PO CPDR: DELAYED_RELEASE_CAPSULE | ORAL | 1 refills | Status: DC

## 2024-01-16 MED ORDER — CYCLOBENZAPRINE HCL 5 MG PO TABS: 5.0000 mg | ORAL_TABLET | Freq: Two times a day (BID) | ORAL | 1 refills | Status: AC | PRN

## 2024-01-16 MED ORDER — FEXOFENADINE HCL 180 MG PO TABS: 180.0000 mg | ORAL_TABLET | Freq: Every day | ORAL | 3 refills | Status: AC

## 2024-01-16 MED ORDER — ALBUTEROL SULFATE (2.5 MG/3ML) 0.083% IN NEBU: 2.5000 mg | INHALATION_SOLUTION | Freq: Four times a day (QID) | RESPIRATORY_TRACT | 6 refills | Status: AC | PRN

## 2024-01-16 MED ORDER — NYSTATIN 100000 UNIT/GM EX CREA: 1.0000 | TOPICAL_CREAM | Freq: Two times a day (BID) | CUTANEOUS | 5 refills | Status: AC

## 2024-01-16 MED ORDER — MAGNESIUM OXIDE 400 MG PO TABS: 400.0000 mg | ORAL_TABLET | Freq: Two times a day (BID) | ORAL | 1 refills | Status: DC

## 2024-01-16 MED ORDER — IPRATROPIUM BROMIDE 0.03 % NA SOLN
2.0000 | Freq: Two times a day (BID) | NASAL | 2 refills | Status: DC
Start: 2024-01-16 — End: 2024-03-14

## 2024-01-16 MED ORDER — ALBUTEROL SULFATE HFA 108 (90 BASE) MCG/ACT IN AERS: INHALATION_SPRAY | RESPIRATORY_TRACT | 3 refills | Status: DC

## 2024-01-16 MED ORDER — ONDANSETRON 4 MG PO TBDP: 4.0000 mg | ORAL_TABLET | Freq: Three times a day (TID) | ORAL | 0 refills | Status: AC | PRN

## 2024-01-16 MED ORDER — FOLIC ACID 1 MG PO TABS: 1.0000 mg | ORAL_TABLET | Freq: Every day | ORAL | 3 refills | Status: AC

## 2024-01-16 MED ORDER — LEVOCETIRIZINE DIHYDROCHLORIDE 5 MG PO TABS
5.0000 mg | ORAL_TABLET | Freq: Every evening | ORAL | 3 refills | Status: AC
Start: 2024-01-16 — End: ?

## 2024-01-16 MED ORDER — RIZATRIPTAN BENZOATE 10 MG PO TABS: ORAL_TABLET | ORAL | 6 refills | Status: AC

## 2024-01-16 MED ORDER — DICLOFENAC SODIUM 1 % EX GEL: 4.0000 g | Freq: Four times a day (QID) | CUTANEOUS | 1 refills | Status: AC

## 2024-01-16 MED ORDER — NYSTATIN 100000 UNIT/GM EX POWD: Freq: Three times a day (TID) | CUTANEOUS | 5 refills | Status: AC

## 2024-01-21 DIAGNOSIS — F419 Anxiety disorder, unspecified: Secondary | ICD-10-CM | POA: Diagnosis not present

## 2024-01-21 DIAGNOSIS — F32A Depression, unspecified: Secondary | ICD-10-CM | POA: Diagnosis not present

## 2024-03-09 ENCOUNTER — Other Ambulatory Visit: Payer: Self-pay | Admitting: Family Medicine

## 2024-03-11 NOTE — Telephone Encounter (Signed)
 Requested Prescriptions  Pending Prescriptions Disp Refills   albuterol  (VENTOLIN  HFA) 108 (90 Base) MCG/ACT inhaler [Pharmacy Med Name: ALBUTEROL  HFA INH (200 PUFFS) 6.7GM] 8 g 1    Sig: INHALE 1 TO 2 PUFFS BY MOUTH FOUR TIMES DAILY AS NEEDED     Pulmonology:  Beta Agonists 2 Failed - 03/11/2024 11:38 AM      Failed - Last Heart Rate in normal range    Pulse Readings from Last 1 Encounters:  01/03/24 (!) 112         Passed - Last BP in normal range    BP Readings from Last 1 Encounters:  01/03/24 102/69         Passed - Valid encounter within last 12 months    Recent Outpatient Visits           2 months ago Encounter for initial annual wellness visit in Medicare patient   [facility] [place], [place], DO

## 2024-03-11 NOTE — Telephone Encounter (Signed)
 Requested medication (s) are due for refill today: na  Requested medication (s) are on the active medication list: yes  Last refill:  01/16/24 #30 ml 2 refills  Future visit scheduled: na  Notes to clinic:  medication not assigned to a protocol.   Pharmacy comment: REQUEST FOR 90 DAYS PRESCRIPTION.   Do you want to refill Rx?     Requested Prescriptions  Pending Prescriptions Disp Refills   ipratropium (ATROVENT ) 0.03 % nasal spray [Pharmacy Med Name: IPRATROPIUM 0.03% SPRAY]  1    Sig: Place 2 sprays into both nostrils every 12 (twelve) hours.     Off-Protocol Failed - 03/11/2024  3:58 PM      Failed - Medication not assigned to a protocol, review manually.      Passed - Valid encounter within last 12 months    Recent Outpatient Visits           2 months ago Encounter for initial annual wellness visit in Medicare patient   [employer] [facility] [place], [place], DO             Off-Protocol Failed - 03/11/2024  3:58 PM      Failed - Medication not assigned to a protocol, review manually.      Passed - Valid encounter within last 12 months    Recent Outpatient Visits           2 months ago Encounter for initial annual wellness visit in Medicare patient   [place] [facility] [place], [place], DO

## 2024-05-04 ENCOUNTER — Other Ambulatory Visit: Payer: Self-pay | Admitting: Family Medicine

## 2024-05-06 NOTE — Telephone Encounter (Signed)
 Requested medication (s) are due for refill today: yes  Requested medication (s) are on the active medication list:yes  Last refill:  03/14/24  Future visit scheduled: yes  Notes to clinic:   Medication not assigned to a protocol, review manually.      Requested Prescriptions  Pending Prescriptions Disp Refills   ipratropium (ATROVENT ) 0.03 % nasal spray [Pharmacy Med Name: IPRATROPIUM 0.03% SPRAY]  1    Sig: PLACE 2 SPRAYS INTO BOTH NOSTRILS EVERY 12 (TWELVE) HOURS.     Off-Protocol Failed - 05/06/2024 12:44 PM      Failed - Medication not assigned to a protocol, review manually.      Passed - Valid encounter within last 12 months    Recent Outpatient Visits           4 months ago Encounter for initial annual wellness visit in Medicare patient   [place] [facility] [place], [place], DO             Off-Protocol Failed - 05/06/2024 12:44 PM      Failed - Medication not assigned to a protocol, review manually.      Passed - Valid encounter within last 12 months    Recent Outpatient Visits           4 months ago Encounter for initial annual wellness visit in Medicare patient   [place] [facility] [place], [place], DO

## 2024-05-30 ENCOUNTER — Other Ambulatory Visit: Payer: Self-pay | Admitting: Family Medicine

## 2024-06-03 NOTE — Telephone Encounter (Signed)
 Requested Prescriptions  Pending Prescriptions Disp Refills   albuterol  (VENTOLIN  HFA) 108 (90 Base) MCG/ACT inhaler [Pharmacy Med Name: ALBUTEROL  HFA (PROAIR ) INHALER] 8.5 each 0    Sig: INHALE ONE TO TWO PUFFS BY MOUTH 4 TIMES A DAY WHEN NEEDED     Pulmonology:  Beta Agonists 2 Failed - 06/03/2024  8:16 AM      Failed - Last Heart Rate in normal range    Pulse Readings from Last 1 Encounters:  01/03/24 (!) 112         Passed - Last BP in normal range    BP Readings from Last 1 Encounters:  01/03/24 102/69         Passed - Valid encounter within last 12 months    Recent Outpatient Visits           5 months ago Encounter for initial annual wellness visit in Medicare patient   [place] [facility] [place], [place], DO

## 2024-06-28 ENCOUNTER — Other Ambulatory Visit: Payer: Self-pay | Admitting: Family Medicine

## 2024-06-30 NOTE — Telephone Encounter (Signed)
 Requested medications are due for refill today.  unsure  Requested medications are on the active medications list.  yes  Last refill. 06/03/2024 8.5  0 rf  Future visit scheduled.   no  Notes to clinic.  It appears that pt has transferred care to [facility].    Requested Prescriptions  Pending Prescriptions Disp Refills   albuterol  (VENTOLIN  HFA) 108 (90 Base) MCG/ACT inhaler [Pharmacy Med Name: ALBUTEROL  HFA (PROAIR ) INHALER] 8.5 each 0    Sig: INHALE ONE TO TWO PUFFS BY MOUTH 4 TIMES A DAY WHEN NEEDED     Pulmonology:  Beta Agonists 2 Failed - 06/30/2024 12:12 PM      Failed - Last Heart Rate in normal range    Pulse Readings from Last 1 Encounters:  01/03/24 (!) 112         Passed - Last BP in normal range    BP Readings from Last 1 Encounters:  01/03/24 102/69         Passed - Valid encounter within last 12 months    Recent Outpatient Visits           5 months ago Encounter for initial annual wellness visit in Medicare patient   [place] [facility] [place], [place], DO

## 2024-07-08 ENCOUNTER — Ambulatory Visit: Admitting: Family Medicine

## 2024-07-15 ENCOUNTER — Other Ambulatory Visit: Payer: Self-pay | Admitting: Family Medicine

## 2024-07-15 NOTE — Telephone Encounter (Signed)
 Requested Prescriptions  Pending Prescriptions Disp Refills   omeprazole  (PRILOSEC) 40 MG capsule [Pharmacy Med Name: OMEPRAZOLE  DR 40 MG CAPSULE] 180 capsule 1    Sig: TAKE ONE CAPSULE BY MOUTH TWICE DAILY BEFORE A MEAL     Gastroenterology: Proton Pump Inhibitors Passed - 07/15/2024  5:42 PM      Passed - Valid encounter within last 12 months    Recent Outpatient Visits           6 months ago Encounter for initial annual wellness visit in Medicare patient   [facility] [place], [place] P, DO               gabapentin  (NEURONTIN ) 300 MG capsule [Pharmacy Med Name: GABAPENTIN  300 MG CAPSULE] 360 capsule 1    Sig: TAKE 2 CAPSULES BY MOUTH 2 TIMES DAILY.     Neurology: Anticonvulsants - gabapentin  Passed - 07/15/2024  5:42 PM      Passed - Cr in normal range and within 360 days    Creatinine, Ser  Date Value Ref Range Status  01/03/2024 0.62 0.57 - 1.00 mg/dL Final         Passed - Completed PHQ-2 or PHQ-9 in the last 360 days      Passed - Valid encounter within last 12 months    Recent Outpatient Visits           6 months ago Encounter for initial annual wellness visit in Medicare patient   [place] [facility] [place], [place], DO

## 2024-07-19 ENCOUNTER — Other Ambulatory Visit: Payer: Self-pay | Admitting: Family Medicine

## 2024-07-22 NOTE — Telephone Encounter (Signed)
 Requested medication (s) are due for refill today: yes  Requested medication (s) are on the active medication list: yes  Last refill:  01/16/24 #180 1 RF  Future visit scheduled: no  Notes to clinic:  Mg level overdue   Requested Prescriptions  Pending Prescriptions Disp Refills   magnesium  oxide (MAG-OX) 400 MG tablet [Pharmacy Med Name: MAGNESIUM  OXIDE 400 MG TABLET] 180 tablet 1    Sig: TAKE 1 TABLET BY MOUTH TWICE A DAY     Endocrinology:  Minerals - Magnesium  Supplementation Failed - 07/22/2024 10:02 AM      Failed - Mg Level in normal range and within 360 days    Magnesium   Date Value Ref Range Status  06/13/2019 2.2 1.6 - 2.3 mg/dL Final         Passed - Cr in normal range and within 360 days    Creatinine, Ser  Date Value Ref Range Status  01/03/2024 0.62 0.57 - 1.00 mg/dL Final         Passed - Valid encounter within last 12 months    Recent Outpatient Visits           6 months ago Encounter for initial annual wellness visit in Medicare patient   [place] [facility] [place], [place], DO

## 2024-07-30 ENCOUNTER — Other Ambulatory Visit: Payer: Self-pay | Admitting: Family Medicine

## 2024-07-31 ENCOUNTER — Other Ambulatory Visit: Payer: Self-pay | Admitting: Family Medicine

## 2024-07-31 NOTE — Telephone Encounter (Signed)
 Requested Prescriptions  Pending Prescriptions Disp Refills   albuterol  (VENTOLIN  HFA) 108 (90 Base) MCG/ACT inhaler [Pharmacy Med Name: ALBUTEROL  HFA (PROAIR ) INHALER] 8.5 g 0    Sig: INHALE ONE TO TWO PUFFS BY MOUTH 4 TIMES A DAY WHEN NEEDED     Pulmonology:  Beta Agonists 2 Failed - 07/31/2024  2:44 PM      Failed - Last Heart Rate in normal range    Pulse Readings from Last 1 Encounters:  01/03/24 (!) 112         Passed - Last BP in normal range    BP Readings from Last 1 Encounters:  01/03/24 102/69         Passed - Valid encounter within last 12 months    Recent Outpatient Visits           7 months ago Encounter for initial annual wellness visit in Medicare patient   [place] [facility] [place], [place], DO

## 2024-08-01 NOTE — Telephone Encounter (Signed)
 Called patient and left a message for her to call back to get scheduled or find out if she has a new PCP

## 2024-08-01 NOTE — Telephone Encounter (Signed)
 Requested medications are due for refill today.  yes  Requested medications are on the active medications list.  yes  Last refill. 01/16/2024 #180 1 rf  Future visit scheduled.   no  Notes to clinic.  Refill not delegated.    Requested Prescriptions  Pending Prescriptions Disp Refills   cyclobenzaprine  (FLEXERIL ) 5 MG tablet [Pharmacy Med Name: CYCLOBENZAPRINE  5 MG TABLET] 180 tablet 1    Sig: TAKE 1 TABLET BY MOUTH 2 TIMES DAILY AS NEEDED FOR MUSCLE SPASMS.     Not Delegated - Analgesics:  Muscle Relaxants Failed - 08/01/2024  3:09 PM      Failed - This refill cannot be delegated      Failed - Valid encounter within last 6 months    Recent Outpatient Visits           7 months ago Encounter for initial annual wellness visit in Medicare patient    [facility] [place], [place], DO

## 2024-08-01 NOTE — Telephone Encounter (Signed)
 Overdue for follow up- ?established with [facility] in [place]

## 2024-08-13 NOTE — Telephone Encounter (Signed)
 Called patient and left a message for her to call back. Needs follow up or to see if she has a new pcp

## 2024-08-26 ENCOUNTER — Other Ambulatory Visit: Payer: Self-pay | Admitting: Family Medicine

## 2024-08-27 NOTE — Telephone Encounter (Signed)
 Requested Prescriptions  Pending Prescriptions Disp Refills   albuterol  (VENTOLIN  HFA) 108 (90 Base) MCG/ACT inhaler [Pharmacy Med Name: ALBUTEROL  HFA (PROAIR ) INHALER] 8.5 each 0    Sig: INHALE ONE TO TWO PUFFS BY MOUTH 4 TIMES A DAY WHEN NEEDED     Pulmonology:  Beta Agonists 2 Failed - 08/27/2024 11:43 AM      Failed - Last Heart Rate in normal range    Pulse Readings from Last 1 Encounters:  01/03/24 (!) 112         Passed - Last BP in normal range    BP Readings from Last 1 Encounters:  01/03/24 102/69         Passed - Valid encounter within last 12 months    Recent Outpatient Visits           7 months ago Encounter for initial annual wellness visit in Medicare patient   [place] [facility] [place], [place], DO

## 2024-09-27 ENCOUNTER — Other Ambulatory Visit: Payer: Self-pay | Admitting: Family Medicine

## 2024-10-22 ENCOUNTER — Other Ambulatory Visit: Payer: Self-pay | Admitting: Family Medicine

## 2024-10-24 ENCOUNTER — Other Ambulatory Visit: Payer: Self-pay | Admitting: Family Medicine

## 2024-10-24 NOTE — Telephone Encounter (Signed)
 Looks like she's now seeing [facility]- can we confirm this please

## 2024-10-25 ENCOUNTER — Other Ambulatory Visit: Payer: Self-pay | Admitting: Family Medicine

## 2024-10-27 NOTE — Telephone Encounter (Signed)
 Requested medication (s) are due for refill today:   Requested medication (s) are on the active medication list: Yes  Last refill:  09/29/24  Future visit scheduled: No  Notes to clinic:  Is pt. Still seen at the practice?    Requested Prescriptions  Pending Prescriptions Disp Refills   albuterol  (VENTOLIN  HFA) 108 (90 Base) MCG/ACT inhaler [Pharmacy Med Name: ALBUTEROL  HFA (PROAIR ) INHALER] 8.5 each 0    Sig: INHALE ONE TO TWO PUFFS BY MOUTH 4 TIMES A DAY WHEN NEEDED     Pulmonology:  Beta Agonists 2 Failed - 10/27/2024  2:05 PM      Failed - Last Heart Rate in normal range    Pulse Readings from Last 1 Encounters:  01/03/24 (!) 112         Passed - Last BP in normal range    BP Readings from Last 1 Encounters:  01/03/24 102/69         Passed - Valid encounter within last 12 months    Recent Outpatient Visits           9 months ago Encounter for initial annual wellness visit in Medicare patient   [place] [facility] [place], [place], DO

## 2024-10-30 ENCOUNTER — Other Ambulatory Visit: Payer: Self-pay | Admitting: Family Medicine
